# Patient Record
Sex: Female | Born: 1955 | Race: White | Hispanic: Yes | Marital: Married | Smoking: Never smoker
Health system: Southern US, Community
[De-identification: ages and names within clinical notes are randomized; demographics above are authoritative.]

## PROBLEM LIST (undated history)

## (undated) DIAGNOSIS — M069 Rheumatoid arthritis, unspecified: Secondary | ICD-10-CM

## (undated) DIAGNOSIS — I10 Essential (primary) hypertension: Secondary | ICD-10-CM

## (undated) DIAGNOSIS — I82409 Acute embolism and thrombosis of unspecified deep veins of unspecified lower extremity: Secondary | ICD-10-CM

## (undated) DIAGNOSIS — K219 Gastro-esophageal reflux disease without esophagitis: Secondary | ICD-10-CM

## (undated) DIAGNOSIS — F419 Anxiety disorder, unspecified: Secondary | ICD-10-CM

## (undated) DIAGNOSIS — E119 Type 2 diabetes mellitus without complications: Secondary | ICD-10-CM

## (undated) DIAGNOSIS — E7800 Pure hypercholesterolemia, unspecified: Secondary | ICD-10-CM

## (undated) HISTORY — PX: EYE SURGERY: SHX253

## (undated) HISTORY — PX: KNEE ARTHROPLASTY: SHX992

---

## 2004-05-17 ENCOUNTER — Ambulatory Visit (HOSPITAL_COMMUNITY): Admission: RE | Admit: 2004-05-17 | Discharge: 2004-05-17 | Payer: Self-pay | Admitting: Ophthalmology

## 2005-03-18 ENCOUNTER — Ambulatory Visit (HOSPITAL_COMMUNITY): Admission: RE | Admit: 2005-03-18 | Discharge: 2005-03-18 | Payer: Self-pay | Admitting: Family Medicine

## 2007-01-01 ENCOUNTER — Ambulatory Visit (HOSPITAL_COMMUNITY): Admission: RE | Admit: 2007-01-01 | Discharge: 2007-01-01 | Payer: Self-pay | Admitting: Family Medicine

## 2007-01-17 ENCOUNTER — Ambulatory Visit (HOSPITAL_COMMUNITY): Admission: RE | Admit: 2007-01-17 | Discharge: 2007-01-17 | Payer: Self-pay | Admitting: Family Medicine

## 2007-09-24 ENCOUNTER — Ambulatory Visit (HOSPITAL_COMMUNITY): Admission: RE | Admit: 2007-09-24 | Discharge: 2007-09-24 | Payer: Self-pay | Admitting: Family Medicine

## 2008-02-21 ENCOUNTER — Ambulatory Visit (HOSPITAL_COMMUNITY): Admission: RE | Admit: 2008-02-21 | Discharge: 2008-02-21 | Payer: Self-pay | Admitting: Family Medicine

## 2010-01-04 ENCOUNTER — Ambulatory Visit (HOSPITAL_COMMUNITY)
Admission: RE | Admit: 2010-01-04 | Discharge: 2010-01-04 | Payer: Self-pay | Source: Home / Self Care | Attending: Family Medicine | Admitting: Family Medicine

## 2010-01-25 ENCOUNTER — Encounter: Payer: Self-pay | Admitting: Family Medicine

## 2010-09-09 ENCOUNTER — Ambulatory Visit
Admission: RE | Admit: 2010-09-09 | Discharge: 2010-09-09 | Disposition: A | Discharge status: Home / Self Care | Payer: 59 | Source: Ambulatory Visit | Attending: Rheumatology | Admitting: Rheumatology

## 2010-09-09 ENCOUNTER — Other Ambulatory Visit: Payer: Self-pay | Admitting: Rheumatology

## 2010-09-09 DIAGNOSIS — M1990 Unspecified osteoarthritis, unspecified site: Secondary | ICD-10-CM

## 2013-02-15 ENCOUNTER — Other Ambulatory Visit (HOSPITAL_COMMUNITY): Payer: Self-pay | Admitting: Orthopedic Surgery

## 2013-02-22 ENCOUNTER — Encounter (HOSPITAL_COMMUNITY)
Admission: RE | Admit: 2013-02-22 | Discharge: 2013-02-22 | Disposition: A | Discharge status: Home / Self Care | Payer: 59 | Source: Ambulatory Visit | Attending: Orthopedic Surgery | Admitting: Orthopedic Surgery

## 2013-02-22 ENCOUNTER — Ambulatory Visit (HOSPITAL_COMMUNITY)
Admission: RE | Admit: 2013-02-22 | Discharge: 2013-02-22 | Disposition: A | Discharge status: Home / Self Care | Payer: 59 | Source: Ambulatory Visit | Attending: Orthopedic Surgery | Admitting: Orthopedic Surgery

## 2013-02-22 ENCOUNTER — Encounter (HOSPITAL_COMMUNITY): Payer: Self-pay

## 2013-02-22 DIAGNOSIS — Z01812 Encounter for preprocedural laboratory examination: Secondary | ICD-10-CM | POA: Insufficient documentation

## 2013-02-22 DIAGNOSIS — Z01818 Encounter for other preprocedural examination: Secondary | ICD-10-CM | POA: Insufficient documentation

## 2013-02-22 DIAGNOSIS — Z01810 Encounter for preprocedural cardiovascular examination: Secondary | ICD-10-CM | POA: Insufficient documentation

## 2013-02-22 HISTORY — DX: Essential (primary) hypertension: I10

## 2013-02-22 HISTORY — DX: Rheumatoid arthritis, unspecified: M06.9

## 2013-02-22 HISTORY — DX: Gastro-esophageal reflux disease without esophagitis: K21.9

## 2013-02-22 LAB — CBC
HCT: 35.1 % — ABNORMAL LOW (ref 36.0–46.0)
Hemoglobin: 11.5 g/dL — ABNORMAL LOW (ref 12.0–15.0)
MCH: 30.5 pg (ref 26.0–34.0)
MCHC: 32.8 g/dL (ref 30.0–36.0)
MCV: 93.1 fL (ref 78.0–100.0)
Platelets: 228 10*3/uL (ref 150–400)
RBC: 3.77 MIL/uL — ABNORMAL LOW (ref 3.87–5.11)
RDW: 14.1 % (ref 11.5–15.5)
WBC: 7.5 10*3/uL (ref 4.0–10.5)

## 2013-02-22 LAB — TYPE AND SCREEN
ABO/RH(D): O POS
Antibody Screen: NEGATIVE

## 2013-02-22 LAB — APTT: aPTT: 33 seconds (ref 24–37)

## 2013-02-22 LAB — COMPREHENSIVE METABOLIC PANEL
ALT: 16 U/L (ref 0–35)
AST: 17 U/L (ref 0–37)
Albumin: 3.6 g/dL (ref 3.5–5.2)
Alkaline Phosphatase: 107 U/L (ref 39–117)
BUN: 23 mg/dL (ref 6–23)
CO2: 26 mEq/L (ref 19–32)
Calcium: 9.4 mg/dL (ref 8.4–10.5)
Chloride: 103 mEq/L (ref 96–112)
Creatinine, Ser: 0.91 mg/dL (ref 0.50–1.10)
GFR calc Af Amer: 80 mL/min — ABNORMAL LOW (ref >90)
GFR calc non Af Amer: 69 mL/min — ABNORMAL LOW (ref >90)
Glucose, Bld: 110 mg/dL — ABNORMAL HIGH (ref 70–99)
Potassium: 3.9 mEq/L (ref 3.7–5.3)
Sodium: 143 mEq/L (ref 137–147)
Total Bilirubin: 0.2 mg/dL — ABNORMAL LOW (ref 0.3–1.2)
Total Protein: 7.3 g/dL (ref 6.0–8.3)

## 2013-02-22 LAB — PROTIME-INR
INR: 0.99 (ref 0.00–1.49)
Prothrombin Time: 12.9 seconds (ref 11.6–15.2)

## 2013-02-22 LAB — SURGICAL PCR SCREEN
MRSA, PCR: NEGATIVE
Staphylococcus aureus: NEGATIVE

## 2013-02-22 LAB — ABO/RH: ABO/RH(D): O POS

## 2013-02-22 NOTE — Progress Notes (Signed)
02/22/13 1423  OBSTRUCTIVE SLEEP APNEA  Have you ever been diagnosed with sleep apnea through a sleep study? No  Do you snore loudly (loud enough to be heard through closed doors)?  1  Do you often feel tired, fatigued, or sleepy during the daytime? 0  Has anyone observed you stop breathing during your sleep? 1  Do you have, or are you being treated for high blood pressure? 1  BMI more than 35 kg/m2? 1  Age over 58 years old? 1  Neck circumference greater than 40 cm/18 inches? 0  Gender: 0  Obstructive Sleep Apnea Score 5  Score 4 or greater  Results sent to PCP

## 2013-02-22 NOTE — Progress Notes (Signed)
Primary physician- dr. Earvin Hansen [facility] ([facility]) Ginette Otto Does not have a cardiologist No prior cardiac testing

## 2013-02-22 NOTE — Pre-Procedure Instructions (Signed)
Connie Ross  02/22/2013   Your procedure is scheduled on:  Wednesday, February 25th  Report to Admitting at 0630 AM.  Call this number if you have problems the morning of surgery: [phone]   Remember:   Do not eat food or drink liquids after midnight.   Take these medicines the morning of surgery with A SIP OF WATER:    Do not wear jewelry, make-up or nail polish.  Do not wear lotions, powders, or perfume,deodorant.  Do not shave 48 hours prior to surgery. Men may shave face and neck.  Do not bring valuables to the hospital.  [facility] is not responsible for any belongings or valuables.               Contacts, dentures or bridgework may not be worn into surgery.  Leave suitcase in the car. After surgery it may be brought to your room.  For patients admitted to the hospital, discharge time is determined by your  treatment team.    Please read over the following fact sheets that you were given: Pain Booklet, Coughing and Deep Breathing, Blood Transfusion Information, MRSA Information and Surgical Site Infection Prevention  [place] - Preparing for Surgery  Before surgery, you can play an important role.  Because skin is not sterile, your skin needs to be as free of germs as possible.  You can reduce the number of germs on you skin by washing with CHG (chlorahexidine gluconate) soap before surgery.  CHG is an antiseptic cleaner which kills germs and bonds with the skin to continue killing germs even after washing.  Please DO NOT use if you have an allergy to CHG or antibacterial soaps.  If your skin becomes reddened/irritated stop using the CHG and inform your nurse when you arrive at Short Stay.  Do not shave (including legs and underarms) for at least 48 hours prior to the first CHG shower.  You may shave your face.  Please follow these instructions carefully:   1.  Shower with CHG Soap the night before surgery and the morning of Surgery.  2.  If you choose to wash  your hair, wash your hair first as usual with your normal shampoo.  3.  After you shampoo, rinse your hair and body thoroughly to remove the shampoo.  4.  Use CHG as you would any other liquid soap.  You can apply CHG directly to the skin and wash gently with scrungie or a clean washcloth.  5.  Apply the CHG Soap to your body ONLY FROM THE NECK DOWN.  Do not use on open wounds or open sores.  Avoid contact with your eyes, ears, mouth and genitals (private parts).  Wash genitals (private parts) with your normal soap.  6.  Wash thoroughly, paying special attention to the area where your surgery will be performed.  7.  Thoroughly rinse your body with warm water from the neck down.  8.  DO NOT shower/wash with your normal soap after using and rinsing off the CHG Soap.  9.  Pat yourself dry with a clean towel.            10.  Wear clean pajamas.            11.  Place clean sheets on your bed the night of your first shower and do not sleep with pets.  Day of Surgery  Do not apply any lotions/deoderants the morning of surgery.  Please wear clean clothes to the hospital/surgery center.

## 2013-02-22 NOTE — Progress Notes (Signed)
To Whom It May Concern:  Connie Ross was here at [facility] Pre-Admission Testing from 2:00 PM to 3:00 PM today 02/22/13. She may return to work on 02/23/13 without any restrictions.  Ventura Sellers, RN [place] Pre-Admissions

## 2013-02-26 ENCOUNTER — Other Ambulatory Visit (HOSPITAL_COMMUNITY): Payer: 59

## 2013-02-26 MED ORDER — CEFAZOLIN SODIUM-DEXTROSE 2-3 GM-% IV SOLR
2.0000 g | INTRAVENOUS | Status: AC
  Administered 2013-02-27: 2 g via INTRAVENOUS
  Filled 2013-02-26: qty 50

## 2013-02-26 MED ORDER — CHLORHEXIDINE GLUCONATE 4 % EX LIQD
60.0000 mL | Freq: Once | CUTANEOUS | Status: DC
  Filled 2013-02-26: qty 60

## 2013-02-27 ENCOUNTER — Encounter (HOSPITAL_COMMUNITY): Payer: Self-pay | Admitting: *Deleted

## 2013-02-27 ENCOUNTER — Encounter (HOSPITAL_COMMUNITY)
Admission: RE | Disposition: A | Discharge status: Home Health | Payer: Self-pay | Source: Ambulatory Visit | Attending: Orthopedic Surgery

## 2013-02-27 ENCOUNTER — Encounter (HOSPITAL_COMMUNITY): Payer: 59 | Admitting: Anesthesiology

## 2013-02-27 ENCOUNTER — Inpatient Hospital Stay (HOSPITAL_COMMUNITY)
Admission: RE | Admit: 2013-02-27 | Discharge: 2013-03-02 | DRG: 470 | Disposition: A | Discharge status: Home Health | Payer: 59 | Source: Ambulatory Visit | Attending: Orthopedic Surgery | Admitting: Orthopedic Surgery

## 2013-02-27 ENCOUNTER — Inpatient Hospital Stay (HOSPITAL_COMMUNITY): Payer: 59 | Admitting: Anesthesiology

## 2013-02-27 DIAGNOSIS — Z7982 Long term (current) use of aspirin: Secondary | ICD-10-CM

## 2013-02-27 DIAGNOSIS — M069 Rheumatoid arthritis, unspecified: Secondary | ICD-10-CM | POA: Diagnosis present

## 2013-02-27 DIAGNOSIS — Z96653 Presence of artificial knee joint, bilateral: Secondary | ICD-10-CM

## 2013-02-27 DIAGNOSIS — I10 Essential (primary) hypertension: Secondary | ICD-10-CM | POA: Diagnosis present

## 2013-02-27 DIAGNOSIS — K219 Gastro-esophageal reflux disease without esophagitis: Secondary | ICD-10-CM | POA: Diagnosis present

## 2013-02-27 DIAGNOSIS — Z79899 Other long term (current) drug therapy: Secondary | ICD-10-CM

## 2013-02-27 DIAGNOSIS — E119 Type 2 diabetes mellitus without complications: Secondary | ICD-10-CM | POA: Diagnosis present

## 2013-02-27 DIAGNOSIS — Z96659 Presence of unspecified artificial knee joint: Secondary | ICD-10-CM

## 2013-02-27 DIAGNOSIS — M1710 Unilateral primary osteoarthritis, unspecified knee: Principal | ICD-10-CM | POA: Diagnosis present

## 2013-02-27 HISTORY — PX: TOTAL KNEE ARTHROPLASTY: SHX125

## 2013-02-27 HISTORY — DX: Type 2 diabetes mellitus without complications: E11.9

## 2013-02-27 HISTORY — DX: Pure hypercholesterolemia, unspecified: E78.00

## 2013-02-27 LAB — GLUCOSE, CAPILLARY
Glucose-Capillary: 101 mg/dL — ABNORMAL HIGH (ref 70–99)
Glucose-Capillary: 116 mg/dL — ABNORMAL HIGH (ref 70–99)
Glucose-Capillary: 130 mg/dL — ABNORMAL HIGH (ref 70–99)
Glucose-Capillary: 124 mg/dL — ABNORMAL HIGH (ref 70–99)

## 2013-02-27 SURGERY — ARTHROPLASTY, KNEE, TOTAL
Anesthesia: Regional | Site: Knee | Laterality: Right

## 2013-02-27 MED ORDER — SODIUM CHLORIDE 0.9 % IJ SOLN
INTRAMUSCULAR | Status: DC | PRN
  Administered 2013-02-27: 10:00:00

## 2013-02-27 MED ORDER — HYDROCHLOROTHIAZIDE 12.5 MG PO CAPS
12.5000 mg | ORAL_CAPSULE | Freq: Every day | ORAL | Status: DC
  Administered 2013-02-27 – 2013-03-02 (×4): 12.5 mg via ORAL
  Filled 2013-02-27 (×4): qty 1

## 2013-02-27 MED ORDER — OXYCODONE HCL 5 MG PO TABS
5.0000 mg | ORAL_TABLET | ORAL | Status: DC | PRN
  Administered 2013-02-27 – 2013-03-02 (×14): 10 mg via ORAL
  Filled 2013-02-27 (×14): qty 2

## 2013-02-27 MED ORDER — MENTHOL 3 MG MT LOZG: 1.0000 | LOZENGE | OROMUCOSAL | Status: DC | PRN

## 2013-02-27 MED ORDER — ONDANSETRON HCL 4 MG/2ML IJ SOLN: 4.0000 mg | Freq: Four times a day (QID) | INTRAMUSCULAR | Status: DC | PRN

## 2013-02-27 MED ORDER — BUPIVACAINE LIPOSOME 1.3 % IJ SUSP
20.0000 mL | INTRAMUSCULAR | Status: DC
Start: 2013-02-27 — End: 2013-02-27
  Filled 2013-02-27: qty 20

## 2013-02-27 MED ORDER — METFORMIN HCL 500 MG PO TABS
1000.0000 mg | ORAL_TABLET | Freq: Every day | ORAL | Status: DC
  Administered 2013-02-27 – 2013-03-02 (×4): 1000 mg via ORAL
  Filled 2013-02-27 (×5): qty 2

## 2013-02-27 MED ORDER — LACTATED RINGERS IV SOLN
INTRAVENOUS | Status: DC | PRN
  Administered 2013-02-27 (×2): via INTRAVENOUS

## 2013-02-27 MED ORDER — SUCCINYLCHOLINE CHLORIDE 20 MG/ML IJ SOLN
INTRAMUSCULAR | Status: AC
  Filled 2013-02-27: qty 1

## 2013-02-27 MED ORDER — MIDAZOLAM HCL 5 MG/5ML IJ SOLN
INTRAMUSCULAR | Status: DC | PRN
  Administered 2013-02-27 (×2): 1 mg via INTRAVENOUS

## 2013-02-27 MED ORDER — ONDANSETRON HCL 4 MG PO TABS: 4.0000 mg | ORAL_TABLET | Freq: Four times a day (QID) | ORAL | Status: DC | PRN

## 2013-02-27 MED ORDER — ARTIFICIAL TEARS OP OINT
TOPICAL_OINTMENT | OPHTHALMIC | Status: DC | PRN
  Administered 2013-02-27: 1 via OPHTHALMIC

## 2013-02-27 MED ORDER — MIDAZOLAM HCL 2 MG/2ML IJ SOLN
INTRAMUSCULAR | Status: AC
  Filled 2013-02-27: qty 2

## 2013-02-27 MED ORDER — PROPOFOL 10 MG/ML IV BOLUS
INTRAVENOUS | Status: DC | PRN
Start: 2013-02-27 — End: 2013-02-27
  Administered 2013-02-27: 180 mg via INTRAVENOUS

## 2013-02-27 MED ORDER — ROCURONIUM BROMIDE 100 MG/10ML IV SOLN
INTRAVENOUS | Status: DC | PRN
  Administered 2013-02-27: 50 mg via INTRAVENOUS

## 2013-02-27 MED ORDER — ACETAMINOPHEN 650 MG RE SUPP: 650.0000 mg | Freq: Four times a day (QID) | RECTAL | Status: DC | PRN

## 2013-02-27 MED ORDER — ONDANSETRON HCL 4 MG/2ML IJ SOLN
INTRAMUSCULAR | Status: AC
  Filled 2013-02-27: qty 2

## 2013-02-27 MED ORDER — ACETAMINOPHEN 325 MG PO TABS
650.0000 mg | ORAL_TABLET | Freq: Four times a day (QID) | ORAL | Status: DC | PRN
  Administered 2013-02-28 – 2013-03-01 (×2): 650 mg via ORAL
  Filled 2013-02-27 (×2): qty 2

## 2013-02-27 MED ORDER — SODIUM CHLORIDE 0.9 % IR SOLN
Status: DC | PRN
  Administered 2013-02-27 (×2): 1000 mL

## 2013-02-27 MED ORDER — BUPIVACAINE-EPINEPHRINE PF 0.5-1:200000 % IJ SOLN
INTRAMUSCULAR | Status: DC | PRN
  Administered 2013-02-27: 20 mL

## 2013-02-27 MED ORDER — OXYCODONE HCL 5 MG PO TABS
ORAL_TABLET | ORAL | Status: AC
  Filled 2013-02-27: qty 2

## 2013-02-27 MED ORDER — HYDROXYCHLOROQUINE SULFATE 200 MG PO TABS
200.0000 mg | ORAL_TABLET | Freq: Two times a day (BID) | ORAL | Status: DC
  Administered 2013-02-27 – 2013-03-02 (×7): 200 mg via ORAL
  Filled 2013-02-27 (×9): qty 1

## 2013-02-27 MED ORDER — HYDROMORPHONE HCL PF 1 MG/ML IJ SOLN
1.0000 mg | INTRAMUSCULAR | Status: DC | PRN
  Administered 2013-02-27 – 2013-03-01 (×4): 1 mg via INTRAVENOUS
  Filled 2013-02-27 (×4): qty 1

## 2013-02-27 MED ORDER — LOSARTAN POTASSIUM 50 MG PO TABS
50.0000 mg | ORAL_TABLET | Freq: Every day | ORAL | Status: DC
  Administered 2013-02-27 – 2013-03-02 (×4): 50 mg via ORAL
  Filled 2013-02-27 (×4): qty 1

## 2013-02-27 MED ORDER — SODIUM CHLORIDE 0.9 % IJ SOLN
INTRAMUSCULAR | Status: AC
  Filled 2013-02-27: qty 12

## 2013-02-27 MED ORDER — FENTANYL CITRATE 0.05 MG/ML IJ SOLN
INTRAMUSCULAR | Status: AC
  Filled 2013-02-27: qty 5

## 2013-02-27 MED ORDER — SODIUM CHLORIDE 0.9 % IV SOLN
INTRAVENOUS | Status: DC
  Administered 2013-02-27: 15:00:00 via INTRAVENOUS

## 2013-02-27 MED ORDER — NEOSTIGMINE METHYLSULFATE 1 MG/ML IJ SOLN
INTRAMUSCULAR | Status: AC
  Filled 2013-02-27: qty 10

## 2013-02-27 MED ORDER — HYDROMORPHONE HCL PF 1 MG/ML IJ SOLN
0.2500 mg | INTRAMUSCULAR | Status: DC | PRN
  Administered 2013-02-27 (×4): 0.5 mg via INTRAVENOUS

## 2013-02-27 MED ORDER — CEFAZOLIN SODIUM 1-5 GM-% IV SOLN
1.0000 g | Freq: Four times a day (QID) | INTRAVENOUS | Status: AC
  Administered 2013-02-27 (×2): 1 g via INTRAVENOUS
  Filled 2013-02-27 (×2): qty 50

## 2013-02-27 MED ORDER — PANTOPRAZOLE SODIUM 40 MG PO TBEC
40.0000 mg | DELAYED_RELEASE_TABLET | Freq: Every day | ORAL | Status: DC
  Administered 2013-02-27 – 2013-03-02 (×4): 40 mg via ORAL
  Filled 2013-02-27 (×4): qty 1

## 2013-02-27 MED ORDER — GLYCOPYRROLATE 0.2 MG/ML IJ SOLN
INTRAMUSCULAR | Status: DC | PRN
  Administered 2013-02-27: 0.4 mg via INTRAVENOUS

## 2013-02-27 MED ORDER — ROCURONIUM BROMIDE 50 MG/5ML IV SOLN
INTRAVENOUS | Status: AC
  Filled 2013-02-27: qty 1

## 2013-02-27 MED ORDER — LACTATED RINGERS IV SOLN: INTRAVENOUS | Status: DC

## 2013-02-27 MED ORDER — PROPOFOL 10 MG/ML IV BOLUS
INTRAVENOUS | Status: AC
  Filled 2013-02-27: qty 20

## 2013-02-27 MED ORDER — ONDANSETRON HCL 4 MG/2ML IJ SOLN
INTRAMUSCULAR | Status: DC | PRN
  Administered 2013-02-27: 4 mg via INTRAVENOUS

## 2013-02-27 MED ORDER — EPHEDRINE SULFATE 50 MG/ML IJ SOLN
INTRAMUSCULAR | Status: AC
  Filled 2013-02-27: qty 1

## 2013-02-27 MED ORDER — NEOSTIGMINE METHYLSULFATE 1 MG/ML IJ SOLN
INTRAMUSCULAR | Status: DC | PRN
  Administered 2013-02-27: 3 mg via INTRAVENOUS

## 2013-02-27 MED ORDER — PHENYLEPHRINE 40 MCG/ML (10ML) SYRINGE FOR IV PUSH (FOR BLOOD PRESSURE SUPPORT)
PREFILLED_SYRINGE | INTRAVENOUS | Status: AC
  Filled 2013-02-27: qty 10

## 2013-02-27 MED ORDER — ARTIFICIAL TEARS OP OINT
TOPICAL_OINTMENT | OPHTHALMIC | Status: AC
  Filled 2013-02-27: qty 3.5

## 2013-02-27 MED ORDER — GLYCOPYRROLATE 0.2 MG/ML IJ SOLN
INTRAMUSCULAR | Status: AC
  Filled 2013-02-27: qty 2

## 2013-02-27 MED ORDER — FENTANYL CITRATE 0.05 MG/ML IJ SOLN
INTRAMUSCULAR | Status: DC | PRN
  Administered 2013-02-27 (×3): 50 ug via INTRAVENOUS
  Administered 2013-02-27: 100 ug via INTRAVENOUS

## 2013-02-27 MED ORDER — HYDROMORPHONE HCL PF 1 MG/ML IJ SOLN
INTRAMUSCULAR | Status: AC
  Administered 2013-02-27: 0.5 mg via INTRAVENOUS
  Filled 2013-02-27: qty 2

## 2013-02-27 MED ORDER — LOSARTAN POTASSIUM-HCTZ 50-12.5 MG PO TABS: 1.0000 | ORAL_TABLET | Freq: Every day | ORAL | Status: DC

## 2013-02-27 MED ORDER — METOCLOPRAMIDE HCL 10 MG PO TABS: 5.0000 mg | ORAL_TABLET | Freq: Three times a day (TID) | ORAL | Status: DC | PRN

## 2013-02-27 MED ORDER — 0.9 % SODIUM CHLORIDE (POUR BTL) OPTIME
TOPICAL | Status: DC | PRN
  Administered 2013-02-27: 1000 mL

## 2013-02-27 MED ORDER — PHENOL 1.4 % MT LIQD: 1.0000 | OROMUCOSAL | Status: DC | PRN

## 2013-02-27 MED ORDER — LINAGLIPTIN 5 MG PO TABS
5.0000 mg | ORAL_TABLET | Freq: Every day | ORAL | Status: DC
  Administered 2013-02-27 – 2013-03-02 (×4): 5 mg via ORAL
  Filled 2013-02-27 (×5): qty 1

## 2013-02-27 MED ORDER — INSULIN ASPART 100 UNIT/ML ~~LOC~~ SOLN
0.0000 [IU] | Freq: Three times a day (TID) | SUBCUTANEOUS | Status: DC
  Administered 2013-02-27 – 2013-03-01 (×6): 2 [IU] via SUBCUTANEOUS

## 2013-02-27 MED ORDER — METOCLOPRAMIDE HCL 5 MG/ML IJ SOLN: 5.0000 mg | Freq: Three times a day (TID) | INTRAMUSCULAR | Status: DC | PRN

## 2013-02-27 MED ORDER — SAXAGLIPTIN-METFORMIN ER 2.5-1000 MG PO TB24: 1.0000 | ORAL_TABLET | Freq: Every day | ORAL | Status: DC

## 2013-02-27 MED ORDER — ASPIRIN EC 325 MG PO TBEC
325.0000 mg | DELAYED_RELEASE_TABLET | Freq: Every day | ORAL | Status: DC
  Administered 2013-02-28 – 2013-03-02 (×3): 325 mg via ORAL
  Filled 2013-02-27 (×4): qty 1

## 2013-02-27 MED ORDER — PROMETHAZINE HCL 25 MG/ML IJ SOLN: 6.2500 mg | INTRAMUSCULAR | Status: DC | PRN

## 2013-02-27 MED ORDER — MEPERIDINE HCL 25 MG/ML IJ SOLN: 6.2500 mg | INTRAMUSCULAR | Status: DC | PRN

## 2013-02-27 SURGICAL SUPPLY — 51 items
BANDAGE GAUZE ELAST BULKY 4 IN (GAUZE/BANDAGES/DRESSINGS) ×2 IMPLANT
BLADE SAGITTAL 25.0X1.27X90 (BLADE) ×2 IMPLANT
BLADE SAW SGTL 13.0X1.19X90.0M (BLADE) ×2 IMPLANT
BLADE SURG 21 STRL SS (BLADE) ×4 IMPLANT
BNDG COHESIVE 6X5 TAN STRL LF (GAUZE/BANDAGES/DRESSINGS) ×2 IMPLANT
BONE CEMENT PALACOSE (Orthopedic Implant) ×4 IMPLANT
BOWL SMART MIX CTS (DISPOSABLE) IMPLANT
CAP POR TM CP VIT E LN CER HD ×2 IMPLANT
CEMENT BONE PALACOSE (Orthopedic Implant) ×2 IMPLANT
CLOTH BEACON ORANGE TIMEOUT ST (SAFETY) ×2 IMPLANT
COVER SURGICAL LIGHT HANDLE (MISCELLANEOUS) ×2 IMPLANT
CUFF TOURNIQUET SINGLE 34IN LL (TOURNIQUET CUFF) IMPLANT
CUFF TOURNIQUET SINGLE 44IN (TOURNIQUET CUFF) IMPLANT
DRAPE EXTREMITY T 121X128X90 (DRAPE) ×2 IMPLANT
DRAPE PROXIMA HALF (DRAPES) ×2 IMPLANT
DRAPE U-SHAPE 47X51 STRL (DRAPES) ×2 IMPLANT
DRSG ADAPTIC 3X8 NADH LF (GAUZE/BANDAGES/DRESSINGS) ×2 IMPLANT
DRSG PAD ABDOMINAL 8X10 ST (GAUZE/BANDAGES/DRESSINGS) ×2 IMPLANT
DURAPREP 26ML APPLICATOR (WOUND CARE) ×2 IMPLANT
ELECT REM PT RETURN 9FT ADLT (ELECTROSURGICAL) ×2
ELECTRODE REM PT RTRN 9FT ADLT (ELECTROSURGICAL) ×1 IMPLANT
FACESHIELD LNG OPTICON STERILE (SAFETY) ×2 IMPLANT
GLOVE BIOGEL PI IND STRL 9 (GLOVE) ×1 IMPLANT
GLOVE BIOGEL PI INDICATOR 9 (GLOVE) ×1
GLOVE SURG ORTHO 9.0 STRL STRW (GLOVE) ×2 IMPLANT
GOWN PREVENTION PLUS XLARGE (GOWN DISPOSABLE) ×2 IMPLANT
GOWN SRG XL XLNG 56XLVL 4 (GOWN DISPOSABLE) ×2 IMPLANT
GOWN STRL NON-REIN XL XLG LVL4 (GOWN DISPOSABLE) ×2
HANDPIECE INTERPULSE COAX TIP (DISPOSABLE)
KIT BASIN OR (CUSTOM PROCEDURE TRAY) ×2 IMPLANT
KIT ROOM TURNOVER OR (KITS) ×2 IMPLANT
MANIFOLD NEPTUNE II (INSTRUMENTS) ×2 IMPLANT
NEEDLE SPNL 18GX3.5 QUINCKE PK (NEEDLE) ×2 IMPLANT
NS IRRIG 1000ML POUR BTL (IV SOLUTION) ×2 IMPLANT
PACK TOTAL JOINT (CUSTOM PROCEDURE TRAY) ×2 IMPLANT
PAD ABD 8X10 STRL (GAUZE/BANDAGES/DRESSINGS) ×2 IMPLANT
PAD ARMBOARD 7.5X6 YLW CONV (MISCELLANEOUS) ×4 IMPLANT
PADDING CAST COTTON 6X4 STRL (CAST SUPPLIES) ×2 IMPLANT
SET HNDPC FAN SPRY TIP SCT (DISPOSABLE) IMPLANT
SPONGE GAUZE 4X4 12PLY (GAUZE/BANDAGES/DRESSINGS) ×2 IMPLANT
STAPLER VISISTAT 35W (STAPLE) ×2 IMPLANT
SUCTION FRAZIER TIP 10 FR DISP (SUCTIONS) IMPLANT
SUT VIC AB 0 CTB1 27 (SUTURE) IMPLANT
SUT VIC AB 1 CTX 36 (SUTURE)
SUT VIC AB 1 CTX36XBRD ANBCTR (SUTURE) IMPLANT
SYR 50ML LL SCALE MARK (SYRINGE) ×2 IMPLANT
TOWEL OR 17X24 6PK STRL BLUE (TOWEL DISPOSABLE) ×2 IMPLANT
TOWEL OR 17X26 10 PK STRL BLUE (TOWEL DISPOSABLE) ×2 IMPLANT
TRAY FOLEY CATH 16FRSI W/METER (SET/KITS/TRAYS/PACK) IMPLANT
WATER STERILE IRR 1000ML POUR (IV SOLUTION) ×4 IMPLANT
WRAP KNEE MAXI GEL POST OP (GAUZE/BANDAGES/DRESSINGS) ×2 IMPLANT

## 2013-02-27 NOTE — Transfer of Care (Signed)
Immediate Anesthesia Transfer of Care Note  Patient: Connie Ross  Procedure(s) Performed: Procedure(s) with comments: TOTAL KNEE ARTHROPLASTY- knee (Right) - Right Total Knee Arthroplasty  Patient Location: PACU  Anesthesia Type:GA combined with regional for post-op pain  Level of Consciousness: awake, alert  and oriented  Airway & Oxygen Therapy: Patient Spontanous Breathing and Patient connected to face mask oxygen  Post-op Assessment: Report given to PACU RN  Post vital signs: Reviewed and stable  Complications: No apparent anesthesia complications

## 2013-02-27 NOTE — Progress Notes (Signed)
Orthopedic Tech Progress Note Patient Details:  Connie Ross 09/08/1955 [id number]  Patient ID: Connie Ross, female   DOB: 1955/03/23, 58 y.o.   MRN: [id number]   [name] 02/27/2013, 2:33 PMFoot roll

## 2013-02-27 NOTE — Preoperative (Signed)
Beta Blockers   Reason not to administer Beta Blockers:Not Applicable 

## 2013-02-27 NOTE — Anesthesia Preprocedure Evaluation (Addendum)
Anesthesia Evaluation  Patient identified by MRN, date of birth, ID band Patient awake    Reviewed: Allergy & Precautions, H&P , NPO status , Patient's Chart, lab work & pertinent test results  Airway Mallampati: II TM Distance: >3 FB Neck ROM: Full  Mouth opening: Limited Mouth Opening  Dental no notable dental hx. (+) Edentulous Upper, Poor Dentition   Pulmonary  breath sounds clear to auscultation  Pulmonary exam normal       Cardiovascular hypertension, Pt. on medications Rhythm:Regular Rate:Normal     Neuro/Psych    GI/Hepatic GERD-  Medicated,  Endo/Other  diabetes, Type 2, Oral Hypoglycemic Agents  Renal/GU      Musculoskeletal  (+) Arthritis -, Rheumatoid disorders,    Abdominal Normal abdominal exam  (+)   Peds  Hematology   Anesthesia Other Findings   Reproductive/Obstetrics                         Anesthesia Physical Anesthesia Plan  ASA: III  Anesthesia Plan: General and Regional   Post-op Pain Management:    Induction: Intravenous  Airway Management Planned: Oral ETT and LMA  Additional Equipment:   Intra-op Plan:   Post-operative Plan: Extubation in OR  Informed Consent: I have reviewed the patients History and Physical, chart, labs and discussed the procedure including the risks, benefits and alternatives for the proposed anesthesia with the patient or authorized representative who has indicated his/her understanding and acceptance.   Dental advisory given  Plan Discussed with: CRNA, Anesthesiologist and Surgeon  Anesthesia Plan Comments: (FNB)       Anesthesia Quick Evaluation

## 2013-02-27 NOTE — Progress Notes (Signed)
Pt. Request her daughter, Kathreen Devoid , to act as her interpreter today. The release of responsibility for interpretation form has been completed and signed.

## 2013-02-27 NOTE — Anesthesia Postprocedure Evaluation (Signed)
  Anesthesia Post-op Note  Patient: Connie Ross  Procedure(s) Performed: Procedure(s) (LRB): TOTAL KNEE ARTHROPLASTY- knee (Right)  Patient Location: PACU  Anesthesia Type: GA combined with regional for post-op pain  Level of Consciousness: awake and alert   Airway and Oxygen Therapy: Patient Spontanous Breathing  Post-op Pain: mild  Post-op Assessment: Post-op Vital signs reviewed, Patient's Cardiovascular Status Stable, Respiratory Function Stable, Patent Airway and No signs of Nausea or vomiting  Last Vitals:  Filed Vitals:   02/27/13 1200  BP: 119/64  Pulse: 62  Temp: 36.6 C  Resp: 15    Post-op Vital Signs: stable   Complications: No apparent anesthesia complications

## 2013-02-27 NOTE — Plan of Care (Signed)
Problem: Consults Goal: Diagnosis- Total Joint Replacement Primary Total Knee Right     

## 2013-02-27 NOTE — H&P (Signed)
TOTAL KNEE ADMISSION H&P  Patient is being admitted for right total knee arthroplasty.  Subjective:  Chief Complaint:right knee pain.  HPI: Connie Ross, 58 y.o. female, has a history of pain and functional disability in the right knee due to arthritis and has failed non-surgical conservative treatments for greater than 12 weeks to includeNSAID's and/or analgesics, corticosteriod injections, viscosupplementation injections, use of assistive devices, weight reduction as appropriate and activity modification.  Onset of symptoms was gradual, starting 8 years ago with gradually worsening course since that time. The patient noted no past surgery on the right knee(s).  Patient currently rates pain in the right knee(s) at 8 out of 10 with activity. Patient has night pain, worsening of pain with activity and weight bearing, pain that interferes with activities of daily living, pain with passive range of motion, crepitus and joint swelling.  Patient has evidence of subchondral cysts, subchondral sclerosis, periarticular osteophytes and joint space narrowing by imaging studies. This patient has had Failure of conservative care. There is no active infection.  There are no active problems to display for this patient.  Past Medical History  Diagnosis Date  . Hypertension   . Diabetes mellitus without complication   . GERD (gastroesophageal reflux disease)   . Rheumatoid arthritis     Past Surgical History  Procedure Laterality Date  . No past surgeries      Prescriptions prior to admission  Medication Sig Dispense Refill  . acetaminophen (TYLENOL) 500 MG tablet Take 500 mg by mouth every 6 (six) hours as needed for mild pain.      Marland Kitchen aspirin 81 MG tablet Take 81 mg by mouth daily.      . Cholecalciferol (VITAMIN D3) 50000 UNITS CAPS Take 1 tablet by mouth daily.      Marland Kitchen esomeprazole (NEXIUM) 20 MG capsule Take 20 mg by mouth daily at 12 noon.      Marland Kitchen HYDROcodone-ibuprofen (VICOPROFEN) 7.5-200  MG per tablet Take 1 tablet by mouth every 6 (six) hours as needed for moderate pain.      . hydroxychloroquine (PLAQUENIL) 200 MG tablet Take 200 mg by mouth 2 (two) times daily.      Marland Kitchen losartan-hydrochlorothiazide (HYZAAR) 50-12.5 MG per tablet Take 1 tablet by mouth daily.      . methotrexate (RHEUMATREX) 2.5 MG tablet Take 2.5 mg by mouth once a week. Caution:Chemotherapy. Protect from light.. Take 6 tabs weekly      . Saxagliptin-Metformin 2.05-998 MG TB24 Take 1 tablet by mouth daily.       No Known Allergies  History  Substance Use Topics  . Smoking status: Never Smoker   . Smokeless tobacco: Not on file  . Alcohol Use: No    History reviewed. No pertinent family history.   Review of Systems  All other systems reviewed and are negative.    Objective:  Physical Exam  Vital signs in last 24 hours: Temp:  [99.7 F (37.6 C)] 99.7 F (37.6 C) (02/25 0718) Pulse Rate:  [76] 76 (02/25 0718) Resp:  [20] 20 (02/25 0718) BP: (146)/(70) 146/70 mmHg (02/25 0718) SpO2:  [97 %] 97 % (02/25 0718)  Labs:   There is no weight on file to calculate BMI.   Imaging Review Plain radiographs demonstrate moderate degenerative joint disease of the right knee(s). The overall alignment ismild varus. The bone quality appears to be adequate for age and reported activity level.  Assessment/Plan:  End stage arthritis, right knee   The patient history, physical examination,  clinical judgment of the provider and imaging studies are consistent with end stage degenerative joint disease of the right knee(s) and total knee arthroplasty is deemed medically necessary. The treatment options including medical management, injection therapy arthroscopy and arthroplasty were discussed at length. The risks and benefits of total knee arthroplasty were presented and reviewed. The risks due to aseptic loosening, infection, stiffness, patella tracking problems, thromboembolic complications and other  imponderables were discussed. The patient acknowledged the explanation, agreed to proceed with the plan and consent was signed. Patient is being admitted for inpatient treatment for surgery, pain control, PT, OT, prophylactic antibiotics, VTE prophylaxis, progressive ambulation and ADL's and discharge planning. The patient is planning to be discharged home with home health services

## 2013-02-27 NOTE — Evaluation (Signed)
Physical Therapy Evaluation Patient Details Name: Connie Ross MRN: [id number] DOB: December 24, 1955 Today's Date: 02/27/2013 Time: 7782-4235 PT Time Calculation (min): 32 min  PT Assessment / Plan / Recommendation History of Present Illness  RTKA  Clinical Impression  Pt is s/p TKA resulting in the deficits listed below (see PT Problem List).  Pt will benefit from skilled PT to increase their independence and safety with mobility to allow discharge to the venue listed below.      PT Assessment  Patient needs continued PT services    Follow Up Recommendations  Home health PT;Supervision/Assistance - 24 hour    Does the patient have the potential to tolerate intense rehabilitation      Barriers to Discharge        Equipment Recommendations  Rolling walker with 5" wheels;3in1 (PT)    Recommendations for Other Services OT consult   Frequency 7X/week    Precautions / Restrictions Precautions Precautions: Knee Precaution Comments: Pt and daughter educated in importance of knee healing fully straight Restrictions Weight Bearing Restrictions: Yes RLE Weight Bearing: Weight bearing as tolerated   Pertinent Vitals/Pain 6/10 R knee post amb patient repositioned for comfort and optimal knee ext       Mobility  Bed Mobility Overal bed mobility: Needs Assistance Bed Mobility: Supine to Sit Supine to sit: Mod assist General bed mobility comments: Cues for technqiue, mod assist for scooting and to ease hips closer to eOB Transfers Overall transfer level: Needs assistance Equipment used: Rolling walker (2 wheeled) Transfers: Sit to/from Stand Sit to Stand: +2 physical assistance;Mod assist General transfer comment: Cues for safety and hand placement Ambulation/Gait Ambulation/Gait assistance: +2 physical assistance;Min assist Ambulation Distance (Feet): 4 Feet Assistive device: Rolling walker (2 wheeled) Gait Pattern/deviations: Step-to pattern General Gait Details:  Step-by-step cues for sequence and to activate R quad for stance stability    Exercises Total Joint Exercises Quad Sets: AROM;Right;5 reps Knee Flexion: AAROM;Right;Seated (3)   PT Diagnosis: Difficulty walking;Acute pain  PT Problem List: Decreased strength;Decreased range of motion;Decreased activity tolerance;Decreased balance;Decreased mobility;Decreased knowledge of use of DME;Pain;Decreased knowledge of precautions PT Treatment Interventions: DME instruction;Gait training;Stair training;Functional mobility training;Therapeutic activities;Therapeutic exercise;Patient/family education     PT Goals(Current goals can be found in the care plan section) Acute Rehab PT Goals Patient Stated Goal: back to work PT Goal Formulation: With patient Time For Goal Achievement: 03/06/13 Potential to Achieve Goals: Good  Visit Information  Last PT Received On: 02/27/13 Assistance Needed: +1 (+2 will be helpful for first progressive amb) History of Present Illness: RTKA       Prior Functioning  Home Living Family/patient expects to be discharged to:: Private residence Living Arrangements: Spouse/significant other Available Help at Discharge: Family;Available 24 hours/day Type of Home: House Home Access: Stairs to enter [employer] of Steps: 5 Entrance Stairs-Rails: Right;Left;Can reach both Home Layout: One level Home Equipment: Cane - single point Prior Function Level of Independence: Independent Communication Communication: Prefers language other than English    Cognition  Cognition Arousal/Alertness: Awake/alert Behavior During Therapy: WFL for tasks assessed/performed Overall Cognitive Status: Within Functional Limits for tasks assessed    Extremity/Trunk Assessment Upper Extremity Assessment Upper Extremity Assessment: Overall WFL for tasks assessed Lower Extremity Assessment Lower Extremity Assessment: RLE deficits/detail RLE Deficits / Details: Decr AROM and  strength postop   Balance    End of Session PT - End of Session Equipment Utilized During Treatment: Gait belt Activity Tolerance: Patient tolerated treatment well Patient left: in chair;with call bell/phone within  reach;with family/visitor present Nurse Communication: Mobility status  GP     Olen Pel [place], [place] [phone]  02/27/2013, 4:57 PM

## 2013-02-27 NOTE — Op Note (Signed)
OPERATIVE REPORT  DATE OF SURGERY: 02/27/2013  PATIENT:  Connie Ross,  58 y.o. female  PRE-OPERATIVE DIAGNOSIS:  Osteoarthritis Right Knee  POST-OPERATIVE DIAGNOSIS:  Osteoarthritis Right Knee  PROCEDURE:  Procedure(s): TOTAL KNEE ARTHROPLASTY- knee Zimmer components size E. tibia size 5 femur size 29 patella 16 mm tray  SURGEON:  Surgeon(s): Nadara Mustard, MD  ANESTHESIA:   regional and general  EBL:  Minimal ML  SPECIMEN:  No Specimen  TOURNIQUET:   Total Tourniquet Time Documented: Thigh (Right) - 41 minutes Total: Thigh (Right) - 41 minutes   PROCEDURE DETAILS: Patient is a 58 year old woman with tricompartmental osteoarthritis of her right knee she has pain with activities of daily living she has failed conservative care and presents at this time for total knee arthroplasty. Risks and benefits were discussed including infection neurovascular injury pain DVT pulmonary embolus need for additional surgery. Patient states she understands and wished to proceed at this time. Description of procedure patient was brought to the operating room after femoral block she then underwent a general anesthetic after adequate levels of anesthesia were obtained patient's right lower extremity was prepped using DuraPrep draped in the sterile field an Ioban was used to cover all exposed skin. A midline incision was made and carried down to a medial parapatellar retinacular incision. The patella was everted the canal was opened with a drill and the instrument was set on 5 of valgus with 10 mm of the distal femur resected. Attention was then focused on the tibia and tibia was set for a neutral varus valgus and 10 mm was taken off the tibia. The tibia was sized for a size E. and the keel punch was then made for the size E. Attention was then focused on the femur this sized for a size 5 box cut and chamfer cuts were made for the size 5 femur. Trial implants were tried and patient a good stable  knee with a 16 mm polyethylene tray. The patella was resurfaced 4 a size 29. The wound is irrigated with pulsatile lavage the popliteal fossa was injected with Exparel. The cement was mixed the tibial and femoral components were cemented in place with cement was removed patella was also cemented clamped. The 16 mm spacer was placed in the knee was left in extension until the cement hardened the knee was irrigated with pulsatile lavage. The knee was then placed the range of motion and the patella tracked midline. The retinaculum was closed using #1 Vicryl. Subcutaneous was closed using 0 Vicryl the skin was closed using staples. The wound is covered Adaptic orthopedic sponges AB dressing Kerlix and Coban. Patient was extubated taken to the PACU in stable condition.  PLAN OF CARE: Admit to inpatient   PATIENT DISPOSITION:  PACU - hemodynamically stable.   Nadara Mustard, MD 02/27/2013 12:29 PM

## 2013-02-27 NOTE — Anesthesia Procedure Notes (Signed)
Anesthesia Regional Block:  Femoral nerve block  Pre-Anesthetic Checklist: ,, timeout performed, Correct Patient, Correct Site, Correct Laterality, Correct Procedure, Correct Position, site marked, Risks and benefits discussed,  Surgical consent,  Pre-op evaluation,  At surgeon's request and post-op pain management  Laterality: Right and Lower  Prep: chloraprep       Needles:  Injection technique: Single-shot  Needle Type: Stimiplex     Needle Length: 10cm 10 cm Needle Gauge: 21 and 21 G    Additional Needles:  Procedures: ultrasound guided (picture in chart) Femoral nerve block Narrative:   Performed by: Personally  Anesthesiologist: Phillips Grout MD  Additional Notes: Patient tolerated the procedure well without complications

## 2013-02-28 ENCOUNTER — Encounter (HOSPITAL_COMMUNITY): Payer: Self-pay | Admitting: General Practice

## 2013-02-28 HISTORY — PX: TOTAL KNEE ARTHROPLASTY: SHX125

## 2013-02-28 LAB — GLUCOSE, CAPILLARY
Glucose-Capillary: 141 mg/dL — ABNORMAL HIGH (ref 70–99)
Glucose-Capillary: 130 mg/dL — ABNORMAL HIGH (ref 70–99)
Glucose-Capillary: 136 mg/dL — ABNORMAL HIGH (ref 70–99)
Glucose-Capillary: 157 mg/dL — ABNORMAL HIGH (ref 70–99)

## 2013-02-28 MED ORDER — PNEUMOCOCCAL VAC POLYVALENT 25 MCG/0.5ML IJ INJ
0.5000 mL | INJECTION | INTRAMUSCULAR | Status: AC
  Administered 2013-03-01: 0.5 mL via INTRAMUSCULAR
  Filled 2013-02-28: qty 0.5

## 2013-02-28 NOTE — Progress Notes (Signed)
Patient ID: Connie Ross, female   DOB: May 13, 1955, 58 y.o.   MRN: [id number] Postoperative day 1 right total knee arthroplasty. When for physical therapy progressive ambulation weightbearing as tolerated. Patient and family do not want her to go to skilled nursing. Discussed the importance of working aggressively with therapy anticipate discharge to home in one to 2 days.

## 2013-02-28 NOTE — Progress Notes (Signed)
UR completed 

## 2013-02-28 NOTE — Progress Notes (Signed)
Physical Therapy Treatment Patient Details Name: Connie Ross MRN: [id number] DOB: [birth year] Today's Date: 02/28/2013 Time: 4599-7741 PT Time Calculation (min): 27 min  PT Assessment / Plan / Recommendation  History of Present Illness RTKA   PT Comments   Pt with improved ambulation tolerance this date however required constant v/c's and significant assist to tolerate L LE advancement and to be able to clear L foot. Pt improved towards end of ambulation. Pt will need to further progress ambulation and transfer ability for safe d/c home with family.   Follow Up Recommendations  Home health PT;Supervision/Assistance - 24 hour     Does the patient have the potential to tolerate intense rehabilitation     Barriers to Discharge        Equipment Recommendations  Rolling walker with 5" wheels;3in1 (PT)    Recommendations for Other Services OT consult  Frequency 7X/week   Progress towards PT Goals Progress towards PT goals: Progressing toward goals  Plan Current plan remains appropriate    Precautions / Restrictions Precautions Precautions: Knee;Fall Precaution Comments: Pt and daughter educated in importance of knee healing fully straight Restrictions Weight Bearing Restrictions: Yes RLE Weight Bearing: Weight bearing as tolerated   Pertinent Vitals/Pain R knee pain, did not rate    Mobility  Bed Mobility General bed mobility comments: pt up in chair upon PT arrival Transfers Overall transfer level: Needs assistance Equipment used: Rolling walker (2 wheeled) Transfers: Sit to/from Stand Sit to Stand: Mod assist;+2 safety/equipment General transfer comment: Tactile cues for hand and foot placement Ambulation/Gait Ambulation/Gait assistance: +2 physical assistance;Mod assist Ambulation Distance (Feet): 30 Feet Assistive device: Rolling walker (2 wheeled) Gait Pattern/deviations: Step-to pattern;Decreased step length - left;Decreased stance time -  right;Antalgic Gait velocity: slowed General Gait Details: Frequent verbal cues to incrase step length on L, with tactile facilitation at R knee to activate quadriceps during stance phase. Demonstrated some carryover with increased WB and step symmetry towards end of distance ambulated.    Exercises Total Joint Exercises Quad Sets: AROM;Right;10 reps;Seated Knee Flexion: AAROM;Right;Seated (3 repetitions with hold)   PT Diagnosis:    PT Problem List:   PT Treatment Interventions:     PT Goals (current goals can now be found in the care plan section)    Visit Information  Last PT Received On: 02/28/13 Assistance Needed: +2 History of Present Illness: RTKA    Subjective Data  Subjective: Daughter reports that she is doing well today.   Cognition  Cognition Arousal/Alertness: Awake/alert Behavior During Therapy: WFL for tasks assessed/performed Overall Cognitive Status: Within Functional Limits for tasks assessed    Balance     End of Session PT - End of Session Equipment Utilized During Treatment: Gait belt Activity Tolerance: Patient tolerated treatment well Patient left: in chair;with call bell/phone within reach;with family/visitor present Nurse Communication: Mobility status   GP     Marcene Brawn 02/28/2013, 4:17 PM  Lewis Shock, PT, DPT Pager #: [phone] Office #: [phone]

## 2013-02-28 NOTE — Evaluation (Signed)
Occupational Therapy Evaluation Patient Details Name: Connie Ross MRN: [id number] DOB: 23-Sep-1955 Today's Date: 02/28/2013 Time: 8144-8185 OT Time Calculation (min): 27 min  OT Assessment / Plan / Recommendation History of present illness RTKA   Clinical Impression   This 58 yo female admitted and underwent above presents to acute OT with decreased AROM RLE, increased pain RLE, decreased mobility, obesity, decreased Bil UE strength all affecting pt's ability to help care for herself. She will benefit from acute OT without need for follow up.    OT Assessment  Patient needs continued OT Services    Follow Up Recommendations  No OT follow up       Equipment Recommendations  3 in 1 bedside comode       Frequency  Min 2X/week    Precautions / Restrictions Precautions Precautions: Knee;Fall Restrictions RLE Weight Bearing: Weight bearing as tolerated   Pertinent Vitals/Pain 6/10 RLE with activity; made RN aware and repositioned    ADL  Eating/Feeding: Independent Where Assessed - Eating/Feeding: Chair Grooming: Set up Where Assessed - Grooming: Supported sitting Upper Body Bathing: Set up Where Assessed - Upper Body Bathing: Supported sitting Lower Body Bathing: Maximal assistance Where Assessed - Lower Body Bathing: Supported sit to stand Upper Body Dressing: Minimal assistance Where Assessed - Upper Body Dressing: Supported sitting Lower Body Dressing: +1 Total assistance Where Assessed - Lower Body Dressing: Supported sit to Pharmacist, hospital: Moderate assistance Toilet Transfer Method: Sit to Barista:  (Bed>recliner (3 feet away)) Toileting - Clothing Manipulation and Hygiene: Maximal assistance Where Assessed - Engineer, mining and Hygiene: Sit to stand from 3-in-1 or toilet Equipment Used: Gait belt;Rolling walker Transfers/Ambulation Related to ADLs: Mod A for all with RW ADL Comments: Family will A with LB  ADLS until pt can do them for herself    OT Diagnosis: Generalized weakness;Acute pain  OT Problem List: Decreased strength;Decreased range of motion;Decreased activity tolerance;Impaired balance (sitting and/or standing);Pain;Obesity;Decreased knowledge of use of DME or AE OT Treatment Interventions: Self-care/ADL training;Patient/family education;Balance training;DME and/or AE instruction;Therapeutic activities;Therapeutic exercise   OT Goals(Current goals can be found in the care plan section) Acute Rehab OT Goals Patient Stated Goal: home not rehab OT Goal Formulation: With patient Time For Goal Achievement: 03/07/13 Potential to Achieve Goals: Good  Visit Information  Last OT Received On: 02/28/13 Assistance Needed: +1 (+2 helpful for any distance) History of Present Illness: RTKA       Prior Functioning     Home Living Family/patient expects to be discharged to:: Private residence Living Arrangements: Spouse/significant other Available Help at Discharge: Family;Available 24 hours/day Type of Home: House Home Access: Stairs to enter [employer] of Steps: 5 Entrance Stairs-Rails: Right;Left;Can reach both Home Layout: One level Home Equipment: Cane - single point Prior Function Level of Independence: Independent Communication Communication: Prefers language other than English (Spainish--grown children know English) Dominant Hand: Right         Vision/Perception Vision - History Patient Visual Report: No change from baseline   Cognition  Cognition Arousal/Alertness: Awake/alert Behavior During Therapy: WFL for tasks assessed/performed Overall Cognitive Status: Within Functional Limits for tasks assessed    Extremity/Trunk Assessment Upper Extremity Assessment Upper Extremity Assessment: Generalized weakness     Mobility Bed Mobility Overal bed mobility: Needs Assistance Bed Mobility: Supine to Sit Supine to sit: Mod assist General bed  mobility comments: Cues for technqiue, mod assist for scooting and to ease hips closer to eOB Transfers Overall transfer level: Needs assistance Equipment  used: Rolling walker (2 wheeled) Transfers: Sit to/from Stand Sit to Stand: Mod assist General transfer comment: Cues for safety and hand placement           End of Session OT - End of Session Equipment Utilized During Treatment: Gait belt;Rolling walker Activity Tolerance: Patient limited by fatigue Patient left: in chair;with call bell/phone within reach;with family/visitor present Nurse Communication: Patient requests pain meds       Evette Georges [phone] 02/28/2013, 10:46 AM

## 2013-03-01 ENCOUNTER — Encounter (HOSPITAL_COMMUNITY): Payer: Self-pay | Admitting: Orthopedic Surgery

## 2013-03-01 LAB — GLUCOSE, CAPILLARY
Glucose-Capillary: 120 mg/dL — ABNORMAL HIGH (ref 70–99)
Glucose-Capillary: 135 mg/dL — ABNORMAL HIGH (ref 70–99)
Glucose-Capillary: 142 mg/dL — ABNORMAL HIGH (ref 70–99)
Glucose-Capillary: 125 mg/dL — ABNORMAL HIGH (ref 70–99)

## 2013-03-01 MED ORDER — ASPIRIN EC 325 MG PO TBEC: 325.0000 mg | DELAYED_RELEASE_TABLET | Freq: Every day | ORAL | Status: DC

## 2013-03-01 MED ORDER — OXYCODONE-ACETAMINOPHEN 5-325 MG PO TABS: 1.0000 | ORAL_TABLET | ORAL | Status: DC | PRN

## 2013-03-01 NOTE — Progress Notes (Signed)
Patient ID: Connie Ross, female   DOB: 1955-10-12, 58 y.o.   MRN: [id number] Postoperative day 2 total knee arthroplasty. Unless patient make significant improvement with therapy she will most likely require discharge to skilled nursing. Prescription for Percocet on the chart for discharge.

## 2013-03-01 NOTE — Progress Notes (Signed)
Occupational Therapy Treatment Patient Details Name: Connie Ross MRN: [id number] DOB: 26-Jan-1955 Today's Date: 03/01/2013 Time: 0902-0920 OT Time Calculation (min): 18 min  OT Assessment / Plan / Recommendation  History of present illness RTKA   OT comments  Pt. Still requiring physical, verbal and tactile assistance for sequencing and safe completion of functional transfers.  Has family present that will also be home with her 24/7 and were active in the tx. Session and were able to verbalize and demonstrate safe tech. For assisting pt.  Report they will be assisting with all LB self care needs and decline shower stall stating sponge bathe upon d/c home.  Reviewed uses for 3-n-1 and how to adjust height.    Follow Up Recommendations  No OT follow up           Equipment Recommendations  3 in 1 bedside comode        Frequency Min 2X/week   Progress towards OT Goals Progress towards OT goals: Progressing toward goals  Plan Discharge plan remains appropriate    Precautions / Restrictions Precautions Precautions: Knee;Fall Restrictions RLE Weight Bearing: Weight bearing as tolerated   Pertinent Vitals/Pain 6/10, rn notified pt. Requesting pain meds    ADL  Toilet Transfer: Performed;Minimal assistance;Moderate assistance Toilet Transfer Method: Sit to stand;Stand pivot Toilet Transfer Equipment: Bedside commode Toileting - Clothing Manipulation and Hygiene: Performed;Minimal assistance Where Assessed - Toileting Clothing Manipulation and Hygiene: Standing Transfers/Ambulation Related to ADLs: min/mod a sit/stand with max cues for use b ues into the walker ADL Comments: family present and reports they will assist with LB ADLS until pt. can do for herself, pt. and family also declined shower stall transfer stating pt. will sponge bathe initially       OT Goals(current goals can now be found in the care plan section)    Visit Information  Last OT Received On:  03/01/13 History of Present Illness: RTKA                 Cognition  Cognition Arousal/Alertness: Awake/alert Overall Cognitive Status: Within Functional Limits for tasks assessed    Mobility  Transfers Overall transfer level: Needs assistance Equipment used: Rolling walker (2 wheeled) Transfers: Sit to/from [employer] Sit to Stand: Mod assist;Min assist Stand pivot transfers: Min assist;Mod assist General transfer comment: verbal and tactile cues for hand and foot placement during transfer and pivot              End of Session OT - End of Session Equipment Utilized During Treatment: Rolling walker Activity Tolerance: Patient tolerated treatment well Patient left: in chair;with call bell/phone within reach;with family/visitor present Nurse Communication: Other (comment) (pt. requested pain meds)       Robet Leu, COTA/L 03/01/2013, 9:30 AM

## 2013-03-01 NOTE — Progress Notes (Signed)
Physical Therapy Treatment Patient Details Name: Connie Ross MRN: [id number] DOB: 07-Feb-1955 Today's Date: 03/01/2013 Time: 0100-7121 PT Time Calculation (min): 24 min  PT Assessment / Plan / Recommendation  History of Present Illness RTKA   PT Comments   Pt able to perform gait with supervision, stairs with min A, daughter able to provide good cues for safety and technique with stair negotiation.  Follow Up Recommendations  Home health PT;Supervision/Assistance - 24 hour     Does the patient have the potential to tolerate intense rehabilitation     Barriers to Discharge        Equipment Recommendations  Rolling walker with 5" wheels;3in1 (PT)    Recommendations for Other Services    Frequency 7X/week   Progress towards PT Goals Progress towards PT goals: Progressing toward goals  Plan Current plan remains appropriate    Precautions / Restrictions Precautions Precautions: Knee;Fall Restrictions RLE Weight Bearing: Weight bearing as tolerated   Pertinent Vitals/Pain Pt c/o R knee pain with mobility, eases with rest    Mobility  Transfers Overall transfer level: Needs assistance Equipment used: Rolling walker (2 wheeled) Transfers: Sit to/from [employer] Sit to Stand: Min assist Stand pivot transfers: Min assist;Mod assist General transfer comment: cues for hand and foot placement for standing from recliner Ambulation/Gait Ambulation/Gait assistance: Supervision Ambulation Distance (Feet): 50 Feet (50', 30') Assistive device: Rolling walker (2 wheeled) General Gait Details: step to pattern, improves with continued gait Stairs: Yes Stairs assistance: Min assist Stair Management: Two rails Number of Stairs: 5 General stair comments: pt's daughter able to provide cues for correct stair negotiation, pt/daughter state they feel safe performing stairs at home    Exercises     PT Diagnosis:    PT Problem List:   PT Treatment Interventions:      PT Goals (current goals can now be found in the care plan section)    Visit Information  Last PT Received On: 03/01/13 Assistance Needed: +1 History of Present Illness: RTKA    Subjective Data      Cognition  Cognition Arousal/Alertness: Awake/alert Behavior During Therapy: WFL for tasks assessed/performed Overall Cognitive Status: Within Functional Limits for tasks assessed    Balance     End of Session PT - End of Session Equipment Utilized During Treatment: Gait belt Activity Tolerance: Patient tolerated treatment well Patient left: in chair;with call bell/phone within reach;with family/visitor present   GP     DONAWERTH,KAREN 03/01/2013, 1:04 PM

## 2013-03-01 NOTE — Care Management Note (Signed)
CARE MANAGEMENT NOTE 03/01/2013  Patient:  Connie Ross,Connie Ross   Account Number:  [id number]  Date Initiated:  03/01/2013  Documentation initiated by:  Vance Peper  Subjective/Objective Assessment:   58 yr old female s/p right total knee arthroplasty.     Action/Plan:   CM spoke with patient's daughter(she is non-english speaking). Patient has family support at discharge. CM called [facility] to begin process for home [phone], need patient's ID #. waiting for Dtg to call.   Anticipated DC Date:  03/02/2013   Anticipated DC Plan:  HOME W HOME HEALTH SERVICES         Choice offered to / List presented to:          [facility] arranged  HH-2 PT      Status of service:  In process, will continue to follow Medicare Important Message given?   (If response is "NO", the following Medicare IM given date fields will be blank) Date Medicare IM given:   Date Additional Medicare IM given:    Discharge Disposition:    Per UR Regulation:    If discussed at Long Length of Stay Meetings, dates discussed:    Comments:

## 2013-03-01 NOTE — Discharge Summary (Signed)
Physician Discharge Summary  Patient ID: Connie Ross MRN: [id number] DOB/AGE: April 08, 1955 58 y.o.  Admit date: 02/27/2013 Discharge date: 03/01/2013  Admission Diagnoses: Right knee osteoarthritis  Discharge Diagnoses: Right knee osteoarthritis Active Problems:   Total knee replacement status   Discharged Condition: stable  Hospital Course: Patient's hospital course was essentially unremarkable she progressed slowly with therapy and was discharged.  Consults: None  Significant Diagnostic Studies: labs: Routine labs  Treatments: surgery: See operative note  Discharge Exam: Blood pressure 128/70, pulse 102, temperature 98.6 F (37 C), temperature source Oral, resp. rate 16, SpO2 99.00%. Incision/Wound: dressing clean dry and intact  Disposition: Final discharge disposition not confirmed     Medication List    ASK your doctor about these medications       acetaminophen 500 MG tablet  Commonly known as:  TYLENOL  Take 500 mg by mouth every 6 (six) hours as needed for mild pain.     aspirin 81 MG tablet  Take 81 mg by mouth daily.     esomeprazole 20 MG capsule  Commonly known as:  NEXIUM  Take 20 mg by mouth daily at 12 noon.     HYDROcodone-ibuprofen 7.5-200 MG per tablet  Commonly known as:  VICOPROFEN  Take 1 tablet by mouth every 6 (six) hours as needed for moderate pain.     hydroxychloroquine 200 MG tablet  Commonly known as:  PLAQUENIL  Take 200 mg by mouth 2 (two) times daily.     losartan-hydrochlorothiazide 50-12.5 MG per tablet  Commonly known as:  HYZAAR  Take 1 tablet by mouth daily.     methotrexate 2.5 MG tablet  Commonly known as:  RHEUMATREX  Take 2.5 mg by mouth once a week. Caution:Chemotherapy. Protect from light.. Take 6 tabs weekly     Saxagliptin-Metformin 2.05-998 MG Tb24  Take 1 tablet by mouth daily.     Vitamin D3 50000 UNITS Caps  Take 1 tablet by mouth daily.           Follow-up Information   Follow up with  Jordanna Hendrie V, MD In 1 week.   Specialty:  Orthopedic Surgery   Contact information:   [address] [place] [address] [phone]       Signed: Nadara Mustard 03/01/2013, 6:39 AM

## 2013-03-01 NOTE — Care Management Note (Signed)
CARE MANAGEMENT NOTE 03/01/2013  Patient:  REYES-CASTRO,Lakin   Account Number:  [id number]  Date Initiated:  03/01/2013  Documentation initiated by:  Vance Peper  Subjective/Objective Assessment:   58 yr old female s/p right total knee arthroplasty.     Action/Plan:   CM spoke with patient's daughter(she is non-english speaking). Patient has family support at discharge. CM called [facility] to begin process for home health.[phone], Preoperatively setup with [facility], no changes.   Anticipated DC Date:  03/02/2013   Anticipated DC Plan:  HOME W HOME HEALTH SERVICES      DC Planning Services  CM consult      [facility] Choice  HOME HEALTH  DURABLE MEDICAL EQUIPMENT   Choice offered to / List presented to:  C-4 Adult Children   DME arranged  3-N-1  WALKER - ROLLING      DME agency  [employer]     HH arranged  HH-2 PT  HH-1 RN      [facility] agency  [facility]   Status of service:  Completed, signed off Medicare Important Message given?   (If response is "NO", the following Medicare IM given date fields will be blank) Date Medicare IM given:   Date Additional Medicare IM given:    Discharge Disposition:  HOME W HOME HEALTH SERVICES

## 2013-03-01 NOTE — Progress Notes (Signed)
Physical Therapy Treatment Patient Details Name: Connie Ross MRN: [id number] DOB: 1955-12-23 Today's Date: 03/01/2013 Time: 1884-1660 PT Time Calculation (min): 19 min  PT Assessment / Plan / Recommendation  History of Present Illness RTKA   PT Comments   Pt tolerated therex well, states she is ready to go home!  Follow Up Recommendations  Home health PT;Supervision/Assistance - 24 hour     Does the patient have the potential to tolerate intense rehabilitation     Barriers to Discharge        Equipment Recommendations  Rolling walker with 5" wheels;3in1 (PT)    Recommendations for Other Services    Frequency 7X/week   Progress towards PT Goals Progress towards PT goals: Progressing toward goals  Plan Current plan remains appropriate    Precautions / Restrictions Precautions Precautions: Knee;Fall Restrictions RLE Weight Bearing: Weight bearing as tolerated   Pertinent Vitals/Pain No c/o pain    Mobility  Bed Mobility Bed Mobility: Supine to Sit;Sit to Supine Supine to sit: Supervision Sit to supine: Min assist General bed mobility comments: min A for R LE Transfers Equipment used: Rolling walker (2 wheeled) Sit to Stand: Min assist General transfer comment: cues for hand and foot placement for standing from recliner Ambulation/Gait Ambulation/Gait assistance: Supervision Ambulation Distance (Feet): 50 Feet (50', 30') Assistive device: Rolling walker (2 wheeled) General Gait Details: step to pattern, improves with continued gait Stairs: Yes Stairs assistance: Min assist Stair Management: Two rails Number of Stairs: 5 General stair comments: pt's daughter able to provide cues for correct stair negotiation, pt/daughter state they feel safe performing stairs at home    Exercises Total Joint Exercises Ankle Circles/Pumps: AROM;Both;20 reps Short Arc Quad: Right;15 reps;AAROM Heel Slides: AAROM;Right;15 reps Hip ABduction/ADduction: AROM;Right;15  reps Straight Leg Raises: AAROM;Right;10 reps Long Arc Quad: AROM;Right;15 reps   PT Diagnosis:    PT Problem List:   PT Treatment Interventions:     PT Goals (current goals can now be found in the care plan section)    Visit Information  Last PT Received On: 03/01/13 Assistance Needed: +1 History of Present Illness: RTKA    Subjective Data      Cognition  Cognition Arousal/Alertness: Awake/alert Behavior During Therapy: WFL for tasks assessed/performed Overall Cognitive Status: Within Functional Limits for tasks assessed    Balance     End of Session PT - End of Session Equipment Utilized During Treatment: Gait belt Activity Tolerance: Patient tolerated treatment well Patient left: in bed;with call bell/phone within reach;with family/visitor present   GP     Connie Ross 03/01/2013, 1:34 PM

## 2013-03-02 LAB — GLUCOSE, CAPILLARY
Glucose-Capillary: 112 mg/dL — ABNORMAL HIGH (ref 70–99)
Glucose-Capillary: 114 mg/dL — ABNORMAL HIGH (ref 70–99)

## 2013-03-02 NOTE — Progress Notes (Signed)
Subjective: 3 Days Post-Op Procedure(s) (LRB): TOTAL KNEE ARTHROPLASTY- knee (Right) Patient reports pain as mild and moderate.    Objective: Vital signs in last 24 hours: Temp:  [99 F (37.2 C)-101.5 F (38.6 C)] 99 F (37.2 C) (02/28 0609) Pulse Rate:  [90-101] 99 (02/28 1035) Resp:  [16-18] 16 (02/28 0833) BP: (104-115)/(44-56) 109/44 mmHg (02/28 1035) SpO2:  [94 %-98 %] 96 % (02/28 0833)  Intake/Output from previous day: 02/27 0701 - 02/28 0700 In: 790 [P.O.:790] Out: -  Intake/Output this shift: Total I/O In: 120 [P.O.:120] Out: -   No results found for this basename: HGB,  in the last 72 hours No results found for this basename: WBC, RBC, HCT, PLT,  in the last 72 hours No results found for this basename: NA, K, CL, CO2, BUN, CREATININE, GLUCOSE, CALCIUM,  in the last 72 hours No results found for this basename: LABPT, INR,  in the last 72 hours  Sensation intact distally Dorsiflexion/Plantar flexion intact No SOB, Chest pain Independent with therapy  Assessment/Plan: 3 Days Post-Op Procedure(s) (LRB): TOTAL KNEE ARTHROPLASTY- knee (Right) Much improved Discharge home with home health  Connie Ross W 03/02/2013, 10:59 AM

## 2013-03-02 NOTE — Progress Notes (Signed)
Occupational Therapy Treatment Patient Details Name: Connie Ross MRN: [id number] DOB: 18-Aug-1955 Today's Date: 03/02/2013 Time: 1037-1100 OT Time Calculation (min): 23 min  OT Assessment / Plan / Recommendation  History of present illness 58 yo female s/p RT TKA    OT comments  All education is complete and patient indicates understanding. Pt is adequate level for d/c home at this time with all DME delivered to room. RN notified and family without further questions.  Follow Up Recommendations  No OT follow up    Barriers to Discharge       Equipment Recommendations  3 in 1 bedside comode (present in room)    Recommendations for Other Services    Frequency Min 2X/week   Progress towards OT Goals Progress towards OT goals: Goals met/education completed, patient discharged from [facility] Discharge plan remains appropriate    Precautions / Restrictions Precautions Precautions: Knee;Fall Restrictions Weight Bearing Restrictions: Yes RLE Weight Bearing: Weight bearing as tolerated   Pertinent Vitals/Pain Minimal pain Educated on elevation and ice for d/c home pain management    ADL  Upper Body Dressing: Supervision/safety Where Assessed - Upper Body Dressing: Unsupported sitting Lower Body Dressing: Minimal assistance Where Assessed - Lower Body Dressing: Unsupported sit to stand Toilet Transfer: Supervision/safety Toilet Transfer Method: Sit to Loss adjuster, chartered: Bedside commode Equipment Used: Rolling walker ADL Comments: Family present to help with translating education. 3n1 and RW delivered to room so therapist adjusting DME to patient height. Family nor patient educated on how to use DME. Therapist educating on how to connect 3n1, how to adjust height, and how to disconnect for transport in car. pt completed dressing for home and toilet transfer. Pt educated on RT LE extended for sit<>stand transfers and hand placement. Pt pulling up on RW on  arrival.    OT Diagnosis:    OT Problem List:   OT Treatment Interventions:     OT Goals(current goals can now be found in the care plan section) Acute Rehab OT Goals OT Goal Formulation: With patient Time For Goal Achievement: 03/07/13 Potential to Achieve Goals: Good ADL Goals Pt Will Transfer to Toilet: with min guard assist;ambulating;bedside commode Pt Will Perform Toileting - Clothing Manipulation and hygiene: with min guard assist;sit to/from stand Pt Will Perform Tub/Shower Transfer: with min assist;ambulating;3 in 1;Shower transfer Pt/caregiver will Perform Home Exercise Program: Increased strength;Both right and left upper extremity;With theraband;With Supervision;With written HEP provided  Visit Information  Last OT Received On: 03/02/13 History of Present Illness: 58 yo female s/p RT TKA     Subjective Data      Prior Functioning       Cognition  Cognition Arousal/Alertness: Awake/alert Behavior During Therapy: WFL for tasks assessed/performed Overall Cognitive Status: Within Functional Limits for tasks assessed    Mobility  Bed Mobility Overal bed mobility: Needs Assistance Bed Mobility: Supine to Sit;Sit to Supine Supine to sit: Min assist (hob flat) Sit to supine: Mod assist (HOB flat) General bed mobility comments: pt needed cues for sequencing correctly and pt posteriorly descending to bed instead of lateral lean. Pt needed (A) with BIL LE to return to supine. Family present and assisting patient with task for home education. Pt reports "that is tricky" in spanish Transfers Overall transfer level: Needs assistance Equipment used: Rolling walker (2 wheeled) Transfers: Sit to/from Stand Sit to Stand: Supervision Stand pivot transfers: Supervision    Exercises      Balance    End of Session OT - End  of Session Equipment Utilized During Treatment: Rolling walker Activity Tolerance: Patient tolerated treatment well Patient left: in bed;with call  bell/phone within reach;with family/visitor present Nurse Communication: Mobility status;Precautions  GO     Connie Ross 03/02/2013, 11:18 AM Pager: [phone]

## 2013-03-02 NOTE — Progress Notes (Signed)
Physical Therapy Treatment Patient Details Name: Connie Ross MRN: [id number] DOB: [birth year] Today's Date: 03/02/2013 Time: 1000-1023 PT Time Calculation (min): 23 min  PT Assessment / Plan / Recommendation  History of Present Illness 58 yo female s/p RT TKA    PT Comments   Pt making steady progress with mobility. Daughter educated on how to assist pt with exercises for ROM and strengthening at home, handout issued.   Follow Up Recommendations  Home health PT;Supervision/Assistance - 24 hour     Equipment Recommendations  Rolling walker with 5" wheels;3in1 (PT)    Recommendations for Other Services OT consult  Frequency 7X/week   Progress towards PT Goals Progress towards PT goals: Progressing toward goals  Plan Current plan remains appropriate    Precautions / Restrictions Precautions Precautions: Knee;Fall Precaution Comments: Pt and daughter educated on no pillows under knee  Restrictions RLE Weight Bearing: Weight bearing as tolerated    Mobility  Bed Mobility Overal bed mobility: Needs Assistance Bed Mobility: Supine to Sit;Sit to Supine Supine to sit: Min assist;HOB elevated Sit to supine: Min assist General bed mobility comments: pt sitting up to edge of bed before head of bed could be lowered, assist only to advance right leg to/off edge of bed. supervision with cues to scoot to edge of bed. min assist with bed flat and no rail to lie down to supine: pt educated to use left leg to lift right leg onto bed surface.                        Transfers Overall transfer level: Needs assistance Equipment used: Rolling walker (2 wheeled) Transfers: Sit to/from Stand Sit to Stand: Supervision Stand pivot transfers: Supervision General transfer comment: cues for hand and right leg placement with transfers Ambulation/Gait Ambulation/Gait assistance: Supervision Ambulation Distance (Feet): 70 Feet Assistive device: Rolling walker (2 wheeled) Gait  Pattern/deviations: Step-to pattern;Antalgic;Decreased step length - left;Decreased stance time - right Gait velocity: decreased Gait velocity interpretation: Below normal speed for age/gender General stair comments: pt and daughter report feeling comfortable with stairs after yesterday's sessions.    Exercises Total Joint Exercises Ankle Circles/Pumps: AROM;Strengthening;Both;10 reps;Supine (handout given and daughter educated, along with practice on all exercises listed) Quad Sets: AROM;Strengthening;Both;10 reps;Supine Short Arc Quad: AAROM;Strengthening;Right;10 reps;Supine Heel Slides: AAROM;Strengthening;Right;10 reps;Supine Hip ABduction/ADduction: AAROM;Strengthening;Right;10 reps;Supine Straight Leg Raises: AAROM;Strengthening;Right;10 reps;Supine     PT Goals (current goals can now be found in the care plan section) Acute Rehab PT Goals Patient Stated Goal: home not rehab PT Goal Formulation: With patient Time For Goal Achievement: 03/06/13 Potential to Achieve Goals: Good  Visit Information  Last PT Received On: 03/02/13 Assistance Needed: +1 History of Present Illness: 58 yo female s/p RT TKA     Subjective Data  Patient Stated Goal: home not rehab   Cognition  Cognition Arousal/Alertness: Awake/alert Behavior During Therapy: WFL for tasks assessed/performed Overall Cognitive Status: Within Functional Limits for tasks assessed    End of Session PT - End of Session Equipment Utilized During Treatment: Gait belt Activity Tolerance: Patient tolerated treatment well Patient left: in bed;with call bell/phone within reach;with family/visitor present Nurse Communication: Mobility status;Patient requests pain meds   GP     Sallyanne Kuster 03/02/2013, 1:43 PM  Sallyanne Kuster, PTA Office- [phone]

## 2013-03-02 NOTE — Progress Notes (Signed)
   CARE MANAGEMENT NOTE 03/02/2013  Patient:  Connie Ross,Connie Ross   Account Number:  [id number]  Date Initiated:  03/01/2013  Documentation initiated by:  Vance Peper  Subjective/Objective Assessment:   58 yr old female s/p right total knee arthroplasty.     Action/Plan:   CM spoke with patient's daughter(she is non-english speaking). Patient has family support at discharge. CM called [facility] to begin process for home [phone], Preoperatively setup with [facility], no changes.   Anticipated DC Date:  03/02/2013   Anticipated DC Plan:  HOME W HOME HEALTH SERVICES      DC Planning Services  CM consult      [facility] Choice  HOME HEALTH  DURABLE MEDICAL EQUIPMENT   Choice offered to / List presented to:  C-4 Adult Children   DME arranged  3-N-1  WALKER [facility] ROLLING      DME agency  [employer]     HH arranged  HH-2 PT  HH-1 RN      [facility] agency  [facility]   Status of service:  Completed, signed off Medicare Important Message given?   (If response is "NO", the following Medicare IM given date fields will be blank) Date Medicare IM given:   Date Additional Medicare IM given:    Discharge Disposition:  HOME W HOME HEALTH SERVICES  Per UR Regulation:    If discussed at Long Length of Stay Meetings, dates discussed:    Comments:  03/02/13 10:23 CM spoke with [facility] for HHPT/RN. Genevieve Norlander unable to render [facility] services.  [facility] will be the contracted [facility] agency [facility] will arrange HHRN/PT.  CM faxed facesheet with contats of daughters, [name] or Maryclare Bean; orders with specified ID number: ID# [id number] (from [facility]); PT recc.; OP note, H&P; and DC summary to [phone].  DME in room prior to delivery.  CM spoke with [place] to verify address and contact number.  No other CM needs were communicated.  Freddy Jaksch, BSN, CM [phone].

## 2013-03-02 NOTE — Progress Notes (Deleted)
   CARE MANAGEMENT NOTE 03/02/2013  Patient:  Connie Ross,Connie Ross   Account Number:  [id number]  Date Initiated:  03/01/2013  Documentation initiated by:  Vance Peper  Subjective/Objective Assessment:   59 yr old female s/p right total knee arthroplasty.     Action/Plan:   CM spoke with patient's daughter(she is non-english speaking). Patient has family support at discharge. CM called [facility] to begin process for home health.[phone], Preoperatively setup with [facility], no changes.   Anticipated DC Date:  03/02/2013   Anticipated DC Plan:  HOME W HOME HEALTH SERVICES      DC Planning Services  CM consult      [facility] Choice  HOME HEALTH  DURABLE MEDICAL EQUIPMENT   Choice offered to / List presented to:  C-4 Adult Children   DME arranged  3-N-1  [facility]      DME agency  [employer]     HH arranged  HH-2 PT  HH-1 RN      [facility] agency  [facility]   Status of service:  Completed, signed off Medicare Important Message given?   (If response is "NO", the following Medicare IM given date fields will be blank) Date Medicare IM given:   Date Additional Medicare IM given:    Discharge Disposition:  HOME W HOME HEALTH SERVICES  Per UR Regulation:    If discussed at Long Length of Stay Meetings, dates discussed:    Comments:  03/02/13 08:43 CM notified [facility] of pt discharge.  No other CM needs were communicated.  Freddy Jaksch, BSN, CM [phone].

## 2013-03-15 ENCOUNTER — Ambulatory Visit (HOSPITAL_COMMUNITY)
Admission: RE | Admit: 2013-03-15 | Discharge: 2013-03-15 | Disposition: A | Discharge status: Home / Self Care | Payer: 59 | Source: Ambulatory Visit | Attending: Orthopedic Surgery | Admitting: Orthopedic Surgery

## 2013-03-15 DIAGNOSIS — I10 Essential (primary) hypertension: Secondary | ICD-10-CM | POA: Insufficient documentation

## 2013-03-15 DIAGNOSIS — IMO0001 Reserved for inherently not codable concepts without codable children: Secondary | ICD-10-CM | POA: Insufficient documentation

## 2013-03-15 DIAGNOSIS — M25561 Pain in right knee: Secondary | ICD-10-CM

## 2013-03-15 DIAGNOSIS — M25569 Pain in unspecified knee: Secondary | ICD-10-CM | POA: Insufficient documentation

## 2013-03-15 DIAGNOSIS — R269 Unspecified abnormalities of gait and mobility: Secondary | ICD-10-CM

## 2013-03-15 DIAGNOSIS — Z79899 Other long term (current) drug therapy: Secondary | ICD-10-CM | POA: Insufficient documentation

## 2013-03-15 DIAGNOSIS — M25669 Stiffness of unspecified knee, not elsewhere classified: Secondary | ICD-10-CM | POA: Insufficient documentation

## 2013-03-15 DIAGNOSIS — E119 Type 2 diabetes mellitus without complications: Secondary | ICD-10-CM | POA: Insufficient documentation

## 2013-03-15 DIAGNOSIS — K219 Gastro-esophageal reflux disease without esophagitis: Secondary | ICD-10-CM | POA: Insufficient documentation

## 2013-03-15 NOTE — Evaluation (Signed)
Physical Therapy Evaluation  Patient Details  Name: Connie Ross MRN: [id number] Date of Birth: 1955/12/22  Today's Date: 03/15/2013 Time: 1300-1345 PT Time Calculation (min): 45 min   Charges : 1 evaluation            Visit#: 1 of 12  Re-eval: 04/14/13 Assessment Diagnosis: R TKR  Surgical Date: 02/27/13 Next MD Visit: Dr. Lajoyce Corners MD  04/08/13 Prior Therapy: no   Authorization: [employer] - needs med necessity letter     Authorization Time Period:    Authorization Visit#: 1 of 12   Past Medical History:  Past Medical History  Diagnosis Date  . Hypertension   . GERD (gastroesophageal reflux disease)   . High cholesterol   . Type II diabetes mellitus   . Rheumatoid arthritis    Past Surgical History:  Past Surgical History  Procedure Laterality Date  . Total knee arthroplasty Right 02/28/2013  . Total knee arthroplasty Right 02/27/2013    Procedure: TOTAL KNEE ARTHROPLASTY- knee;  Surgeon: Nadara Mustard, MD;  Location: [facility] OR;  Service: Orthopedics;  Laterality: Right;  Right Total Knee Arthroplasty    Subjective Symptoms/Limitations Symptoms: s/p R TKE 02/27/2013, c/o post op rihgt knee pain, swelling, limited household activity, knee stiffness , inability to work Pertinent History: cane use prior to surgery, failed knee conservative therapy Limitations: Standing;Walking;House hold activities How long can you walk comfortably?: 3-5 min with rolling walker  Patient Stated Goals: decrease pain, improve strength , return to work, return to household activities  Pain Assessment Pain Score: 5  Pain Location: Knee Pain Orientation: Right Pain Type: Surgical pain;Acute pain    Balance Screening Balance Screen Has the patient fallen in the past 6 months: No Has the patient had a decrease in activity level because of a fear of falling? : Yes Is the patient reluctant to leave their home because of a fear of falling? : No  Prior Functioning  Home Living Additional  Comments: 5 stairs to enter home  Prior Function Level of Independence: Independent with basic ADLs;Independent with gait  Able to Take Stairs?: Yes Vocation: Full time employment Vocation Requirements: standing/ wallking   Cognition/Observation Cognition Overall Cognitive Status: Within Functional Limits for tasks assessed Observation/Other Assessments Observations: presens with rolling walker, daughter present and interprets  Other Assessments: steri strips intact   Sensation/Coordination/Flexibility/Functional Tests Functional Tests Functional Tests: foto 55/45  Assessment RLE AROM (degrees) Right Knee Extension: 8 Right Knee Flexion: 105 RLE PROM (degrees) Right Knee Extension: 5 Right Knee Flexion: 110 RLE Strength Right Hip ABduction: 3/5 Right Knee Flexion: 3+/5 Right Knee Extension: 3+/5  Exercise/Treatments Mobility/Balance  Bed Mobility Bed Mobility: Sit to Supine Sit to Supine: 4: Min assist Sit to Supine - Details (indicate cue type and reason): assist with R LE  Transfers Transfers: Sit to Stand Sit to Stand: 7: Independent;With armrests Ambulation/Gait Ambulation/Gait: Yes Gait Pattern: Step-through pattern;Decreased stance time - right;Decreased step length - left   Seated Long Arc Quad: Right;10 reps Heel Slides: Right;10 reps Educated on proper placement of pillow along entire leg and not under knee  Educated on supine knee extension stretch with heel propped 2 min 2 x daily  Supine Quad Sets: Right;5 reps Short Arc Quad Sets: Right;10 reps Heel Slides: 10 reps Straight Leg Raises: Right;5 sets      Physical Therapy Assessment and Plan PT Assessment and Plan Clinical Impression Statement: 58 year old female s/p right TKA 02/27/2013. She did not receive home care but daughter has been helping  with exercises. She requires skilled therapy at this time to reach goals and return to gait no device and independence with basic housoehold activities.  She is currently using walker and getting assist with cooking,cleaning and her prior level of fuction which included full time work, independent  ADLS.  Pt will benefit from skilled therapeutic intervention in order to improve on the following deficits: Abnormal gait;Decreased activity tolerance;Difficulty walking;Decreased strength;Decreased range of motion;Impaired UE functional use;Pain Rehab Potential: Good PT Frequency: Min 3X/week PT Duration: 4 weeks PT Treatment/Interventions: Gait training;Stair training;Functional mobility training;Therapeutic activities;Therapeutic exercise;Manual techniques;Patient/family education;Neuromuscular re-education;Balance training;Modalities PT Plan: knee program, hep progression, post op knee     Goals PT Short Term Goals Time to Complete Short Term Goals: 2 weeks PT Short Term Goal 1: imrpove right knee AROM 0- 115 for sitting comfort, gait, stair use  PT Short Term Goal 2: patient improve right quad strength to 3+/5 for stability  PT Short Term Goal 3: indepenent bed mobility sit to supine for independence at home  PT Long Term Goals Time to Complete Long Term Goals: 4 weeks PT Long Term Goal 1: patient able to walk with straight cane 10 - 15 min for household and start of community ambulation  PT Long Term Goal 2: Patient able to stand in kitchen 10 + minutes for meal prep  Long Term Goal 3: patient able to perform light household acitivities of cooking and cleaning  Long Term Goal 4: patient able to ascend 5 stairs comfortably with min use of handrial   Problem List Patient Active Problem List   Diagnosis Date Noted  . Abnormality of gait 03/15/2013  . Right knee pain 03/15/2013  . Total knee replacement status 02/27/2013    PT Plan of Care PT Patient Instructions: written HEP provide, daugher interprets  Consulted and Agree with Plan of Care: Family member/caregiver;Patient  GP Functional Assessment Tool Used: FOTO mobility 55, 45%    Rusell Meneely 03/15/2013, 2:59 PM  Physician Documentation Your signature is required to indicate approval of the treatment plan as stated above.  Please sign and either send electronically or make a copy of this report for your files and return this physician signed original.   Please mark one 1.__approve of plan  2. ___approve of plan with the following conditions.   ______________________________                                                          _____________________ Physician Signature                                                                                                             Date

## 2013-03-18 ENCOUNTER — Ambulatory Visit (HOSPITAL_COMMUNITY)
Admission: RE | Admit: 2013-03-18 | Discharge: 2013-03-18 | Disposition: A | Discharge status: Home / Self Care | Payer: 59 | Source: Ambulatory Visit | Attending: Family Medicine | Admitting: Family Medicine

## 2013-03-18 NOTE — Progress Notes (Signed)
Physical Therapy Treatment Patient Details  Name: Connie Ross MRN: [id number] Date of Birth: [birth year]  Today's Date: 03/18/2013 Time: 9983-3825 PT Time Calculation (min): 44 min Manual  TE  29 min Visit#: 2 of 12  Re-eval: 04/14/13 Assessment Diagnosis: R TKR  Surgical Date: 02/27/13 Next MD Visit: Dr. Lajoyce Corners MD  04/08/13 Prior Therapy: no   Authorization: [employer] -   Authorization Visit#: 2 of 12   Subjective: Symptoms/Limitations Symptoms: Continued R knee pain following TKA  Exercise/Treatments Mobility/Balance  Bed Mobility Sit to Supine: 6: Modified independent (Device/Increase time)   Standing Heel Raises: 10 reps Forward Step Up: Step Height: 4";10 reps Other Standing Knee Exercises: Marching 10x each at parallel bars for balance Seated Long Arc Quad: Right;10 reps Heel Slides: Right;10 reps Supine Quad Sets: Right;5 reps Short Arc Quad Sets: Right;10 reps; required cuing for muscle activiation Heel Slides: 10 reps Straight Leg Raises: Right;5 sets  Manual Therapy Manual Therapy: Myofascial release Joint Mobilization: Manual stretching of hamstring and quad, Mobilization of knee with anterior and posterior mobilizations to increase knee flexion/extension ROM.   Physical Therapy Assessment and Plan PT Assessment and Plan Clinical Impression Statement: Patient responded well to exercises today noting no increase in pain, while displaying increased AROM. Following anterior and postior gibia grade 3 mobilzations patient displayed increase extesnion ROM and ith decreased pain noted at end range.  Patient's daughter present for therapy and educated on performance of knee joint mobilzation techniques to increase assist in increasing patient's knee extension ROM.  Pt will benefit from skilled therapeutic intervention in order to improve on the following deficits: Abnormal gait;Decreased activity tolerance;Difficulty walking;Decreased strength;Decreased range  of motion;Impaired UE functional use;Pain Rehab Potential: Good PT Frequency: Min 3X/week PT Duration: 4 weeks PT Plan: knee program, hep progression, post op knee     Goals Home Exercise Program Pt/caregiver will Perform Home Exercise Program: For increased ROM;For increased strengthening;With Supervision, verbal cues required/provided PT Short Term Goals PT Short Term Goal 1: imrpove right knee AROM 0- 115 for sitting comfort, gait, stair use  PT Short Term Goal 1 - Progress: Progressing toward goal PT Short Term Goal 2: patient improve right quad strength to 3+/5 for stability  PT Short Term Goal 2 - Progress: Progressing toward goal PT Short Term Goal 3: indepenent bed mobility sit to supine for independence at home  PT Short Term Goal 3 - Progress: Progressing toward goal PT Long Term Goals PT Long Term Goal 1: patient able to walk with straight cane 10 - 15 min for household and start of community ambulation  PT Long Term Goal 1 - Progress: Progressing toward goal PT Long Term Goal 2: Patient able to stand in kitchen 10 + minutes for meal prep  PT Long Term Goal 2 - Progress: Progressing toward goal Long Term Goal 3: patient able to perform light household acitivities of cooking and cleaning  Long Term Goal 3 Progress: Progressing toward goal Long Term Goal 4: patient able to ascend 5 stairs comfortably with min use of handrial  Long Term Goal 4 Progress: Progressing toward goal  Problem List Patient Active Problem List   Diagnosis Date Noted  . Abnormality of gait 03/15/2013  . Right knee pain 03/15/2013  . Total knee replacement status 02/27/2013    PT - End of Session Activity Tolerance: Patient tolerated treatment well General Behavior During Therapy: WFL for tasks assessed/performed PT Plan of Care PT Home Exercise Plan: added knee extension mobilzation to HEP with education of  daughter for correct performance.  PT Patient Instructions: written HEP provide, daugher  interprets  Consulted and Agree with Plan of Care: Family member/caregiver;Patient  GP   Judy Pimple PT  Juhi Lagrange R 03/18/2013, 1:55 PM

## 2013-03-20 ENCOUNTER — Ambulatory Visit (HOSPITAL_COMMUNITY)
Admission: RE | Admit: 2013-03-20 | Discharge: 2013-03-20 | Disposition: A | Discharge status: Home / Self Care | Payer: 59 | Source: Ambulatory Visit | Attending: Family Medicine | Admitting: Family Medicine

## 2013-03-20 DIAGNOSIS — R269 Unspecified abnormalities of gait and mobility: Secondary | ICD-10-CM

## 2013-03-20 NOTE — Progress Notes (Signed)
Physical Therapy Treatment Patient Details  Name: Connie Ross MRN: [id number] Date of Birth: [birth year]  Today's Date: 03/20/2013 Time: 1306-1350 PT Time Calculation (min): 44 min Manual therapy: 1320-1330, 8270-7867 (67mn), Therapeutic Exercise 1306-1320, 1330-1340, 1345-1350 (242m)  Visit#: 3 of 12  Re-eval: 04/14/13 Assessment Diagnosis: R TKR  Surgical Date: 02/27/13 Next MD Visit: Dr. DuSharol GivenD  04/08/13 Prior Therapy: no   Authorization: [employer] -   Authorization Time Period:    Authorization Visit#: 3 of 12   Subjective: Symptoms/Limitations Symptoms: Continued R knee pain following TKA though decreased since last session Patient states having difficulty sleeping due to calf cramping. Patient Stated Goals: decrease pain, improve strength , return to work, return to household activities  Pain Assessment Currently in Pain?: Yes Pain Score: 4  Pain Location: Knee Pain Orientation: Right Pain Type: Acute pain;Surgical pain Pain Frequency: Constant  Precautions/Restrictions     Exercise/Treatments Mobility/Balance  Bed Mobility Sit to Supine: 6: Modified independent (Device/Increase time)    Stretches Hip Flexor Stretch: 4 reps;30 seconds Gastroc Stretch: 4 reps;30 seconds Aerobic Stationary Bike: 5 minutes   Standing Forward Lunges: Both Other Standing Knee Exercises: lunge to 8" with 3 way reach, Functional Manual Reaction technique used to facilitate knee flexion during R LE lunge, Functional manual reaction used to facilitate hip/knee extension prior to heel off Supine Quad Sets: 10 reps;AROM (with grade 3 PA to facilitae extension) Heel Slides: 10 reps;AROM  Manual Therapy Manual Therapy: Joint mobilization Joint Mobilization: Assisted PROM stretching of hamstring and grastroc with 78m27mof Tibia PA glides at knee. Knee flexion AP mobs at tibia 42m93myofascial Release: Calf, ITB, Hamstring, Quad 78min34mhysical Therapy Assessment and Plan PT  Assessment and Plan Clinical Impression Statement: Patient continues to ambulates with decreasesed stride length, limited knee flexion and extension ROMupon arriving to therapy. Following manual theral and stretching exercise patient displays knee flexion ROM of 111 and Extension of 5 degrees from 0. This however did not resut in improved gait. Patient then performed lunging with 3 way UE reaching with lfunctional manual reaction tehniques used to facilitate increased knee flexion at foot flat and increase hip/knee extension just prior to heel off; this resulted in patient ambuloating with increased stride length, increased hip extension and a more equal stride bilaterally.  Contiue with current POC specifically with lunging activities. Initiate squatting next session to increase glut strength.  Pt will benefit from skilled therapeutic intervention in order to improve on the following deficits: Abnormal gait;Decreased activity tolerance;Difficulty walking;Decreased strength;Decreased range of motion;Impaired UE functional use;Pain Rehab Potential: Good PT Frequency: Min 3X/week PT Duration: 4 weeks PT Treatment/Interventions: Gait training;Stair training;Functional mobility training;Therapeutic activities;Therapeutic exercise;Manual techniques;Patient/family education;Neuromuscular re-education;Balance training;Modalities PT Plan:  Contiue with current POC specifically with lunging activities and functional manual reactions to improve gait. Initiate squatting next session to improve glut strength.     Goals Home Exercise Program Pt/caregiver will Perform Home Exercise Program: For increased ROM;For increased strengthening;With Supervision, verbal cues required/provided PT Goal: Perform Home Exercise Program - Progress: Progressing toward goal PT Short Term Goals Time to Complete Short Term Goals: 2 weeks PT Short Term Goal 1: imrpove right knee AROM 0- 115 for sitting comfort, gait, stair use  PT  Short Term Goal 1 - Progress: Progressing toward goal PT Short Term Goal 2: patient improve right quad strength to 3+/5 for stability  PT Short Term Goal 2 - Progress: Progressing toward goal PT Short Term Goal 3: indepenent bed mobility sit to supine for independence  at home  PT Short Term Goal 3 - Progress: Met PT Long Term Goals Time to Complete Long Term Goals: 4 weeks PT Long Term Goal 1: patient able to walk with straight cane 10 - 15 min for household and start of community ambulation  PT Long Term Goal 1 - Progress: Progressing toward goal PT Long Term Goal 2: Patient able to stand in kitchen 10 + minutes for meal prep  PT Long Term Goal 2 - Progress: Progressing toward goal Long Term Goal 3: patient able to perform light household acitivities of cooking and cleaning  Long Term Goal 3 Progress: Progressing toward goal Long Term Goal 4: patient able to ascend 5 stairs comfortably with min use of handrial  Long Term Goal 4 Progress: Progressing toward goal  Problem List Patient Active Problem List   Diagnosis Date Noted  . Abnormality of gait 03/15/2013  . Right knee pain 03/15/2013  . Total knee replacement status 02/27/2013    PT - End of Session Activity Tolerance: Patient tolerated treatment well General Behavior During Therapy: WFL for tasks assessed/performed PT Plan of Care PT Home Exercise Plan: added knee extension mobilzation to HEP with education of daughter for correct performance.  PT Patient Instructions: written HEP provide, daugher interprets  Consulted and Agree with Plan of Care: Family member/caregiver;Patient  GP Functional Assessment Tool Used: FOTO mobility 55, 45%   Kiwana Deblasi R PT, DPT 03/20/2013, 2:09 PM

## 2013-03-22 ENCOUNTER — Ambulatory Visit (HOSPITAL_COMMUNITY)
Admission: RE | Admit: 2013-03-22 | Discharge: 2013-03-22 | Disposition: A | Discharge status: Home / Self Care | Payer: 59 | Source: Ambulatory Visit | Attending: Family Medicine | Admitting: Family Medicine

## 2013-03-22 DIAGNOSIS — R269 Unspecified abnormalities of gait and mobility: Secondary | ICD-10-CM

## 2013-03-22 NOTE — Progress Notes (Signed)
Physical Therapy Treatment Patient Details  Name: Connie Ross MRN: [id number] Date of Birth: 06-01-1955  Today's Date: 03/22/2013 Time: 1300-1358 PT Time Calculation (min): 58 min Charge: TE 1300-1345, Ice 4098-1191  Visit#: 4 of 12  Re-eval: 04/14/13 Assessment Diagnosis: R TKR  Surgical Date: 02/27/13 Next MD Visit: Dr. Lajoyce Corners MD  04/08/13 Prior Therapy: no   Authorization: [employer] -   Authorization Time Period:    Authorization Visit#: 4 of 12   Subjective: Symptoms/Limitations Symptoms: Pt reports knee is feeling better today.  Reports sleeping is getting better.  Difficulty with sit to stands. Pain Assessment Currently in Pain?: Yes Pain Score: 6  Pain Location: Knee Pain Orientation: Right  Objective   Exercise/Treatments Stretches Passive Hamstring Stretch: 3 reps;30 seconds;Limitations Passive Hamstring Stretch Limitations: manual Hip Flexor Stretch: 4 reps;30 seconds;Limitations Hip Flexor Stretch Limitations: manual Gastroc Stretch: 4 reps;30 seconds;Limitations Gastroc Stretch Limitations: manual Aerobic Stationary Bike: Nustep hill level 3 resistance level 3 x 8 min SPM average 73 Standing Forward Lunges: Both;10 reps Side Lunges: Both;10 reps Other Standing Knee Exercises: lunge to 8" with 3 way reach,  Supine Quad Sets: 10 reps;AROM Heel Slides: 10 reps;AROM Straight Leg Raises: Right;5 sets Knee Extension: 3 sets (Gentle PROM) Knee Flexion: 3 sets (Gentle PROM)  Modalities Modalities: Cryotherapy Manual Therapy Manual Therapy: Other (comment) Joint Mobilization: Manual joint mobs to Rt hip to increase IR/ER rotation, passive hamstring, gastroc and piriformis stretches.  PROM knee flex and extend.  Patella mobs AP and RL Cryotherapy Number Minutes Cryotherapy: 10 Minutes Cryotherapy Location: Knee Type of Cryotherapy: Ice pack  Physical Therapy Assessment and Plan PT Assessment and Plan Clinical Impression Statement: Therapist  facilitation required for proper technique with lunges and squats for maximum gluteal and LE strengthening.  Manual facilitation complete to improve hip mobility and improve knee AROM/PROM.  Ice with elevation applied end of session for pain and edema control.   PT Plan:  Contiue with current POC specifically with lunging activities and functional manual reactions to improve gait.     Goals Home Exercise Program Pt/caregiver will Perform Home Exercise Program: For increased ROM;For increased strengthening;With Supervision, verbal cues required/provided PT Short Term Goals Time to Complete Short Term Goals: 2 weeks PT Short Term Goal 1: imrpove right knee AROM 0- 115 for sitting comfort, gait, stair use  PT Short Term Goal 1 - Progress: Progressing toward goal PT Short Term Goal 2: patient improve right quad strength to 3+/5 for stability  PT Short Term Goal 2 - Progress: Progressing toward goal PT Short Term Goal 3: indepenent bed mobility sit to supine for independence at home  PT Long Term Goals Time to Complete Long Term Goals: 4 weeks PT Long Term Goal 1: patient able to walk with straight cane 10 - 15 min for household and start of community ambulation  PT Long Term Goal 2: Patient able to stand in kitchen 10 + minutes for meal prep  Long Term Goal 3: patient able to perform light household acitivities of cooking and cleaning  Long Term Goal 4: patient able to ascend 5 stairs comfortably with min use of handrial   Problem List Patient Active Problem List   Diagnosis Date Noted  . Abnormality of gait 03/15/2013  . Right knee pain 03/15/2013  . Total knee replacement status 02/27/2013    PT - End of Session Equipment Utilized During Treatment: Gait belt Activity Tolerance: Patient tolerated treatment well General Behavior During Therapy: [facility] for tasks assessed/performed  GP  Juel Burrow 03/22/2013, 3:38 PM

## 2013-03-25 ENCOUNTER — Ambulatory Visit (HOSPITAL_COMMUNITY)
Admission: RE | Admit: 2013-03-25 | Discharge: 2013-03-25 | Disposition: A | Discharge status: Home / Self Care | Payer: 59 | Source: Ambulatory Visit | Attending: Orthopedic Surgery | Admitting: Orthopedic Surgery

## 2013-03-25 DIAGNOSIS — R269 Unspecified abnormalities of gait and mobility: Secondary | ICD-10-CM

## 2013-03-25 NOTE — Progress Notes (Signed)
Physical Therapy Treatment Patient Details  Name: Connie Ross MRN: [id number] Date of Birth: October 01, 1955  Today's Date: 03/25/2013 Time: 0712-1975 PT Time Calculation (min): 45 min Manual x 20 min   TE x 25 min  Visit#: 5 of 12  Re-eval: 04/14/13 Assessment Diagnosis: R TKR  Surgical Date: 02/27/13 Next MD Visit: Dr. Sharol Given MD  04/08/13 Prior Therapy: no   Authorization: [employer] -   Authorization Time Period:    Authorization Visit#: 5 of 12   Subjective: Symptoms/Limitations Symptoms: doing better, 1/10 pain with meds right knee  Pertinent History: cane use prior to surgery, failed knee conservative therapy    Exercise/Treatments         Aerobic Stationary Bike: bike 8 min  reverse and half revolutions     Seated Long Arc Quad: Right;2 sets;10 reps Other Seated Knee Exercises: seated t band right hs curls red 2 x 10  Supine Quad Sets: 10 reps;AROM;2 sets Short Arc Quad Sets: Right;2 sets;10 reps Heel Slides: Right;2 sets;10 reps Straight Leg Raises: Right;10 reps Knee Extension: 3 sets Knee Flexion: 3 sets   Manual Therapy Joint Mobilization: manual knee extesnion stretch, manusl calf stretch, PA tibia mobs for knee extension, PROM flexion x 20 min   Physical Therapy Assessment and Plan PT Assessment and Plan Clinical Impression Statement: still lacks strong quadriceps contractions, AROM improved 4- 110 post visit  , strong cues for TE and quadriceps activcation, 3 degrees laq SLR  PT Plan:  Contiue with current POC specifically with lunging activities and functional manual reactions to improve gait.     Goals Home Exercise Program Pt/caregiver will Perform Home Exercise Program: For increased ROM;For increased strengthening;With Supervision, verbal cues required/provided PT Short Term Goals Time to Complete Short Term Goals: 2 weeks PT Short Term Goal 1: imrpove right knee AROM 0- 115 for sitting comfort, gait, stair use  PT Short Term Goal 1 -  Progress: Progressing toward goal PT Short Term Goal 2: patient improve right quad strength to 3+/5 for stability  PT Short Term Goal 2 - Progress: Progressing toward goal PT Short Term Goal 3: indepenent bed mobility sit to supine for independence at home  PT Short Term Goal 3 - Progress: Met PT Long Term Goals Time to Complete Long Term Goals: 4 weeks PT Long Term Goal 1: patient able to walk with straight cane 10 - 15 min for household and start of community ambulation  PT Long Term Goal 1 - Progress: Progressing toward goal PT Long Term Goal 2: Patient able to stand in kitchen 10 + minutes for meal prep  PT Long Term Goal 2 - Progress: Progressing toward goal Long Term Goal 3: patient able to perform light household acitivities of cooking and cleaning  Long Term Goal 3 Progress: Progressing toward goal Long Term Goal 4: patient able to ascend 5 stairs comfortably with min use of handrial  Long Term Goal 4 Progress: Progressing toward goal  Problem List Patient Active Problem List   Diagnosis Date Noted  . Abnormality of gait 03/15/2013  . Right knee pain 03/15/2013  . Total knee replacement status 02/27/2013    PT - End of Session Activity Tolerance: Patient tolerated treatment well PT Plan of Care Consulted and Agree with Plan of Care: Patient  GP    Issacc Merlo 03/25/2013, 3:06 PM

## 2013-03-27 ENCOUNTER — Ambulatory Visit (HOSPITAL_COMMUNITY)
Admission: RE | Admit: 2013-03-27 | Discharge: 2013-03-27 | Disposition: A | Discharge status: Home / Self Care | Payer: 59 | Source: Ambulatory Visit | Attending: Family Medicine | Admitting: Family Medicine

## 2013-03-27 DIAGNOSIS — R269 Unspecified abnormalities of gait and mobility: Secondary | ICD-10-CM

## 2013-03-27 NOTE — Progress Notes (Signed)
Physical Therapy Treatment Patient Details  Name: Connie Ross MRN: [id number] Date of Birth: 05-30-1955  Today's Date: 03/27/2013 Time: 2536-6440 PT Time Calculation (min): 48 min Charge: TE 1300-1308, [id number]; Gait [phone],  Manual [phone]  Visit#: 6 of 12  Re-eval:   Assessment Diagnosis: R TKR  Surgical Date: 02/27/13 Next MD Visit: Dr. Lajoyce Corners MD  04/08/13 Prior Therapy: no   Authorization: [employer] -   Authorization Time Period:    Authorization Visit#: 6 of 12   Subjective: Symptoms/Limitations Symptoms: Pt reported knee is feeling better today, stated walking is getting easier with walker, continues to have difficulty with a cane. Pain Assessment Currently in Pain?: Yes Pain Score: 6  Pain Location: Knee Pain Orientation: Right  Objective:  Exercise/Treatments Stretches Passive Hamstring Stretch: 3 reps;30 seconds;Limitations Passive Hamstring Stretch Limitations: manual Hip Flexor Stretch: 4 reps;30 seconds;Limitations Hip Flexor Stretch Limitations: manual Gastroc Stretch: 4 reps;30 seconds;Limitations Gastroc Stretch Limitations: manual Aerobic Stationary Bike: bike 8 min  reverse and half revolutions  Standing Forward Lunges: Both;10 reps;Limitations Forward Lunges Limitations: therapist facilitation Side Lunges: Both;10 reps;Limitations Side Lunges Limitations: therapist facilitation Gait Training: Gait training x 158 feet cueing for proper sequencing with cane, equal stride length, heel to toe pattern, knee flexion  Seated Long Arc Quad: Right;2 sets;15 reps Other Seated Knee Exercises: seated green t band right hs curls 15x Supine Quad Sets: 2 sets;10 reps Short Arc Quad Sets: 15 reps;Limitations Short Arc Quad Sets Limitations: 5" holds 3# Straight Leg Raises: Right;15 reps Knee Extension: 3 sets Knee Flexion: 3 sets   Manual Therapy Manual Therapy: Other (comment) Joint Mobilization: Patella tracking all directions, tib/fib joint  mobs, PROM flexion and extension Other Manual Therapy: Instructed scar tissue massage to daughter to reduced incisions and assist with ROM; educated on avoiding incision to reduce risk of infection  Physical Therapy Assessment and Plan PT Assessment and Plan Clinical Impression Statement: Gait training with SPC, min assistance with constant cueing for sequencing and heel to toe gait pattern.  Resumed standing lunges for stabilty, strengthening and to increase functional AROM, therapist facilitation required to improve form and technique.  Pt continues to have weak quad musculature, tactile and verbal cueing complete to improve activation and increase contraction.  AROM 3-110 following manual techniques including PROM 3 reps both directions, instructed scar tissue massage to daughter with education to reduce risk of infection to incision and joint mobility to improve patella tracking with gait.   PT Plan:  Contiue with current POC specifically with lunging activities and functional manual reactions to improve gait.     Goals Home Exercise Program Pt/caregiver will Perform Home Exercise Program: For increased ROM;For increased strengthening;With Supervision, verbal cues required/provided PT Short Term Goals Time to Complete Short Term Goals: 2 weeks PT Short Term Goal 1: imrpove right knee AROM 0- 115 for sitting comfort, gait, stair use  PT Short Term Goal 1 - Progress: Progressing toward goal PT Short Term Goal 2: patient improve right quad strength to 3+/5 for stability  PT Short Term Goal 2 - Progress: Progressing toward goal PT Short Term Goal 3: indepenent bed mobility sit to supine for independence at home  PT Long Term Goals Time to Complete Long Term Goals: 4 weeks PT Long Term Goal 1: patient able to walk with straight cane 10 - 15 min for household and start of community ambulation  PT Long Term Goal 1 - Progress: Progressing toward goal PT Long Term Goal 2: Patient able to stand in  kitchen  10 + minutes for meal prep  Long Term Goal 3: patient able to perform light household acitivities of cooking and cleaning  Long Term Goal 4: patient able to ascend 5 stairs comfortably with min use of handrial   Problem List Patient Active Problem List   Diagnosis Date Noted  . Abnormality of gait 03/15/2013  . Right knee pain 03/15/2013  . Total knee replacement status 02/27/2013    PT - End of Session Equipment Utilized During Treatment: Gait belt Activity Tolerance: Patient tolerated treatment well;Patient limited by fatigue General Behavior During Therapy: [facility] for tasks assessed/performed  GP    Juel Burrow 03/27/2013, 2:24 PM

## 2013-03-29 ENCOUNTER — Ambulatory Visit (HOSPITAL_COMMUNITY)
Admission: RE | Admit: 2013-03-29 | Discharge: 2013-03-29 | Disposition: A | Discharge status: Home / Self Care | Payer: 59 | Source: Ambulatory Visit | Attending: Orthopedic Surgery | Admitting: Orthopedic Surgery

## 2013-03-29 DIAGNOSIS — R269 Unspecified abnormalities of gait and mobility: Secondary | ICD-10-CM

## 2013-03-29 NOTE — Progress Notes (Signed)
Physical Therapy Treatment Patient Details  Name: Connie Ross MRN: [id number] Date of Birth: 12-Oct-1955  Today's Date: 03/29/2013 Time: 2725-3664 PT Time Calculation (min): 45 min TE 1300- 1345  Visit#: 7 of 12  Re-eval: 04/14/13 Assessment Diagnosis: R TKR  Surgical Date: 02/27/13 Next MD Visit: Dr. Lajoyce Corners MD  04/08/13 Prior Therapy: no   Authorization: [employer] -   Authorization Time Period:    Authorization Visit#: 7 of 12   Subjective:     Exercise/Treatments       Stretches   Aerobic Stationary Bike: Nu step level 3 8 min Machines for Strengthening   Plyometrics   Standing Heel Raises: 10 reps Knee Flexion: 2 sets;10 reps Forward Lunges: R 10 reps;1 HHA  Other Standing Knee Exercises: standing no UE support 30 sec x 2 , modified tandem standing 20 sec x 2 1 HHA  Seated Long Arc Quad: Right;2 sets;15 reps Other Seated Knee Exercises: seated green t band right hs curls 15x Supine Quad Sets: 2 sets;10 reps Short Arc Quad Sets: 15 reps;Limitations Heel Slides: Right;2 sets;10 reps Straight Leg Raises: Right;15 reps Sidelying Hip ABduction: 10 reps Prone         Physical Therapy Assessment and Plan PT Assessment and Plan Clinical Impression Statement: patient deomonstrated significant left LE antalgic gait with notable trunk lean to left with trial of cane use in clinic this visit and reported 7+/10 left knee pain. therefrore educated to use walker only due to antalgia.  PT Plan:  Contiue with current POC specifically with lunging activities and functional manual reactions to improve gait. balane exercises, quadriceps strengthening     Goals    Problem List Patient Active Problem List   Diagnosis Date Noted  . Abnormality of gait 03/15/2013  . Right knee pain 03/15/2013  . Total knee replacement status 02/27/2013    PT - End of Session Equipment Utilized During Treatment: Gait belt  GP    Connie Ross 03/29/2013, 3:14 PM

## 2013-04-01 ENCOUNTER — Ambulatory Visit (HOSPITAL_COMMUNITY)
Admission: RE | Admit: 2013-04-01 | Discharge: 2013-04-01 | Disposition: A | Discharge status: Home / Self Care | Payer: 59 | Source: Ambulatory Visit | Attending: Family Medicine | Admitting: Family Medicine

## 2013-04-01 DIAGNOSIS — R269 Unspecified abnormalities of gait and mobility: Secondary | ICD-10-CM

## 2013-04-01 NOTE — Progress Notes (Signed)
Physical Therapy Treatment Patient Details  Name: Connie Ross MRN: [id number] Date of Birth: Feb 20, 1955  Today's Date: 04/01/2013 Time: 1300-1345 PT Time Calculation (min): 45 min Charges: Therapeutic exercise 1310-1345, Manual 1300-1310  Visit#: 8 of 12  Re-eval: 04/14/13 Assessment Diagnosis: R TKR  Surgical Date: 02/27/13 Next MD Visit: Dr. Sharol Given MD  04/08/13 Prior Therapy: no   Authorization: [employer] -   Authorization Time Period:    Authorization Visit#: 8 of 12   Subjective: Symptoms/Limitations Symptoms: L knee pain > R knee pain, patient states she can really feel the difference in how bad her non-post-surgical leg is now.  Pertinent History: cane use prior to surgery, failed knee conservative therapy Limitations: Standing;Walking;House hold activities How long can you walk comfortably?: 3-5 min with rolling walker  Patient Stated Goals: decrease pain, improve strength , return to work, return to household activities  Pain Assessment Currently in Pain?: Yes Pain Score: 2  Pain Location: Knee (L knee pain is 5/10) Pain Orientation: Right Pain Type: Acute pain;Surgical pain Pain Frequency: Constant  Precautions/Restrictions     Exercise/Treatments Stretches Active Hamstring Stretch:  (10x 3seconds) Hip Flexor Stretch:  (10x3sec) Gastroc Stretch: 60 seconds   Standing Forward Lunges: 20 reps (with 3 way reach to 14inch box) Gait Training: Gait training x 158 feet cueing for proper sequencing with cane, equal stride length, heel to toe pattern, knee flexion  Other Standing Knee Exercises: 3D hip excursions 10x Other Standing Knee Exercises: Forward lunge to 8" box 2x 20 with FMR tio improve TZ2 hip Ext/ER, and TZ1 tibial/knee/hip IR  Manual Therapy Manual Therapy: Joint mobilization Joint Mobilization: Talus AP grade 2 Mobilization with movement to increase dorsiflexion. Functional manual reaction to improve hip extension and external rotation in TZ2 of  gait.   Physical Therapy Assessment and Plan PT Assessment and Plan Clinical Impression Statement: Patient displays decreased antalgic gait with more equal stride length and increased stride length resultign in mproved gait, patient notes that her pain with walking was significantly improved following lunging activities with functional manual reaction techniques to improve tibial, femoral, and hip IR at foot strike and hip/knee Extension/ER at toe off.  Patient requires continued cuing for correct cane use for cane placement.  Pt will benefit from skilled therapeutic intervention in order to improve on the following deficits: Abnormal gait;Decreased activity tolerance;Difficulty walking;Decreased strength;Decreased range of motion;Impaired UE functional use;Pain Rehab Potential: Good PT Frequency: Min 3X/week PT Duration: 4 weeks PT Treatment/Interventions: Gait training;Stair training;Functional mobility training;Therapeutic activities;Therapeutic exercise;Manual techniques;Patient/family education;Neuromuscular re-education;Balance training;Modalities PT Plan:  Contiue with current POC specifically with lunging activities and functional manual reactions to improve gait. balane exercises, quadriceps strengthening. increase depth of loading exercises (specifically lunges and squats).  Add piriformis Strech    Goals Home Exercise Program Pt/caregiver will Perform Home Exercise Program: For increased ROM;For increased strengthening;With Supervision, verbal cues required/provided PT Short Term Goals Time to Complete Short Term Goals: 2 weeks PT Short Term Goal 1: imrpove right knee AROM 0- 115 for sitting comfort, gait, stair use  PT Short Term Goal 1 - Progress: Progressing toward goal PT Short Term Goal 2: patient improve right quad strength to 3+/5 for stability  PT Short Term Goal 2 - Progress: Progressing toward goal PT Short Term Goal 3: indepenent bed mobility sit to supine for independence  at home  PT Short Term Goal 3 - Progress: Met PT Long Term Goals Time to Complete Long Term Goals: 4 weeks PT Long Term Goal 1: patient able to  walk with straight cane 10 - 15 min for household and start of community ambulation  PT Long Term Goal 1 - Progress: Progressing toward goal PT Long Term Goal 2: Patient able to stand in kitchen 10 + minutes for meal prep  PT Long Term Goal 2 - Progress: Progressing toward goal Long Term Goal 3: patient able to perform light household acitivities of cooking and cleaning  Long Term Goal 3 Progress: Progressing toward goal Long Term Goal 4: patient able to ascend 5 stairs comfortably with min use of handrial  Long Term Goal 4 Progress: Progressing toward goal  Problem List Patient Active Problem List   Diagnosis Date Noted  . Abnormality of gait 03/15/2013  . Right knee pain 03/15/2013  . Total knee replacement status 02/27/2013    PT - End of Session Equipment Utilized During Treatment: Gait belt Activity Tolerance: Patient tolerated treatment well;Patient limited by fatigue General Behavior During Therapy: [facility] for tasks assessed/performed PT Plan of Care PT Home Exercise Plan: added knee extension mobilzation to HEP with education of daughter for correct performance.  PT Patient Instructions: written HEP provide, daugher interprets  Consulted and Agree with Plan of Care: Patient  GP    Leia Alf PT DPT 04/01/2013, 2:27 PM

## 2013-04-03 ENCOUNTER — Ambulatory Visit (HOSPITAL_COMMUNITY)
Admission: RE | Admit: 2013-04-03 | Discharge: 2013-04-03 | Disposition: A | Discharge status: Home / Self Care | Payer: 59 | Source: Ambulatory Visit | Attending: Family Medicine | Admitting: Family Medicine

## 2013-04-03 DIAGNOSIS — Z79899 Other long term (current) drug therapy: Secondary | ICD-10-CM | POA: Insufficient documentation

## 2013-04-03 DIAGNOSIS — K219 Gastro-esophageal reflux disease without esophagitis: Secondary | ICD-10-CM | POA: Insufficient documentation

## 2013-04-03 DIAGNOSIS — IMO0001 Reserved for inherently not codable concepts without codable children: Secondary | ICD-10-CM | POA: Insufficient documentation

## 2013-04-03 DIAGNOSIS — E119 Type 2 diabetes mellitus without complications: Secondary | ICD-10-CM | POA: Insufficient documentation

## 2013-04-03 DIAGNOSIS — M25669 Stiffness of unspecified knee, not elsewhere classified: Secondary | ICD-10-CM | POA: Insufficient documentation

## 2013-04-03 DIAGNOSIS — M25569 Pain in unspecified knee: Secondary | ICD-10-CM | POA: Insufficient documentation

## 2013-04-03 DIAGNOSIS — R269 Unspecified abnormalities of gait and mobility: Secondary | ICD-10-CM

## 2013-04-03 DIAGNOSIS — I10 Essential (primary) hypertension: Secondary | ICD-10-CM | POA: Insufficient documentation

## 2013-04-03 NOTE — Progress Notes (Signed)
Physical Therapy Treatment Patient Details  Name: Connie Ross MRN: [id number] Date of Birth: 1955/07/07  Today's Date: 04/03/2013 Time: 1300-1350 PT Time Calculation (min): 50 min Manual 1300 - 7829  5621 - 47 TE  Visit#: 9 of 12  Re-eval: 04/14/13 Assessment Diagnosis: R TKR  Surgical Date: 02/27/13 Next MD Visit: Dr. Sharol Given MD  04/08/13 Prior Therapy: no   Authorization: [employer] -  initally required letter of med necessity   Authorization Time Period:    Authorization Visit#: 9 of 12   Subjective: Symptoms/Limitations Symptoms: L knee pain > R knee pain, patient states she can really feel the difference in how bad her non-post-surgical leg is now., pleased wtih progress of her right knee, right kne pain qabout 5-6/10, walked outsdie yesterday, was walking about 40 min  Pertinent History: cane use prior to surgery, failed knee conservative therapy, R TKR 02/28/12, no prior home PT  Limitations: Standing;Walking;House hold activities  Precautions/Restrictions     Exercise/Treatments Mobility/Balance        Stretches Passive Hamstring Stretch: 3 reps;30 seconds;Limitations Passive Hamstring Stretch Limitations: manual Gastroc Stretch: 5 reps;20 seconds Gastroc Stretch Limitations: manual Therapist faciliated knee flexion stretch using 8 in box , B LE    Aerobic Stationary Bike: Nu step level 3 8 min Machines for Strengthening   Plyometrics   Standing Heel Raises: 10 reps Knee Flexion: 2 sets;10 reps Forward Lunges: 20 reps Forward Lunges Limitations: therapist facilitation Lateral Step Up: Right;15 reps;Hand Hold: 1;Step Height: 4" Forward Step Up: Right;Step Height: 4";15 reps;Hand Hold: 1 Seated   Supine Short Arc Quad Sets: 15 reps;Limitations Heel Slides: Right;2 sets;10 reps Sidelying   Prone      Manual Therapy x 25 min  Joint Mobilization: talus AP grade 2 mobs right to increase DF, tibia AP glides for knee extension, PROM flexion, scar  massage, patellar mobs, manual calf and hamstring stretches : manula x 25 min   Physical Therapy Assessment and Plan PT Assessment and Plan Clinical Impression Statement: left knee limited pateint more due to pain then right knee, right knee AROM has improved to 3-118. further rehab to improve strenght and functinla abilities related to ADLS, balance, and walking  Pt will benefit from skilled therapeutic intervention in order to improve on the following deficits: Abnormal gait;Decreased range of motion;Decreased strength PT Treatment/Interventions: Gait training;Stair training;Functional mobility training;Therapeutic activities;Therapeutic exercise;Manual techniques;Patient/family education;Neuromuscular re-education;Balance training;Modalities PT Plan:  Contiue with current POC specifically with lunging activities and functional manual reactions to improve gait. balane exercises, quadriceps strengthening. increase depth of loading exercises (specifically lunges and squats).  Add piriformis Strech, return to dr note next visit, plan to continue 2-3 week for 4 weeks     Goals PT Short Term Goals Time to Complete Short Term Goals: 2 weeks PT Short Term Goal 1: imrpove right knee AROM 0- 115 for sitting comfort, gait, stair use  PT Short Term Goal 1 - Progress: Progressing toward goal PT Short Term Goal 2: patient improve right quad strength to 3+/5 for stability  PT Short Term Goal 2 - Progress: Progressing toward goal PT Short Term Goal 3: indepenent bed mobility sit to supine for independence at home  PT Short Term Goal 3 - Progress: Met PT Long Term Goals Time to Complete Long Term Goals: 4 weeks PT Long Term Goal 1: patient able to walk with straight cane 10 - 15 min for household and start of community ambulation  PT Long Term Goal 1 - Progress: Not met PT Long  Term Goal 2: Patient able to stand in kitchen 10 + minutes for meal prep  PT Long Term Goal 2 - Progress: Progressing toward  goal Long Term Goal 3: patient able to perform light household acitivities of cooking and cleaning  Long Term Goal 3 Progress: Progressing toward goal Long Term Goal 4: patient able to ascend 5 stairs comfortably with min use of handrial  Long Term Goal 4 Progress: Progressing toward goal  Problem List Patient Active Problem List   Diagnosis Date Noted  . Abnormality of gait 03/15/2013  . Right knee pain 03/15/2013  . Total knee replacement status 02/27/2013    PT - End of Session Activity Tolerance: Patient tolerated treatment well;Patient limited by pain General Behavior During Therapy: [facility] for tasks assessed/performed PT Plan of Care Consulted and Agree with Plan of Care: Patient  GP    Edye Hainline 04/03/2013, 2:11 PM

## 2013-04-05 ENCOUNTER — Ambulatory Visit (HOSPITAL_COMMUNITY)
Admission: RE | Admit: 2013-04-05 | Discharge: 2013-04-05 | Disposition: A | Discharge status: Home / Self Care | Payer: 59 | Source: Ambulatory Visit | Attending: Orthopedic Surgery | Admitting: Orthopedic Surgery

## 2013-04-05 DIAGNOSIS — R269 Unspecified abnormalities of gait and mobility: Secondary | ICD-10-CM

## 2013-04-05 NOTE — Evaluation (Signed)
Physical Therapy Evaluation  Patient Details  Name: Connie Ross MRN: [id number] Date of Birth: Aug 27, 1955  Today's Date: 04/05/2013 Time: 1300-1350 PT Time Calculation (min): 50 min    1300 - 1315 manual  3 - 1350 TE           Visit#: 10 of 24  Re-eval: 05/05/13 Assessment Diagnosis: R TKR  Surgical Date: 02/27/13 Next MD Visit: Dr. Sharol Given MD  04/08/13 Prior Therapy: no   Authorization: [employer] -  initally required letter of med necessity     Authorization Time Period:    Authorization Visit#: 10 of 24   Past Medical History:  Past Medical History  Diagnosis Date  . Hypertension   . GERD (gastroesophageal reflux disease)   . High cholesterol   . Type II diabetes mellitus   . Rheumatoid arthritis    Past Surgical History:  Past Surgical History  Procedure Laterality Date  . Total knee arthroplasty Right 02/28/2013  . Total knee arthroplasty Right 02/27/2013    Procedure: TOTAL KNEE ARTHROPLASTY- knee;  Surgeon: Newt Minion, MD;  Location: [facility];  Service: Orthopedics;  Laterality: Right;  Right Total Knee Arthroplasty    Subjective Symptoms/Limitations Symptoms: L knee pain > R knee pain ( R knee is surgical knee), patient states she can really feel the difference in how bad her non-post-surgical leg is now ( however better this week than last)., pleased wtih progress of her right knee, right kne pain qabout 4 /10, walked outsdie yesterday, dr appt monday  Pertinent History: cane use prior to surgery, failed knee conservative therapy, R TKR 02/28/12, no prior home PT , left knee DJD  How long can you walk comfortably?: 30 with rolling walker  Patient Stated Goals: decrease pain, improve strength , return to work, return to household activities  Pain Assessment Pain Score: 4  Pain Location: Knee Pain Orientation: Right Pain Type: Surgical pain Pain Frequency: Constant    Assessment RLE AROM (degrees) Right Knee Extension: 2 (was 8)  Right Knee Flexion: 118   (was 105)  RLE PROM (degrees) Right Knee Extension: 0 Right Knee Flexion: 120 RLE Strength Right Knee Flexion: 4/5 Right Knee Extension: 3+/5  Exercise/Treatments Mobility/Balance  Ambulation/Gait Ambulation/Gait: Yes Gait Pattern: Step-through pattern;Decreased hip/knee flexion - right;Decreased stance time - left;Antalgic   Stretches Active Hamstring Stretch: 5 reps;10 seconds;Limitations Active Hamstring Stretch Limitations: B standing stool stretch  Passive Hamstring Stretch: 2 reps;30 seconds Passive Hamstring Stretch Limitations: manual Knee: Self-Stretch to increase Flexion: 5 reps;Limitations Knee: Self-Stretch Limitations: forward stretch lunge onto 8 inch step  therapy faciliatated  Gastroc Stretch: 5 reps;10 seconds;Limitations Gastroc Stretch Limitations: incline stretch  Aerobic Stationary Bike: Nu step level 3 8 min    Standing Heel Raises: 10 reps Knee Flexion: 2 sets;10 reps Forward Lunges: 20 reps Forward Lunges Limitations: therapist facilitation Forward Step Up: Right;Step Height: 4";15 reps;Hand Hold: 1 Other Standing Knee Exercises: 3D hip excursions 10x, standing weight shifting on balance pad non HHA x 1 min  Seated Long Arc Quad: Right;2 sets;15 reps Supine Quad Sets: 2 sets;10 reps Short Arc Quad Sets: 15 reps;Limitations Heel Slides: Right;2 sets;10 reps Sidelying Hip ABduction: 10 reps    Manual Therapy Manual Therapy: Myofascial release Joint Mobilization: talus AP grade 2 mobs R to ncrease DF , manulal knee extension stretch, tibia AP glidesfor knee extension, manual calf stretch   Other Manual Therapy: retro massage for swelling , myofasial techniques distal quad   Physical Therapy Assessment and Plan PT Assessment and  Plan Clinical Impression Statement: patient steadily improving post right TKR 02/27/13 with current left knee DJD . She is improving her ROM and strengt however progiression to cane curently limited per left knee pain. She  requires further  skilled outpatient therapy to reach goals listed and return to work. she had long standing B knee DJD prior to surgery with cane use. She is motivated to further improve. Improved quadriceps strength is required at this time of optimal outcome  PT Treatment/Interventions: Gait training;Stair training;Therapeutic activities;Therapeutic exercise;Manual techniques;Neuromuscular re-education;Balance training;Modalities PT Plan: continue 2-3 week for 4 weeks, continue strengthening and continue lunging activities, progres into balance and gait with cane as tolerate, add piriformis stretch , increase depth of loading exercises     Goals PT Short Term Goals Time to Complete Short Term Goals: 2 weeks PT Short Term Goal 1: imrpove right knee AROM 0- 115 for sitting comfort, gait, stair use  PT Short Term Goal 1 - Progress: Progressing toward goal PT Short Term Goal 2: patient improve right quad strength to 3+/5 for stability  PT Short Term Goal 2 - Progress: Progressing toward goal PT Short Term Goal 3: indepenent bed mobility sit to supine for independence at home  PT Short Term Goal 3 - Progress: Met PT Long Term Goals Time to Complete Long Term Goals: 4 weeks PT Long Term Goal 1: patient able to walk with straight cane 10 - 15 min for household and start of community ambulation  PT Long Term Goal 1 - Progress: Not met PT Long Term Goal 2: Patient able to stand in kitchen 10 + minutes for meal prep  PT Long Term Goal 2 - Progress: Met Long Term Goal 3: patient able to perform light household acitivities of cooking and cleaning  Long Term Goal 3 Progress: Progressing toward goal Long Term Goal 4: patient able to ascend 5 stairs comfortably with min use of handrial  Long Term Goal 4 Progress: Not met PT Long Term Goal 5: Improve R quadriceps  MMt to 4/5 for improved stability with possible upcoming left TKR - new goals set today  Long Term Goal 5 Progress: Progressing toward  goal  Problem List Patient Active Problem List   Diagnosis Date Noted  . Abnormality of gait 03/15/2013  . Right knee pain 03/15/2013  . Total knee replacement status 02/27/2013    General Behavior During Therapy: [facility] for tasks assessed/performed PT Plan of Care PT Patient Instructions: updated HEP written provided  Consulted and Agree with Plan of Care: Patient;Family member/caregiver  GP    Tysen Roesler 04/05/2013, 4:22 PM  Physician Documentation Your signature is required to indicate approval of the treatment plan as stated above.  Please sign and either send electronically or make a copy of this report for your files and return this physician signed original.   Please mark one 1.__approve of plan  2. ___approve of plan with the following conditions.   ______________________________                                                          _____________________ Physician Signature  Date  

## 2013-04-08 ENCOUNTER — Ambulatory Visit (HOSPITAL_COMMUNITY)
Admission: RE | Admit: 2013-04-08 | Discharge: 2013-04-08 | Disposition: A | Discharge status: Home / Self Care | Payer: 59 | Source: Ambulatory Visit | Attending: Family Medicine | Admitting: Family Medicine

## 2013-04-08 ENCOUNTER — Ambulatory Visit (HOSPITAL_COMMUNITY)
Admission: RE | Admit: 2013-04-08 | Discharge: 2013-04-08 | Disposition: A | Discharge status: Home / Self Care | Payer: 59 | Source: Ambulatory Visit | Attending: Orthopedic Surgery | Admitting: Orthopedic Surgery

## 2013-04-08 ENCOUNTER — Other Ambulatory Visit (HOSPITAL_COMMUNITY): Payer: Self-pay | Admitting: Orthopedic Surgery

## 2013-04-08 DIAGNOSIS — M79609 Pain in unspecified limb: Secondary | ICD-10-CM | POA: Insufficient documentation

## 2013-04-08 DIAGNOSIS — M25569 Pain in unspecified knee: Secondary | ICD-10-CM

## 2013-04-08 DIAGNOSIS — M7989 Other specified soft tissue disorders: Secondary | ICD-10-CM

## 2013-04-08 NOTE — Progress Notes (Addendum)
*  PRELIMINARY RESULTS* Vascular Ultrasound Right lower extremity venous duplex has been completed.  Preliminary findings: Evidence of DVT in a single, isolated right gastroc vein. Baker's cyst also noted on the right.  12:10pm Called results to Lauren. Patient will wait here while Lauren will talk to Dr. Lajoyce Corners and call us back with instructions for patient. 12:20pm Lauren called - she will send in Rx for xarelto today for patient to pick up. And will call patient to set up appt for next week. Relayed instructions to patient.   Farrel Demark, RDMS, RVT  04/08/2013, 12:13 PM

## 2013-04-08 NOTE — Progress Notes (Signed)
  Patient Details  Name: Teana Lindahl MRN: [id number] Date of Birth: Feb 21, 1955  Today's Date: 04/08/2013 Time:1625  Pt came to therapy appointment.  Discussed recent findings from today's [place] study (positive Rt DVT) with daughter and patient and decided to cancel for today.  Daughter reports they were not given specific instructions regarding therapy.  Called Dr. Audrie Lia office and left message with receptionist regarding therapy for this week.  Pt returns on Friday.  Await clearance from MD.  Lurena Nida, PTA/CLT 04/08/2013, 4:35 PM

## 2013-04-10 ENCOUNTER — Ambulatory Visit (HOSPITAL_COMMUNITY): Payer: 59 | Admitting: Physical Therapy

## 2013-04-10 ENCOUNTER — Telehealth (HOSPITAL_COMMUNITY): Payer: Self-pay | Admitting: Physical Therapy

## 2013-04-12 ENCOUNTER — Inpatient Hospital Stay (HOSPITAL_COMMUNITY)
Admission: RE | Admit: 2013-04-12 | Discharge: 2013-04-12 | Disposition: A | Discharge status: Home / Self Care | Payer: 59 | Source: Ambulatory Visit | Attending: Physical Therapy | Admitting: Physical Therapy

## 2013-04-12 NOTE — Progress Notes (Signed)
  Patient Details  Name: Connie Ross MRN: [id number] Date of Birth: [birth year]  Today's Date: 04/12/2013 Time: 5:05Pm   Patient arrived late for therapy with daughter with herself and her daughter very concerned about safety of therapy following recent finding of blood clot. Patient's Rt leg displays increased swelling and continues to be painful. During walking and weight bearing unrelated to knee TKA. Patient's MD Aldean Baker) was previously called about continuing therapy or placing patient on hold but patient's MD did not respond to call. Patient was instructed to return for next therapy visit after therapist contacts physician to clarify current Therapy orders.    [name] [name] 04/12/2013, 5:13 PM

## 2013-04-15 ENCOUNTER — Inpatient Hospital Stay (HOSPITAL_COMMUNITY): Admission: RE | Admit: 2013-04-15 | Payer: 59 | Source: Ambulatory Visit

## 2013-04-17 ENCOUNTER — Ambulatory Visit (HOSPITAL_COMMUNITY): Payer: 59 | Admitting: Physical Therapy

## 2013-04-19 ENCOUNTER — Ambulatory Visit (HOSPITAL_COMMUNITY)
Admission: RE | Admit: 2013-04-19 | Discharge: 2013-04-19 | Disposition: A | Discharge status: Home / Self Care | Payer: 59 | Source: Ambulatory Visit | Attending: Physical Therapy | Admitting: Physical Therapy

## 2013-04-19 NOTE — Progress Notes (Signed)
Physical Therapy Treatment Patient Details  Name: Connie Ross MRN: [id number] Date of Birth: 04-17-1955  Today's Date: 04/19/2013 Time: 2841-3244 PT Time Calculation (min): 45 min Calculationa: 1645-1700 Manual therapy, 1700-1730 TherEx Visit#: 11 of 24  Re-eval: 05/05/13 Assessment Diagnosis: R TKR  Surgical Date: 02/27/13 Next MD Visit: Dr. Sharol Given MD  04/08/13 Prior Therapy: no   Authorization: [employer] -  initally required letter of med necessity   Authorization Time Period:    Authorization Visit#: 11 of 24   Subjective: Symptoms/Limitations Symptoms: Patient states that her right knee has contineud to feel better than her Lt in the time that she has been away froom therapy due to her having a blood clot in her Rt knee for which she contineus to have residual pain from in in her Rt calf muscles. Patient feels she is becoming stronger and that her strength is returning    Pertinent History: cane use prior to surgery, failed knee conservative therapy, R TKR 02/28/12, no prior home PT , left knee DJD  Limitations: Standing;Walking;House hold activities How long can you walk comfortably?: 30 with quad cane Pain Assessment Pain Score: 3  Pain Location: Knee Pain Orientation: Right  Exercise/Treatments Stretches Active Hamstring Stretch: 3 reps;30 seconds Hip Flexor Stretch: 3 reps;30 seconds Hip Flexor Stretch Limitations: Also groin stretch to 14" Gastroc Stretch: 3 reps;30 seconds Gastroc Stretch Limitations: incline board Aerobic Nustep lvl 2, 7 minutes Standing Forward Lunges: 20 reps Forward Lunges Limitations: 6" box Side Lunges: 10 reps Side Lunges Limitations: 6" box Forward Step Up: 10 reps (6") Other Standing Knee Exercises: Rockerboard left to right 20x Supine Quad Sets: 5 reps Heel Slides: 10 reps   Physical Therapy Assessment and Plan PT Assessment and Plan Clinical Impression Statement: Patient displays improving strength, endurance mobility and  balance as patient is able to now comfortably ambulate with quad caneand beginning to be able to perform more advanced exercises, that he is primarily restricted by ininvolved knee and still limited balance secondary to glut weakness. Current session focused on continueding to improve knee flexibility and progressing strength and balance activities.  Pt will benefit from skilled therapeutic intervention in order to improve on the following deficits: Abnormal gait;Decreased range of motion;Decreased strength Rehab Potential: Good PT Frequency: Min 3X/week PT Duration: 4 weeks PT Treatment/Interventions: Gait training;Stair training;Therapeutic activities;Therapeutic exercise;Manual techniques;Neuromuscular re-education;Balance training;Modalities PT Plan: continue 2-3 visits a week for 4 weeks, continue strengthening and continue lunging activities, progres into balance and gait with cane as tolerate, add piriformis stretch , increase depth of loading exercises     Goals PT Short Term Goals PT Short Term Goal 1: imrpove right knee AROM 0- 115 for sitting comfort, gait, stair use  PT Short Term Goal 1 - Progress: Progressing toward goal PT Short Term Goal 2: patient improve right quad strength to 3+/5 for stability  PT Short Term Goal 2 - Progress: Progressing toward goal PT Short Term Goal 3: indepenent bed mobility sit to supine for independence at home  PT Short Term Goal 3 - Progress: Progressing toward goal PT Long Term Goals PT Long Term Goal 1: patient able to walk with straight cane 10 - 15 min for household and start of community ambulation  PT Long Term Goal 1 - Progress: Progressing toward goal PT Long Term Goal 2: Patient able to stand in kitchen 10 + minutes for meal prep  PT Long Term Goal 2 - Progress: Met Long Term Goal 3: patient able to perform light household acitivities  of cooking and cleaning  Long Term Goal 3 Progress: Progressing toward goal Long Term Goal 4: patient able  to ascend 5 stairs comfortably with min use of handrial  Long Term Goal 4 Progress: Progressing toward goal PT Long Term Goal 5: Improve R quadriceps  MMt to 4/5 for improved stability with possible upcoming left TKR - new goals set today  Long Term Goal 5 Progress: Progressing toward goal  Problem List Patient Active Problem List   Diagnosis Date Noted  . Abnormality of gait 03/15/2013  . Right knee pain 03/15/2013  . Total knee replacement status 02/27/2013    PT - End of Session Equipment Utilized During Treatment: Gait belt Activity Tolerance: Patient tolerated treatment well;Patient limited by pain General Behavior During Therapy: [facility] for tasks assessed/performed  GP    Saree Krogh R Kess Mcilwain PT DPT 04/19/2013, 10:18 PM

## 2013-04-22 ENCOUNTER — Ambulatory Visit (HOSPITAL_COMMUNITY)
Admission: RE | Admit: 2013-04-22 | Discharge: 2013-04-22 | Disposition: A | Discharge status: Home / Self Care | Payer: 59 | Source: Ambulatory Visit | Attending: Family Medicine | Admitting: Family Medicine

## 2013-04-22 NOTE — Progress Notes (Signed)
Physical Therapy Treatment Patient Details  Name: Connie Ross MRN: [id number] Date of Birth: [birth year]  Today's Date: 04/22/2013 Time: 0805-0858 PT Time Calculation (min): 53 min Charges: Therex x 41' [phone]) Ice x 10' [phone])  Visit#: 12 of 24  Re-eval: 05/05/13  Authorization: [employer] -  initally required letter of med necessity   Authorization Visit#: 12 of 24   Subjective: Symptoms/Limitations Symptoms: Pt reports HEP compliance at home. Pt states that she feels "okay" today. Pain Assessment Currently in Pain?: Yes Pain Score: 5  Pain Location: Knee Pain Orientation: Left;Right  Exercise/Treatments  Stretches Active Hamstring Stretch: 3 reps;30 seconds Hip Flexor Stretch: 2 reps;30 seconds Gastroc Stretch: 1 rep;30 seconds;Limitations Gastroc Stretch Limitations: incline board Aerobic Stationary Bike: Nu step level 3 8 min Standing Forward Lunges: 20 reps Side Lunges: 10 reps Supine Knee Extension: AROM;Right;5 reps Knee Flexion: AROM;Right;5 reps  Modalities Modalities: Cryotherapy Cryotherapy Number Minutes Cryotherapy: 10 Minutes Cryotherapy Location: Knee (Right) Type of Cryotherapy: Ice pack Physical Therapy Assessment and Plan PT Assessment and Plan Clinical Impression Statement: SPTA facilitated strengthening and sidestepping gait exercises to increase strength in the pt's LE. Continued NuStep use before exercises to increase mobility of LE. Continued pt's strengthening program per PT POC to improve functional strength. Pt required multimodal cueing and demo to complete therex. Began sidestepping gait exercise to strengthen B hip abductors after a noted hip abductor weakness during gait and exercises. Continued lunge activities and pt would benefit from further lunge activities to increase LE strength and balance. Pt displays lack of confidence with balance during lunge activities on 6" box so lunge activities were facilitated on the floor.  Session focused on LE strengthening and stretching and piriiformis stretch was not completed due to lack of time.  Pt's right knee AROM was measured at 1-120 degrees. Pt rated pain in R knee as 5/10 after exercises. Ice applied to right knee at end of session to limit pain and inflammation. Pt and caregiver were instructed on completing the sidelying hip abduction exercises in HEP at home to increase hip abductor strength for improved gait and LE balance. This entire session was guided, instructed, and directly supervised by Seth Bake, PTA. Pt will benefit from skilled therapeutic intervention in order to improve on the following deficits: Abnormal gait;Decreased range of motion;Decreased strength Rehab Potential: Good PT Frequency: Min 3X/week PT Duration: 4 weeks PT Treatment/Interventions: Gait training;Stair training;Therapeutic activities;Therapeutic exercise;Manual techniques;Neuromuscular re-education;Balance training;Modalities PT Plan: Continue LE strengthening and continue lunging activities, progress into balance and gait with cane as tolerate, add piriformis stretch , increase depth of loading exercises. Focus on strengthening of LE hip abductors next session.     Problem List Patient Active Problem List   Diagnosis Date Noted  . Abnormality of gait 03/15/2013  . Right knee pain 03/15/2013  . Total knee replacement status 02/27/2013    PT - End of Session Equipment Utilized During Treatment: Gait belt Activity Tolerance: Patient tolerated treatment well;Patient limited by pain General Behavior During Therapy: [facility] for tasks assessed/performed   Florence Canner, SPTA 04/22/2013, 11:29 AM

## 2013-04-24 ENCOUNTER — Ambulatory Visit (HOSPITAL_COMMUNITY): Payer: 59 | Admitting: Physical Therapy

## 2013-04-26 ENCOUNTER — Ambulatory Visit (HOSPITAL_COMMUNITY)
Admission: RE | Admit: 2013-04-26 | Discharge: 2013-04-26 | Disposition: A | Discharge status: Home / Self Care | Payer: 59 | Source: Ambulatory Visit | Attending: Physical Therapy | Admitting: Physical Therapy

## 2013-04-26 DIAGNOSIS — R269 Unspecified abnormalities of gait and mobility: Secondary | ICD-10-CM

## 2013-04-26 NOTE — Progress Notes (Signed)
Physical Therapy Treatment Patient Details  Name: Connie Ross MRN: [id number] Date of Birth: [birth year]  Today's Date: 04/26/2013 Time: 1600-1650 PT Time Calculation (min): 50 min  Charges: Manual therapy 1600-1615, TherEx [id number] Visit#: 13 of 24  Re-eval: 05/05/13 Assessment Diagnosis: R TKR  Surgical Date: 02/27/13 Next MD Visit: Dr. Sharol Given MD  04/08/13 Prior Therapy: no   Authorization: [employer] -  initally required letter of med necessity   Authorization Time Period:    Authorization Visit#: 13 of 24   Subjective: Symptoms/Limitations Symptoms: Patient reports she does everything the therapist tells her. notes contineud pain in Lt LE > Rt LE Pertinent History: cane use prior to surgery, failed knee conservative therapy, R TKR 02/28/12, no prior home PT , left knee DJD  Limitations: Standing;Walking;House hold activities How long can you walk comfortably?: 30 with quad cane Patient Stated Goals: decrease pain, improve strength , return to work, return to household activities  Pain Assessment Currently in Pain?: Yes Pain Score: 5  Pain Location: Knee Pain Orientation: Left;Right Pain Type: Chronic pain;Surgical pain Pain Frequency: Constant  Exercise/Treatments Stretches Active Hamstring Stretch: 3 reps;30 seconds Hip Flexor Stretch: 2 reps;30 seconds Piriformis Stretch: 60 seconds;2 reps Gastroc Stretch: 1 rep;30 seconds;Limitations Gastroc Stretch Limitations: incline board Aerobic Stationary Bike: Nu step level 4 5 min Standing Forward Lunges: 20 reps Side Lunges: 10 reps Other Standing Knee Exercises: Rocker board: anterior posterior and lateral  20x each with mod assitance for balance  Physical Therapy Assessment and Plan PT Assessment and Plan Clinical Impression Statement: patient displays improvign gait and strength as she is able to demonstrate improved balance during ambulation with cane. patient demonstrated a positive response to todays exercises  and manual therapy notign decreased pain and symptoms anddisplayed improved gait at end of session. Patient continues to display limitations including difficulty performing sit to stand and limited balance as she has difficult performing weight shfting Lt to and from Rt LE.  Pt will benefit from skilled therapeutic intervention in order to improve on the following deficits: Abnormal gait;Decreased range of motion;Decreased strength Rehab Potential: Good PT Frequency: Min 3X/week PT Duration: 4 weeks PT Treatment/Interventions: Gait training;Stair training;Therapeutic activities;Therapeutic exercise;Manual techniques;Neuromuscular re-education;Balance training;Modalities PT Plan: Continue LE strengthening and continue lunging activities, progress into balance and gait with cane as tolerate, , increase depth of loading exercises. Focus on strengthening of LE hip abductors  and  depth of sit to stand withtou UE support next session.    Goals PT Short Term Goals PT Short Term Goal 1 - Progress: Progressing toward goal PT Short Term Goal 2 - Progress: Met PT Short Term Goal 3 - Progress: Progressing toward goal PT Long Term Goals PT Long Term Goal 1 - Progress: Progressing toward goal PT Long Term Goal 2 - Progress: Met Long Term Goal 3 Progress: Progressing toward goal Long Term Goal 4 Progress: Progressing toward goal Long Term Goal 5 Progress: Progressing toward goal Additional PT Long Term Goals?: Yes PT Long Term Goal 6: Patient will be able to perform 5x sit to stand in 15second with no hands  Problem List Patient Active Problem List   Diagnosis Date Noted  . Abnormality of gait 03/15/2013  . Right knee pain 03/15/2013  . Total knee replacement status 02/27/2013    PT - End of Session Equipment Utilized During Treatment: Gait belt Activity Tolerance: Patient tolerated treatment well;Patient limited by pain PT Plan of Care PT Home Exercise Plan: added knee extension mobilzation to  HEP with education of  daughter for correct performance.  PT Patient Instructions: updated HEP written provided   GP    [name] Nazar Kuan PT DPT 04/26/2013, 4:58 PM

## 2013-05-03 ENCOUNTER — Ambulatory Visit (HOSPITAL_COMMUNITY)
Admission: RE | Admit: 2013-05-03 | Discharge: 2013-05-03 | Disposition: A | Discharge status: Home / Self Care | Payer: 59 | Source: Ambulatory Visit | Attending: Family Medicine | Admitting: Family Medicine

## 2013-05-03 DIAGNOSIS — E119 Type 2 diabetes mellitus without complications: Secondary | ICD-10-CM | POA: Insufficient documentation

## 2013-05-03 DIAGNOSIS — I10 Essential (primary) hypertension: Secondary | ICD-10-CM | POA: Insufficient documentation

## 2013-05-03 DIAGNOSIS — K219 Gastro-esophageal reflux disease without esophagitis: Secondary | ICD-10-CM | POA: Insufficient documentation

## 2013-05-03 DIAGNOSIS — M25669 Stiffness of unspecified knee, not elsewhere classified: Secondary | ICD-10-CM | POA: Insufficient documentation

## 2013-05-03 DIAGNOSIS — M25569 Pain in unspecified knee: Secondary | ICD-10-CM | POA: Insufficient documentation

## 2013-05-03 DIAGNOSIS — IMO0001 Reserved for inherently not codable concepts without codable children: Secondary | ICD-10-CM | POA: Insufficient documentation

## 2013-05-03 DIAGNOSIS — R269 Unspecified abnormalities of gait and mobility: Secondary | ICD-10-CM

## 2013-05-03 DIAGNOSIS — Z79899 Other long term (current) drug therapy: Secondary | ICD-10-CM | POA: Insufficient documentation

## 2013-05-03 NOTE — Progress Notes (Signed)
Physical Therapy Treatment Patient Details  Name: Connie Ross MRN: [id number] Date of Birth: [birth year]  Today's Date: 05/03/2013 Time: 1696-7893 PT Time Calculation (min): 79 min Charge:TE [id number]  Visit#: 14 of 24  Re-eval: 05/05/13 Assessment Diagnosis: R TKR  Surgical Date: 02/27/13 Next MD Visit: Dr. Sharol Given MD  05/13/2013 Prior Therapy: no   Authorization: [employer] -  initally required letter of med necessity   Authorization Time Period:    Authorization Visit#: 14 of 24   Subjective: Symptoms/Limitations Symptoms: Pt c/p increased pain now with Lt LE pain scale 4/10 Bil knees Pain Assessment Currently in Pain?: Yes Pain Score: 4  Pain Location: Knee Pain Orientation: Right;Left  Objective:   Exercise/Treatments Stretches Active Hamstring Stretch: 3 reps;30 seconds;Limitations Active Hamstring Stretch Limitations: standing on 8in step Hip Flexor Stretch: Limitations Hip Flexor Stretch Limitations: 10x 3" for flexion Gastroc Stretch: 3 reps;30 seconds Gastroc Stretch Limitations: slant board Aerobic Stationary Bike: Nu step hill level 3, resistance 4 x 35mn Standing Forward Lunges: 20 reps Side Lunges: 15 reps Lateral Step Up: 15 reps;Hand Hold: 1;Step Height: 4";Both Stairs: 1 RT 2 handrails with max cueing for reciprocal pattern ascending and descending- min assistance Rocker Board: 2 minutes;Limitations Rocker Board Limitations: A/P and R/L 2 min each with mod assistance Other Standing Knee Exercises: Rocker board: anterior posterior and lateral  2 mi each with mod assitance for balance Seated Other Seated Knee Exercises: 3 sets:5 STS no HHA best time 12"     Physical Therapy Assessment and Plan PT Assessment and Plan Clinical Impression Statement: Pt displays improved functional strengthening and is improving gait with LRAD.  Gait training complete this session initially with QC and [facility].   Pt continues to display difficulty performing weight shift Lt <> Rt LE, believed to be related partially to pain Bil knees.  Improved functional strength with reports of no difficulty with bed mobiilty and pt able to complete 5 STS in 12 seconds today with no HHA.  Pt continues to distplay difficulty with stair training requiring mod-max cueing for reciprocal pattern especialy descending.  Pt encouraged to apply ice with elevation for pain and edema control. PT Plan: Re-eval next session.  Continue progressing towards PT POC to improve gait mechanics with LRAD, balance and functional strengthening.      Goals PT Short Term Goals PT Short Term Goal 1: imrpove right knee AROM 0- 115 for sitting comfort, gait, stair use  PT Short Term Goal 1 - Progress: Progressing toward goal PT Short Term Goal 2: patient improve right quad strength to 3+/5 for stability  PT Short Term Goal 2 - Progress: Met PT Short Term Goal 3: indepenent bed mobility sit to supine for independence at home  PT Short Term Goal 3 - Progress: Met PT Long Term Goals PT Long Term Goal 1: patient able to walk with straight cane 10 - 15 min for household and start of community ambulation  PT Long Term Goal 1 - Progress: Progressing toward goal PT Long Term Goal 2: Patient able to stand in kitchen 10 + minutes for meal prep  PT Long Term Goal 2 - Progress: Met Long Term Goal 3: patient able to perform light household acitivities of cooking and cleaning  Long Term Goal 4: patient able to ascend 5 stairs comfortably with min use of handrial  Long Term Goal 4 Progress: Progressing toward goal PT Long Term Goal 5: Improve R quadriceps  MMt to 4/5 for  improved stability with possible upcoming left TKR - new goals set today  Long Term Goal 5 Progress: Progressing toward goal PT Long Term Goal 6: Patient will be able to perform 5x sit to stand in 15second with no hands- MET 12"  Problem List Patient Active Problem List   Diagnosis Date  Noted  . Abnormality of gait 03/15/2013  . Right knee pain 03/15/2013  . Total knee replacement status 02/27/2013    PT - End of Session Equipment Utilized During Treatment: Gait belt Activity Tolerance: Patient limited by pain;Patient tolerated treatment well General Behavior During Therapy: [facility] for tasks assessed/performed  GP    Aldona Lento 05/03/2013, 5:47 PM

## 2013-05-07 ENCOUNTER — Ambulatory Visit (HOSPITAL_COMMUNITY)
Admission: RE | Admit: 2013-05-07 | Discharge: 2013-05-07 | Disposition: A | Discharge status: Home / Self Care | Payer: 59 | Source: Ambulatory Visit | Attending: Physical Therapy | Admitting: Physical Therapy

## 2013-05-07 DIAGNOSIS — R269 Unspecified abnormalities of gait and mobility: Secondary | ICD-10-CM

## 2013-05-07 NOTE — Evaluation (Signed)
Physical Therapy Re-Assessment  Patient Details  Name: Connie Ross MRN: [id number] Date of Birth: 1955/04/03  Today's Date: 05/07/2013 Time: 1735-1820 PT Time Calculation (min): 45 min    Charges: [place] Exercise 1735-1820          Visit#: 15 of 24  Re-eval: 06/06/13 Assessment Diagnosis: R TKR  Surgical Date: 02/27/13 Next MD Visit: Dr. Sharol Given MD  05/13/2013 Prior Therapy: no   Authorization: [employer] -  initally required letter of med necessity     Authorization Time Period:    Authorization Visit#: 15 of 24   Past Medical History:  Past Medical History  Diagnosis Date  . Hypertension   . GERD (gastroesophageal reflux disease)   . High cholesterol   . Type II diabetes mellitus   . Rheumatoid arthritis    Past Surgical History:  Past Surgical History  Procedure Laterality Date  . Total knee arthroplasty Right 02/28/2013  . Total knee arthroplasty Right 02/27/2013    Procedure: TOTAL KNEE ARTHROPLASTY- knee;  Surgeon: Newt Minion, MD;  Location: [place];  Service: Orthopedics;  Laterality: Right;  Right Total Knee Arthroplasty    Subjective Symptoms/Limitations Symptoms: Patiebnt states that she has good and bad days and today is one of the bad days. due to her Lt knee pain. Rt knee only hurts at night and when waking in th morning.  Pertinent History: cane use prior to surgery, failed knee conservative therapy, R TKR 02/28/12, no prior home PT , left knee DJD  Limitations: Standing;Walking;House hold activities How long can you walk comfortably?: 30 with quad cane Patient Stated Goals: decrease pain, improve strength , return to work, return to household activities  Pain Assessment Currently in Pain?: Yes Pain Score: 5  Pain Location: Knee Pain Orientation: Left;Right Pain Type: Chronic pain;Surgical pain Pain Frequency: Intermittent  Assessment RLE AROM (degrees) Right Knee Extension: 0 Right Knee Flexion: 110 RLE Strength Right Knee Flexion:   (4+/5) Right Knee Extension: 5/5  Exercise/Treatments Mobility/Balance  Ambulation/Gait Ambulation/Gait: Yes Ambulation/Gait Assistance: 6: Modified independent (Device/Increase time) Assistive device: Small based quad cane Gait Pattern: Within Functional Limits   Stretches Active Hamstring Stretch: 3 reps;30 seconds;Limitations Active Hamstring Stretch Limitations: standing on 8in step Quad Stretch: 5 reps;10 seconds Hip Flexor Stretch: Limitations;10 seconds;5 reps Hip Flexor Stretch Limitations: 10x 3" for flexion Gastroc Stretch: 3 reps;30 seconds Gastroc Stretch Limitations: slant board Aerobic Stationary Bike: Nu step hill level 3, resistance 4 x 71mn Standing Functional Squat: 10 reps (focus on glut loading) Seated Other Seated Knee Exercises: 3 sets:5 STS no HHA best time 12"     Physical Therapy Assessment and Plan PT Assessment and Plan Clinical Impression Statement: Pt displays improved functional strength and is improving gait with quad cain. Gait training complete this session initially with QC and [facility]. Pt continues to display difficulty performing weight shift to Lt LE, believed to be related partially to pain Bil knees. Improved functional strength with reports of no difficulty with bed mobilty and pt able to complete 5 STS in 12 seconds today with no HHA. Pt continues to display difficulty with stair training requiring mod-max cueing for reciprocal pattern especially descending ; limited by pain in Lt knee. Pt encouraged to apply ice with elevation for pain and edema control. Patient was educated in importance of HEP and stretches to perform to improve Lt knee ROM and pain. Patient will continue to benefit from skilled physical therapy for 2x a week for  3 more weeks to assist patient in returning to work and improving patient ability to perform prolonged standing.  Pt will benefit from skilled therapeutic  intervention in order to improve on the following deficits: Abnormal gait;Decreased range of motion;Decreased strength Rehab Potential: Good PT Frequency: Min 2X/week PT Duration: 4 weeks PT Treatment/Interventions: Gait training;Stair training;Therapeutic activities;Therapeutic exercise;Manual techniques;Neuromuscular re-education;Balance training;Modalities PT Plan: Continue physical therapy for 2x a week for 3 more weeks to assist patient in returning to work. Focus on: improving knee flexion, quadraseps flexibility and glut strength to decrease knee strain during standing activities. Sessions to be performed with predominiantly standing based exercises to increase standing tolerance.     Goals Home Exercise Program Pt/caregiver will Perform Home Exercise Program: For increased ROM;For increased strengthening;With Supervision, verbal cues required/provided PT Goal: Perform Home Exercise Program - Progress: Met PT Short Term Goals PT Short Term Goal 1: imrpove right knee AROM 0- 115 for sitting comfort, gait, stair use  PT Short Term Goal 1 - Progress: Met PT Short Term Goal 2: patient improve right quad strength to 3+/5 for stability  PT Short Term Goal 2 - Progress: Met PT Short Term Goal 3: indepenent bed mobility sit to supine for independence at home  PT Short Term Goal 3 - Progress: Met PT Long Term Goals PT Long Term Goal 1: patient able to walk with straight cane 10 - 15 min for household and start of community ambulation  PT Long Term Goal 1 - Progress: Met PT Long Term Goal 2: Patient able to stand in kitchen 10 + minutes for meal prep  PT Long Term Goal 2 - Progress: Met Long Term Goal 3: patient able to perform light household acitivities of cooking and cleaning  Long Term Goal 3 Progress: Met Long Term Goal 4: patient able to ascend 5 stairs comfortably with min use of handrial  Long Term Goal 4 Progress: Progressing toward goal PT Long Term Goal 5: Improve R quadriceps  MMt  to 4/5 for improved stability with possible upcoming left TKR - new goals set today  Long Term Goal 5 Progress: Progressing toward goal Additional PT Long Term Goals?: Yes PT Long Term Goal 6: Patient will be able to perform 5x sit to stand in 15second with no hands- MET 12" Long Term Goal 6 Progress: Progressing toward goal PT Long Term Goal 7: Patient will be able to stand for 60 minutes with pain <2/10 to progress to returning to work which requires 6 hours of continuous standing.  Long Term Goal 7 Progress: Progressing toward goal  Problem List Patient Active Problem List   Diagnosis Date Noted  . Abnormality of gait 03/15/2013  . Right knee pain 03/15/2013  . Total knee replacement status 02/27/2013    PT - End of Session Equipment Utilized During Treatment: Gait belt Activity Tolerance: Patient limited by pain;Patient tolerated treatment well General Behavior During Therapy: WFL for tasks assessed/performed PT Plan of Care PT Home Exercise Plan: added knee extension mobilzation to HEP with education of daughter for correct performance.  PT Patient Instructions: updated HEP written provided   GP    Leia Alf 05/07/2013, 6:42 PM  Physician Documentation Your signature is required to indicate approval of the treatment plan as stated above.  Please sign and either send electronically or make a copy of this report for your files and return this physician signed original.   Please mark one 1.__approve of plan  2. ___approve of plan with the following conditions.  ______________________________                                                          _____________________ Physician Signature                                                                                                             Date

## 2013-05-13 ENCOUNTER — Ambulatory Visit (HOSPITAL_COMMUNITY): Payer: 59 | Admitting: *Deleted

## 2013-05-17 ENCOUNTER — Ambulatory Visit (HOSPITAL_COMMUNITY)
Admission: RE | Admit: 2013-05-17 | Discharge: 2013-05-17 | Disposition: A | Discharge status: Home / Self Care | Payer: 59 | Source: Ambulatory Visit | Attending: Physical Therapy | Admitting: Physical Therapy

## 2013-05-17 DIAGNOSIS — R269 Unspecified abnormalities of gait and mobility: Secondary | ICD-10-CM

## 2013-05-17 NOTE — Progress Notes (Signed)
Physical Therapy Treatment Patient Details  Name: Connie Ross MRN: [id number] Date of Birth: [birth year]  Today's Date: 05/17/2013 Time: 1030-1314 PT Time Calculation (min): 50 min  Charges: Manual therapy 1735-1745, TherEx 3888-7579 Visit#: 16 of 24  Re-eval: 06/06/13   Authorization: [employer] -  initally required letter of med necessity  Authorization Visit#:  16 of  24   Subjective: Symptoms/Limitations Symptoms: Patient states her right knee has been feeling great, though she notices it still feels weak occasionally. Patinet's primary complaint is now her Lt knee due to pain with weight bearing.  Pain Assessment Currently in Pain?: Yes Pain Score: 4   Exercise/Treatments Stretches Active Hamstring Stretch: 3 reps;30 seconds;Limitations Active Hamstring Stretch Limitations: standing on 8in step Quad Stretch: 5 reps;10 seconds Gastroc Stretch: 3 reps;30 seconds Gastroc Stretch Limitations: slant board Standing Side Lunges: 2 sets;10 reps Stairs: 1 RT 2 handrails with minimal cueing for reciprocal pattern ascending and descending- min assistance Rocker Board: 2 minutes;Limitations Rocker Board Limitations: A/P and R/L 2 min each with mod assistance  Manual Therapy Joint Mobilization: quads, hamstring, ITB, scar mobilizaion, Anterior to posterior and posterior to anterior grade 3 knee mobilizations  Physical Therapy Assessment and Plan PT Assessment and Plan Clinical Impression Statement: Pt displays improved functional strength and is improving gait with quad cain.  Pt continues to display difficulty performing weight shift to Lt LE, believed to be related to pain in Lt knee > right knee. Pt continues to display difficulty with stair training requiring bilateral UE support for reciprocal pattern especially descending ; limited by pain in Lt knee. Pt encouraged to apply ice with elevation for pain and edema control. Patient was educated in importance of HEP and stretches  to perform to improve Lt knee ROM and pain specifically quad and hamstring stretches and lateral lunges. Patient displayed an overall positive response to therapy with decreased pain and improved gait. PT Plan:  Focus on: improving knee flexion, quadraseps flexibility and glut strength to decrease knee strain during standing activities. Sessions to be performed with predominiantly standing based exercises to increase standing tolerance.     Goals PT Long Term Goals Long Term Goal 4: patient able to ascend 5 stairs comfortably with min use of handrial  Long Term Goal 4 Progress: Progressing toward goal PT Long Term Goal 5: Improve R quadriceps  MMt to 4/5 for improved stability with possible upcoming left TKR - new goals set today  Long Term Goal 5 Progress: Progressing toward goal PT Long Term Goal 6: Patient will be able to perform 5x sit to stand in 15second with no hands- MET 12" Long Term Goal 6 Progress: Progressing toward goal PT Long Term Goal 7: Patient will be able to stand for 60 minutes with pain <2/10 to progress to returning to work which requires 6 hours of continuous standing.  Long Term Goal 7 Progress: Progressing toward goal  Problem List Patient Active Problem List   Diagnosis Date Noted  . Abnormality of gait 03/15/2013  . Right knee pain 03/15/2013  . Total knee replacement status 02/27/2013    PT - End of Session Equipment Utilized During Treatment: Gait belt Activity Tolerance: Patient limited by pain;Patient tolerated treatment well General Behavior During Therapy: [facility] for tasks assessed/performed  Rasul Decola R Lorece Keach 05/17/2013, 6:58 PM

## 2013-05-20 ENCOUNTER — Ambulatory Visit (HOSPITAL_COMMUNITY)
Admission: RE | Admit: 2013-05-20 | Discharge: 2013-05-20 | Disposition: A | Discharge status: Home / Self Care | Payer: 59 | Source: Ambulatory Visit

## 2013-05-20 NOTE — Progress Notes (Signed)
Physical Therapy Treatment Patient Details  Name: Connie Ross MRN: [id number] Date of Birth: 1955/06/10  Today's Date: 05/20/2013 Time: 5170-0174 PT Time Calculation (min): 42 min Visit#: 17 of 24  Re-eval: 06/06/13 Charges:  therex 40   Subjective: Daughter is present for treatment today and speaks fluent [place].  Pt does not speak english.  Daughter reports patient does not have any pain in her Rt knee, only her Lt knee.  Daughter states MD is working with insurance now to get approved for Lt TKA.  Pt is going back to full time work at [employer] next Tuesday.   Exercise/Treatments Stretches Active Hamstring Stretch: 3 reps;30 seconds;Limitations Active Hamstring Stretch Limitations: standing on 8in step Gastroc Stretch: 3 reps;30 seconds Gastroc Stretch Limitations: slant board Aerobic Stationary Bike: Nu step hill level 3, resistance 4 x Standing Forward Lunges: 20 reps Side Lunges: 2 sets;10 reps Lateral Step Up: 15 reps;Hand Hold: 1;Step Height: 4";Both Functional Squat: 10 reps Other Standing Knee Exercises: Rocker board: anterior posterior and lateral  2 mi each with mod assitance for balance Seated Other Seated Knee Exercises: 3 sets:5 STS no HHA best time 11.65"    Physical Therapy Assessment and Plan PT Assessment and Plan Clinical Impression Statement: Pt able to complete all exercises without difficulty.  Pt with significant antalgia ambulating without QC, however daughter reports she has limped for years and it is now much better.  Daughter reports patient is able to do all ADL's in home, cook and stand for extended periods of time and is adamant about returning to work next week where she stands for 8 hour shifts. PT Plan: Pt returns to work next week; Assess need for continuance next visit as patient will be getting Lt knee replaced soon.     Problem List Patient Active Problem List   Diagnosis Date Noted  . Abnormality of gait 03/15/2013   . Right knee pain 03/15/2013  . Total knee replacement status 02/27/2013      Lurena Nida, PTA/CLT 05/20/2013, 5:07 PM

## 2013-05-24 ENCOUNTER — Ambulatory Visit (HOSPITAL_COMMUNITY)
Admission: RE | Admit: 2013-05-24 | Discharge: 2013-05-24 | Disposition: A | Discharge status: Home / Self Care | Payer: 59 | Source: Ambulatory Visit | Attending: Physical Therapy | Admitting: Physical Therapy

## 2013-05-24 NOTE — Progress Notes (Signed)
Physical Therapy Treatment Patient Details  Name: Connie Ross MRN: [id number] Date of Birth: 02-13-1955  Today's Date: 05/24/2013 Time: 1740-1820 PT Time Calculation (min): 40 min  Charges: TherEx 1740-1820  Visit#: 18 of 24  Re-eval: 06/06/13   Authorization: [employer] -  initally required letter of med necessity   Authorization Time Period:    Authorization Visit#: 18 of 24   Subjective: Symptoms/Limitations Symptoms: Patient states she is feeling better today.   Exercise/Treatments Stretches Active Hamstring Stretch: 3 reps;30 seconds;Limitations Active Hamstring Stretch Limitations: standing on 8in step Quad Stretch: 5 reps;10 seconds Gastroc Stretch: 3 reps;30 seconds Gastroc Stretch Limitations: slant board Aerobic Stationary Bike: Nu step hill level 3, resistance 4 x 22mn Standing Forward Step Up: 10 reps;Step Height: 8";Step Height: 6";2 sets;Both (6") Other Standing Knee Exercises: Frontal plane squat walk with Red tband [id number]   Physical Therapy Assessment and Plan PT Assessment and Plan Clinical Impression Statement: Patient performed all therapy with litle to minor pain this session. Patient's Lt knee (non surgical side) continues to be her limiting factor due to pain secondary to knee arthitis. Patient continued to require 1 HHA for stair ambulation. Patient ambulated with increased stride length and decreased pain at end of session.     Goals PT Long Term Goals PT Long Term Goal 5: Improve R quadriceps  MMt to 4/5 for improved stability with possible upcoming left TKR - new goals set today  Long Term Goal 5 Progress: Progressing toward goal PT Long Term Goal 6: Patient will be able to perform 5x sit to stand in 15second with no hands- MET 12" Long Term Goal 6 Progress: Met PT Long Term Goal 7: Patient will be able to stand for 60 minutes with pain <2/10 to progress to returning to work which requires 6 hours of continuous standing.  Long Term Goal 7  Progress: Progressing toward goal  Problem List Patient Active Problem List   Diagnosis Date Noted  . Abnormality of gait 03/15/2013  . Right knee pain 03/15/2013  . Total knee replacement status 02/27/2013    PT - End of Session Equipment Utilized During Treatment: Gait belt Activity Tolerance: Patient limited by pain;Patient tolerated treatment well General Behavior During Therapy: [facility] assessed/performed  GP    Thu Baggett R Euleta Belson 05/24/2013, 6:40 PM

## 2013-05-31 ENCOUNTER — Ambulatory Visit (HOSPITAL_COMMUNITY)
Admission: RE | Admit: 2013-05-31 | Discharge: 2013-05-31 | Disposition: A | Discharge status: Home / Self Care | Payer: 59 | Source: Ambulatory Visit | Attending: Family Medicine | Admitting: Family Medicine

## 2013-05-31 DIAGNOSIS — M25569 Pain in unspecified knee: Secondary | ICD-10-CM | POA: Insufficient documentation

## 2013-05-31 DIAGNOSIS — I10 Essential (primary) hypertension: Secondary | ICD-10-CM | POA: Insufficient documentation

## 2013-05-31 DIAGNOSIS — M25669 Stiffness of unspecified knee, not elsewhere classified: Secondary | ICD-10-CM | POA: Insufficient documentation

## 2013-05-31 DIAGNOSIS — Z79899 Other long term (current) drug therapy: Secondary | ICD-10-CM | POA: Insufficient documentation

## 2013-05-31 DIAGNOSIS — K219 Gastro-esophageal reflux disease without esophagitis: Secondary | ICD-10-CM | POA: Insufficient documentation

## 2013-05-31 DIAGNOSIS — IMO0001 Reserved for inherently not codable concepts without codable children: Secondary | ICD-10-CM | POA: Insufficient documentation

## 2013-05-31 DIAGNOSIS — E119 Type 2 diabetes mellitus without complications: Secondary | ICD-10-CM | POA: Insufficient documentation

## 2013-05-31 DIAGNOSIS — R269 Unspecified abnormalities of gait and mobility: Secondary | ICD-10-CM

## 2013-05-31 NOTE — Progress Notes (Signed)
Physical Therapy Treatment Patient Details  Name: Connie Ross MRN: [id number] Date of Birth: September 01, 1955  Today's Date: 05/31/2013 Time: 1884-1660 PT Time Calculation (min): 45 min    Charges: TE 1730-1800, Manual 1800-1830 Visit#: 19 of 24  Re-eval: 06/06/13 Assessment Diagnosis: R TKR  Surgical Date: 02/27/13 Next MD Visit: Dr. Lajoyce Corners MD  05/13/2013 Prior Therapy: no   Authorization: [employer] -  initally required letter of med necessity   Authorization Time Period:    Authorization Visit#: 19 of     Subjective: Symptoms/Limitations Symptoms: Patient states she is feeling better today, only Lt knee pain (nonsurgical side)  Pain Assessment Currently in Pain?: Yes Pain Score: 4  Pain Location: Knee Pain Orientation: Left  Precautions/Restrictions     Exercise/Treatments Mobility/Balance  Ambulation/Gait Ambulation/Gait: Yes Ambulation/Gait Assistance: 6: Modified independent (Device/Increase time) Assistive device: Small based quad cane Gait Pattern: Within Functional Limits    Stretches Active Hamstring Stretch: 3 reps;30 seconds;Limitations Active Hamstring Stretch Limitations: standing on 8in step Quad Stretch: 5 reps;10 seconds Gastroc Stretch: 3 reps;30 seconds Gastroc Stretch Limitations: slant board Standing Forward Lunges: 10 reps (to BOSU) Forward Step Up: 10 reps;Step Height: 8";2 sets;Both (6") Step Down: 2 sets;Both;Step Height: 2";Step Height: 4";5 reps Functional Squat: 10 reps;1 set (3 way squat 10x each)  Manual Therapy Joint Mobilization: quads, hamstring, ITB, scar mobilizaion, Anterior to posterior and posterior to anterior grade 3 knee mobilizations  Physical Therapy Assessment and Plan PT Assessment and Plan Clinical Impression Statement: Patient performed all therapy with no Rt knee pain this session with only complaint of Rt knee pain. Patient's Lt knee (non-surgical side) continues to be her limiting factor due to pain secondary to  knee arthritis. Patient continued to require 1 HHA for stair ambulation. Session focused on stair descent for which patient required only one HHA when descending with Lt LE leading vs 2 HHA when leading with Rt LE. Continued focus needed on single LE strengthening.  PT Plan: Continue to progress knee strengtha nd mobillity so patient can return to work.     Goals PT Long Term Goals Long Term Goal 4 Progress: Progressing toward goal PT Long Term Goal 5: Improve R quadriceps  MMt to 4/5 for improved stability with possible upcoming left TKR - new goals set today  Long Term Goal 5 Progress: Progressing toward goal PT Long Term Goal 7: Patient will be able to stand for 60 minutes with pain <2/10 to progress to returning to work which requires 6 hours of continuous standing.  Long Term Goal 7 Progress: Progressing toward goal  Problem List Patient Active Problem List   Diagnosis Date Noted  . Abnormality of gait 03/15/2013  . Right knee pain 03/15/2013  . Total knee replacement status 02/27/2013       GP    Etheline Geppert R Georgenia Salim PT DPT 05/31/2013, 6:22 PM

## 2013-06-04 ENCOUNTER — Telehealth (HOSPITAL_COMMUNITY): Payer: Self-pay

## 2013-06-04 ENCOUNTER — Ambulatory Visit (HOSPITAL_COMMUNITY): Payer: 59 | Admitting: Physical Therapy

## 2013-06-06 ENCOUNTER — Ambulatory Visit (HOSPITAL_COMMUNITY)
Admission: RE | Admit: 2013-06-06 | Discharge: 2013-06-06 | Disposition: A | Discharge status: Home / Self Care | Payer: 59 | Source: Ambulatory Visit | Attending: Family Medicine | Admitting: Family Medicine

## 2013-06-06 DIAGNOSIS — Z79899 Other long term (current) drug therapy: Secondary | ICD-10-CM | POA: Insufficient documentation

## 2013-06-06 DIAGNOSIS — M25669 Stiffness of unspecified knee, not elsewhere classified: Secondary | ICD-10-CM | POA: Insufficient documentation

## 2013-06-06 DIAGNOSIS — K219 Gastro-esophageal reflux disease without esophagitis: Secondary | ICD-10-CM | POA: Insufficient documentation

## 2013-06-06 DIAGNOSIS — R269 Unspecified abnormalities of gait and mobility: Secondary | ICD-10-CM

## 2013-06-06 DIAGNOSIS — I10 Essential (primary) hypertension: Secondary | ICD-10-CM | POA: Insufficient documentation

## 2013-06-06 DIAGNOSIS — M25569 Pain in unspecified knee: Secondary | ICD-10-CM | POA: Insufficient documentation

## 2013-06-06 DIAGNOSIS — IMO0001 Reserved for inherently not codable concepts without codable children: Secondary | ICD-10-CM | POA: Insufficient documentation

## 2013-06-06 DIAGNOSIS — E119 Type 2 diabetes mellitus without complications: Secondary | ICD-10-CM | POA: Insufficient documentation

## 2013-06-06 NOTE — Evaluation (Addendum)
Physical Therapy Discharge  Patient Details  Name: Connie Ross MRN: [id number] Date of Birth: [birth year]  Today's Date: 06/06/2013 Time: 2841-3244 PT Time Calculation (min): 45 min     Manual therapy 1730-1800, TherEx 1800-1815         Visit#: 20 of 24  Re-eval: 06/06/13 Assessment Diagnosis: R TKR  Surgical Date: 02/27/13 Next MD Visit: Dr. Berdine Addison MD  06/11/13 Prior Therapy: no   Authorization: [employer] -  initally required letter of med necessity     Authorization Time Period:    Authorization Visit#: 20 of 24   Past Medical History:  Past Medical History  Diagnosis Date  . Hypertension   . GERD (gastroesophageal reflux disease)   . High cholesterol   . Type II diabetes mellitus   . Rheumatoid arthritis    Past Surgical History:  Past Surgical History  Procedure Laterality Date  . Total knee arthroplasty Right 02/28/2013  . Total knee arthroplasty Right 02/27/2013    Procedure: TOTAL KNEE ARTHROPLASTY- knee;  Surgeon: Newt Minion, MD;  Location: [facility];  Service: Orthopedics;  Laterality: Right;  Right Total Knee Arthroplasty    Subjective Symptoms/Limitations Symptoms: patient states increased pain this week in Lt LE. "thi has been Ross bad week." Pain Assessment Currently in Pain?: Yes Pain Score: 6  Pain Orientation: Left Pain Type: Chronic pain Pain Frequency: Intermittent  Prior Functioning  Prior Function Vocation: Full time employment Vocation Requirements: standing/ wallking   Assessment RLE AROM (degrees) Right Knee Extension: 0 Right Knee Flexion: 120 RLE Strength Right Hip Flexion: 5/5 Right Hip ABduction: 3+/5 Right Knee Flexion:  (4+/5) Right Knee Extension: 5/5 Right Ankle Dorsiflexion:  (4+/5) LLE AROM (degrees) Left Knee Extension: -10 (pain at end range) Left Knee Flexion: 100 (pain at end range) LLE Strength Left Hip Flexion: 5/5 Left Hip ABduction: 3+/5 Left Knee Flexion: 3+/5 Left Knee Extension: 4/5 Left Ankle Dorsiflexion:  4/5  Exercise/Treatments Mobility/Balance  Ambulation/Gait Ambulation/Gait: Yes Ambulation/Gait Assistance: 6: Modified independent (Device/Increase time) Assistive device: Small based quad cane Gait Pattern: Within Functional Limits  Aerobic Stationary Bike: Nu step hill level 5 x 70min Standing Forward Lunges: 4 sets;10 reps Forward Lunges Limitations: with functional manaul reaction techniques to improve sequencing.   Manual Therapy Myofascial Release: Calf, ITB, Hamstring, Quad  Physical Therapy Assessment and Plan PT Assessment and Plan Clinical Impression Statement: Patient has met all but two long term goals as patient continues to be limited in her ability to perform prolonged standing and stair ambulation without handrail assist due to Lt knee pain. Patient's Rt knee has felt much better and is not limiting her from performing any of her normal activities of daily living or activities required for work. What is limiting her is her Lt knee pain despite best attempts to permanently decrease pain via skilled physical therapy. Patient's progress of Rt knee functional mobility and strength has plateaued secondary to Lt knee pain that limits the patient from performing more advanced weight bearing exercises. This physical therapist believes the patient would benefit from Ross Lt TKA though I am not sure if she is medically ready to have such Ross procedure due to her recent history of blood clots.  PT Treatment/Interventions: Financial trader;Therapeutic activities;Therapeutic exercise;Manual techniques;Neuromuscular re-education;Balance training;Modalities PT Plan: Patient discharged with home exercise plan for contineud strengtheing, progression of mobility and pain relief.     Goals PT Short Term Goals PT Short Term Goal 1: imrpove right knee AROM 0- 115 for sitting comfort, gait, stair  use  PT Short Term Goal 1 - Progress: Met PT Short Term Goal 2: patient improve right quad  strength to 3+/5 for stability  PT Short Term Goal 2 - Progress: Met PT Short Term Goal 3: indepenent bed mobility sit to supine for independence at home  PT Short Term Goal 3 - Progress: Met PT Long Term Goals PT Long Term Goal 1: patient able to walk with straight cane 10 - 15 min for household and start of community ambulation  PT Long Term Goal 1 - Progress: Met PT Long Term Goal 2: Patient able to stand in kitchen 10 + minutes for meal prep  PT Long Term Goal 2 - Progress: Met Long Term Goal 3: patient able to perform light household acitivities of cooking and cleaning  Long Term Goal 3 Progress: Met Long Term Goal 4: patient able to ascend 5 stairs comfortably with min use of handrial  Long Term Goal 4 Progress: Not met PT Long Term Goal 5: Improve R quadriceps  MMt to 4/5 for improved stability with possible upcoming left TKR - new goals set today  Long Term Goal 5 Progress: Met PT Long Term Goal 6: Patient will be able to perform 5x sit to stand in 15second with no hands- MET 12" Long Term Goal 6 Progress: Met PT Long Term Goal 7: Patient will be able to stand for 60 minutes with pain <2/10 to progress to returning to work which requires 6 hours of continuous standing.  Long Term Goal 7 Progress: Not met  Problem List Patient Active Problem List   Diagnosis Date Noted  . Abnormality of gait 03/15/2013  . Right knee pain 03/15/2013  . Total knee replacement status 02/27/2013    PT - End of Session Equipment Utilized During Treatment: Gait belt Activity Tolerance: Patient limited by pain;Patient tolerated treatment well General Behavior During Therapy: WFL for tasks assessed/performed  GP   Functional Assessment Tool Used: FOTO mobility 55, 45%  Functional Limitation: Mobility: Walking and moving around Mobility: Walking and Moving Around Current Status ([id number]): At least 40 percent but less than 60 percent impaired, limited or restricted Mobility: Walking and Moving Around  Goal Status [phone]): At least 40 percent but less than 60 percent impaired, limited or restricted Mobility: Walking and Moving Around Discharge Status [phone]): At least 40 percent but less than 60 percent impaired, limited or restricted  Connie Alf 06/06/2013, 6:34 PM  Physician Documentation Your signature is required to indicate approval of the treatment plan as stated above.  Please sign and either send electronically or make Ross copy of this report for your files and return this physician signed original.   Please mark one 1.__approve of plan  2. ___approve of plan with the following conditions.   ______________________________                                                          _____________________ Physician Signature  Date  

## 2013-09-02 ENCOUNTER — Other Ambulatory Visit (HOSPITAL_COMMUNITY): Payer: Self-pay | Admitting: Family Medicine

## 2013-09-02 DIAGNOSIS — Z86718 Personal history of other venous thrombosis and embolism: Secondary | ICD-10-CM

## 2013-09-06 ENCOUNTER — Ambulatory Visit (HOSPITAL_COMMUNITY)
Admission: RE | Admit: 2013-09-06 | Discharge: 2013-09-06 | Disposition: A | Discharge status: Home / Self Care | Payer: BC Managed Care – PPO | Source: Ambulatory Visit | Attending: Family Medicine | Admitting: Family Medicine

## 2013-09-06 DIAGNOSIS — Z96659 Presence of unspecified artificial knee joint: Secondary | ICD-10-CM | POA: Insufficient documentation

## 2013-09-06 DIAGNOSIS — Z86718 Personal history of other venous thrombosis and embolism: Secondary | ICD-10-CM | POA: Diagnosis not present

## 2013-09-06 DIAGNOSIS — M79609 Pain in unspecified limb: Secondary | ICD-10-CM | POA: Diagnosis present

## 2013-09-17 ENCOUNTER — Other Ambulatory Visit (HOSPITAL_COMMUNITY): Payer: Self-pay | Admitting: *Deleted

## 2013-09-17 ENCOUNTER — Ambulatory Visit (HOSPITAL_COMMUNITY)
Admission: RE | Admit: 2013-09-17 | Discharge: 2013-09-17 | Disposition: A | Discharge status: Home / Self Care | Payer: Disability Insurance | Source: Ambulatory Visit | Attending: Family Medicine | Admitting: Family Medicine

## 2013-09-17 DIAGNOSIS — M25519 Pain in unspecified shoulder: Secondary | ICD-10-CM | POA: Diagnosis present

## 2013-09-17 DIAGNOSIS — M069 Rheumatoid arthritis, unspecified: Secondary | ICD-10-CM

## 2013-09-17 DIAGNOSIS — M2560 Stiffness of unspecified joint, not elsewhere classified: Secondary | ICD-10-CM

## 2014-06-19 ENCOUNTER — Other Ambulatory Visit (HOSPITAL_COMMUNITY): Payer: Self-pay | Admitting: Orthopedic Surgery

## 2014-06-23 NOTE — Pre-Procedure Instructions (Signed)
Connie Ross  06/23/2014      [facility] - [place], [place] - [address] [facility] [address] [place] [address] Phone: [phone] Fax: [phone]    Your procedure is scheduled on: July 1st, Friday   Report to [facility] Admitting at  10:15 A.M.   Call this number if you have problems the morning of surgery:  [phone]   Remember:  Do not eat food or drink liquids after midnight Thursday.  Take these medicines the morning of surgery with A SIP OF WATER : Nexium, Oxycodone.             DO NOT TAKE ANY DIABETES MEDICATION THE MORNING OF SURGERY.   Do not wear jewelry, make-up or nail polish.  Do not wear lotions, powders, or perfumes.  You may NOT wear deodorant the morning of surgery.  Do not shave 48 hours prior to surgery.     Do not bring valuables to the hospital.  [facility] is not responsible for any belongings or valuables.  Contacts, dentures or bridgework may not be worn into surgery.  Leave your suitcase in the car.  After surgery it may be brought to your room. For patients admitted to the hospital, discharge time will be determined by your treatment team.  Name and phone number of your driver:     Special instructions:  "Preparing for Surgery" instruction sheet.  Please read over the following fact sheets that you were given. Pain Booklet, MRSA Information and Surgical Site Infection Prevention

## 2014-06-24 ENCOUNTER — Encounter (HOSPITAL_COMMUNITY): Payer: Self-pay

## 2014-06-24 ENCOUNTER — Encounter (HOSPITAL_COMMUNITY)
Admission: RE | Admit: 2014-06-24 | Discharge: 2014-06-24 | Disposition: A | Discharge status: Home / Self Care | Payer: BLUE CROSS/BLUE SHIELD | Source: Ambulatory Visit | Attending: Orthopedic Surgery | Admitting: Orthopedic Surgery

## 2014-06-24 DIAGNOSIS — M179 Osteoarthritis of knee, unspecified: Secondary | ICD-10-CM | POA: Diagnosis not present

## 2014-06-24 DIAGNOSIS — E119 Type 2 diabetes mellitus without complications: Secondary | ICD-10-CM | POA: Insufficient documentation

## 2014-06-24 DIAGNOSIS — Z01812 Encounter for preprocedural laboratory examination: Secondary | ICD-10-CM | POA: Diagnosis not present

## 2014-06-24 DIAGNOSIS — Z01810 Encounter for preprocedural cardiovascular examination: Secondary | ICD-10-CM | POA: Diagnosis not present

## 2014-06-24 HISTORY — DX: Anxiety disorder, unspecified: F41.9

## 2014-06-24 LAB — CBC
HCT: 36.5 % (ref 36.0–46.0)
Hemoglobin: 11.8 g/dL — ABNORMAL LOW (ref 12.0–15.0)
MCH: 30.5 pg (ref 26.0–34.0)
MCHC: 32.3 g/dL (ref 30.0–36.0)
MCV: 94.3 fL (ref 78.0–100.0)
Platelets: 248 10*3/uL (ref 150–400)
RBC: 3.87 MIL/uL (ref 3.87–5.11)
RDW: 14.1 % (ref 11.5–15.5)
WBC: 6.8 10*3/uL (ref 4.0–10.5)

## 2014-06-24 LAB — BASIC METABOLIC PANEL
Anion gap: 6 (ref 5–15)
BUN: 17 mg/dL (ref 6–20)
CO2: 27 mmol/L (ref 22–32)
Calcium: 8.9 mg/dL (ref 8.9–10.3)
Chloride: 104 mmol/L (ref 101–111)
Creatinine, Ser: 0.72 mg/dL (ref 0.44–1.00)
GFR calc Af Amer: >60 mL/min (ref >60)
GFR calc non Af Amer: >60 mL/min (ref >60)
Glucose, Bld: 147 mg/dL — ABNORMAL HIGH (ref 65–99)
Potassium: 4.2 mmol/L (ref 3.5–5.1)
Sodium: 137 mmol/L (ref 135–145)

## 2014-06-24 LAB — SURGICAL PCR SCREEN
MRSA, PCR: NEGATIVE
Staphylococcus aureus: NEGATIVE

## 2014-06-24 LAB — GLUCOSE, CAPILLARY: Glucose-Capillary: 147 mg/dL — ABNORMAL HIGH (ref 65–99)

## 2014-06-24 NOTE — Progress Notes (Signed)
PCP is Dr. Mirna Mires.  LOV 02/2014 Rheumatologist is Dr. Corliss Skains -- Has been told to stop her plaquenil. Denies any cardiac issues.   DA

## 2014-06-24 NOTE — Progress Notes (Signed)
   06/24/14 0826  OBSTRUCTIVE SLEEP APNEA  Have you ever been diagnosed with sleep apnea through a sleep study? No  Do you snore loudly (loud enough to be heard through closed doors)?  1  Do you often feel tired, fatigued, or sleepy during the daytime? 1 (mainly because of the knee)  Has anyone observed you stop breathing during your sleep? 0  Do you have, or are you being treated for high blood pressure? 1  BMI more than 35 kg/m2? 1  Age over 59 years old? 1  Neck circumference greater than 40 cm/16 inches? 1  Gender: 0

## 2014-06-25 ENCOUNTER — Other Ambulatory Visit (HOSPITAL_COMMUNITY): Payer: Self-pay | Admitting: Orthopedic Surgery

## 2014-06-26 ENCOUNTER — Other Ambulatory Visit (HOSPITAL_COMMUNITY): Payer: Self-pay | Admitting: Orthopedic Surgery

## 2014-07-03 MED ORDER — CEFAZOLIN SODIUM-DEXTROSE 2-3 GM-% IV SOLR
2.0000 g | INTRAVENOUS | Status: AC
  Administered 2014-07-04: 2 g via INTRAVENOUS
  Filled 2014-07-03: qty 50

## 2014-07-04 ENCOUNTER — Inpatient Hospital Stay (HOSPITAL_COMMUNITY)
Admission: RE | Admit: 2014-07-04 | Discharge: 2014-07-06 | DRG: 470 | Disposition: A | Discharge status: Home Health | Payer: BLUE CROSS/BLUE SHIELD | Source: Ambulatory Visit | Attending: Orthopedic Surgery | Admitting: Orthopedic Surgery

## 2014-07-04 ENCOUNTER — Encounter (HOSPITAL_COMMUNITY)
Admission: RE | Disposition: A | Discharge status: Home Health | Payer: Self-pay | Source: Ambulatory Visit | Attending: Orthopedic Surgery

## 2014-07-04 ENCOUNTER — Inpatient Hospital Stay (HOSPITAL_COMMUNITY): Payer: BLUE CROSS/BLUE SHIELD | Admitting: Anesthesiology

## 2014-07-04 ENCOUNTER — Encounter (HOSPITAL_COMMUNITY): Payer: Self-pay | Admitting: *Deleted

## 2014-07-04 DIAGNOSIS — M069 Rheumatoid arthritis, unspecified: Secondary | ICD-10-CM | POA: Diagnosis present

## 2014-07-04 DIAGNOSIS — Z86718 Personal history of other venous thrombosis and embolism: Secondary | ICD-10-CM | POA: Diagnosis not present

## 2014-07-04 DIAGNOSIS — I10 Essential (primary) hypertension: Secondary | ICD-10-CM | POA: Diagnosis present

## 2014-07-04 DIAGNOSIS — Z96651 Presence of right artificial knee joint: Secondary | ICD-10-CM | POA: Diagnosis present

## 2014-07-04 DIAGNOSIS — Z7982 Long term (current) use of aspirin: Secondary | ICD-10-CM | POA: Diagnosis not present

## 2014-07-04 DIAGNOSIS — D62 Acute posthemorrhagic anemia: Secondary | ICD-10-CM | POA: Diagnosis not present

## 2014-07-04 DIAGNOSIS — Z79899 Other long term (current) drug therapy: Secondary | ICD-10-CM

## 2014-07-04 DIAGNOSIS — Z7901 Long term (current) use of anticoagulants: Secondary | ICD-10-CM

## 2014-07-04 DIAGNOSIS — K219 Gastro-esophageal reflux disease without esophagitis: Secondary | ICD-10-CM | POA: Diagnosis present

## 2014-07-04 DIAGNOSIS — Z7952 Long term (current) use of systemic steroids: Secondary | ICD-10-CM | POA: Diagnosis not present

## 2014-07-04 DIAGNOSIS — M1712 Unilateral primary osteoarthritis, left knee: Principal | ICD-10-CM | POA: Diagnosis present

## 2014-07-04 DIAGNOSIS — E780 Pure hypercholesterolemia: Secondary | ICD-10-CM | POA: Diagnosis present

## 2014-07-04 DIAGNOSIS — E119 Type 2 diabetes mellitus without complications: Secondary | ICD-10-CM | POA: Diagnosis present

## 2014-07-04 DIAGNOSIS — Z96653 Presence of artificial knee joint, bilateral: Secondary | ICD-10-CM

## 2014-07-04 DIAGNOSIS — M25562 Pain in left knee: Secondary | ICD-10-CM | POA: Diagnosis present

## 2014-07-04 HISTORY — PX: TOTAL KNEE ARTHROPLASTY: SHX125

## 2014-07-04 HISTORY — DX: Acute embolism and thrombosis of unspecified deep veins of unspecified lower extremity: I82.409

## 2014-07-04 LAB — GLUCOSE, CAPILLARY
Glucose-Capillary: 127 mg/dL — ABNORMAL HIGH (ref 65–99)
Glucose-Capillary: 101 mg/dL — ABNORMAL HIGH (ref 65–99)

## 2014-07-04 SURGERY — ARTHROPLASTY, KNEE, TOTAL
Anesthesia: Monitor Anesthesia Care | Site: Knee | Laterality: Left

## 2014-07-04 MED ORDER — MENTHOL 3 MG MT LOZG: 1.0000 | LOZENGE | OROMUCOSAL | Status: DC | PRN

## 2014-07-04 MED ORDER — TRANEXAMIC ACID 1000 MG/10ML IV SOLN
2000.0000 mg | INTRAVENOUS | Status: AC
  Administered 2014-07-04: 2000 mg via TOPICAL
  Filled 2014-07-04: qty 20

## 2014-07-04 MED ORDER — OXYCODONE HCL 5 MG PO TABS: 5.0000 mg | ORAL_TABLET | Freq: Once | ORAL | Status: DC | PRN

## 2014-07-04 MED ORDER — LOSARTAN POTASSIUM 50 MG PO TABS
50.0000 mg | ORAL_TABLET | Freq: Every day | ORAL | Status: DC
  Administered 2014-07-04 – 2014-07-06 (×3): 50 mg via ORAL
  Filled 2014-07-04 (×3): qty 1

## 2014-07-04 MED ORDER — MIDAZOLAM HCL 2 MG/2ML IJ SOLN
INTRAMUSCULAR | Status: AC
  Filled 2014-07-04: qty 2

## 2014-07-04 MED ORDER — MIDAZOLAM HCL 2 MG/2ML IJ SOLN
INTRAMUSCULAR | Status: AC
  Administered 2014-07-04: 1 mg
  Filled 2014-07-04: qty 2

## 2014-07-04 MED ORDER — PHENYLEPHRINE HCL 10 MG/ML IJ SOLN
INTRAMUSCULAR | Status: DC | PRN
  Administered 2014-07-04: 20 ug via INTRAVENOUS
  Administered 2014-07-04 (×2): 40 ug via INTRAVENOUS
  Administered 2014-07-04: 20 ug via INTRAVENOUS

## 2014-07-04 MED ORDER — PROPOFOL INFUSION 10 MG/ML OPTIME
INTRAVENOUS | Status: DC | PRN
  Administered 2014-07-04: 25 ug/kg/min via INTRAVENOUS

## 2014-07-04 MED ORDER — ONDANSETRON HCL 4 MG/2ML IJ SOLN: 4.0000 mg | Freq: Four times a day (QID) | INTRAMUSCULAR | Status: DC | PRN

## 2014-07-04 MED ORDER — MIDAZOLAM HCL 2 MG/2ML IJ SOLN
INTRAMUSCULAR | Status: DC | PRN
  Administered 2014-07-04 (×2): 1 mg via INTRAVENOUS

## 2014-07-04 MED ORDER — ONDANSETRON HCL 4 MG PO TABS: 4.0000 mg | ORAL_TABLET | Freq: Four times a day (QID) | ORAL | Status: DC | PRN

## 2014-07-04 MED ORDER — ONDANSETRON HCL 4 MG/2ML IJ SOLN: 4.0000 mg | Freq: Once | INTRAMUSCULAR | Status: DC | PRN

## 2014-07-04 MED ORDER — FENTANYL CITRATE (PF) 100 MCG/2ML IJ SOLN
50.0000 ug | Freq: Once | INTRAMUSCULAR | Status: AC
  Administered 2014-07-04: 50 ug via INTRAVENOUS

## 2014-07-04 MED ORDER — BISACODYL 5 MG PO TBEC: 5.0000 mg | DELAYED_RELEASE_TABLET | Freq: Every day | ORAL | Status: DC | PRN

## 2014-07-04 MED ORDER — HYDROMORPHONE HCL 1 MG/ML IJ SOLN
1.0000 mg | INTRAMUSCULAR | Status: DC | PRN
  Administered 2014-07-04 – 2014-07-06 (×13): 1 mg via INTRAVENOUS
  Filled 2014-07-04 (×13): qty 1

## 2014-07-04 MED ORDER — PREDNISONE 5 MG PO TABS
5.0000 mg | ORAL_TABLET | Freq: Every day | ORAL | Status: DC
  Administered 2014-07-05 – 2014-07-06 (×2): 5 mg via ORAL
  Filled 2014-07-04 (×2): qty 1

## 2014-07-04 MED ORDER — FENTANYL CITRATE (PF) 250 MCG/5ML IJ SOLN
INTRAMUSCULAR | Status: AC
  Filled 2014-07-04: qty 5

## 2014-07-04 MED ORDER — DEXTROSE 5 % IV SOLN
10.0000 mg | INTRAVENOUS | Status: DC | PRN
  Administered 2014-07-04: 10 ug/min via INTRAVENOUS

## 2014-07-04 MED ORDER — MIDAZOLAM HCL 2 MG/2ML IJ SOLN: 1.0000 mg | Freq: Once | INTRAMUSCULAR | Status: AC

## 2014-07-04 MED ORDER — PROPOFOL 10 MG/ML IV BOLUS
INTRAVENOUS | Status: AC
  Filled 2014-07-04: qty 20

## 2014-07-04 MED ORDER — FENTANYL CITRATE (PF) 100 MCG/2ML IJ SOLN
INTRAMUSCULAR | Status: DC
Start: 2014-07-04 — End: 2014-07-04
  Filled 2014-07-04: qty 2

## 2014-07-04 MED ORDER — OXYCODONE HCL 5 MG/5ML PO SOLN: 5.0000 mg | Freq: Once | ORAL | Status: DC | PRN

## 2014-07-04 MED ORDER — FENTANYL CITRATE (PF) 100 MCG/2ML IJ SOLN
INTRAMUSCULAR | Status: AC
  Administered 2014-07-04: 50 ug via INTRAVENOUS
  Filled 2014-07-04: qty 2

## 2014-07-04 MED ORDER — LACTATED RINGERS IV SOLN
Freq: Once | INTRAVENOUS | Status: AC
  Administered 2014-07-04 (×2): via INTRAVENOUS

## 2014-07-04 MED ORDER — ACETAMINOPHEN 325 MG PO TABS
650.0000 mg | ORAL_TABLET | Freq: Four times a day (QID) | ORAL | Status: DC | PRN
  Administered 2014-07-05 (×2): 650 mg via ORAL
  Filled 2014-07-04 (×2): qty 2

## 2014-07-04 MED ORDER — MAGNESIUM CITRATE PO SOLN: 1.0000 | Freq: Once | ORAL | Status: AC | PRN

## 2014-07-04 MED ORDER — FENTANYL CITRATE (PF) 250 MCG/5ML IJ SOLN
INTRAMUSCULAR | Status: DC | PRN
  Administered 2014-07-04: 25 ug via INTRAVENOUS

## 2014-07-04 MED ORDER — METFORMIN HCL 500 MG PO TABS
1000.0000 mg | ORAL_TABLET | Freq: Three times a day (TID) | ORAL | Status: DC
  Administered 2014-07-05 – 2014-07-06 (×5): 1000 mg via ORAL
  Filled 2014-07-04 (×5): qty 2

## 2014-07-04 MED ORDER — LOSARTAN POTASSIUM-HCTZ 50-12.5 MG PO TABS: 1.0000 | ORAL_TABLET | Freq: Every day | ORAL | Status: DC

## 2014-07-04 MED ORDER — SODIUM CHLORIDE 0.9 % IV SOLN: INTRAVENOUS | Status: DC

## 2014-07-04 MED ORDER — SODIUM CHLORIDE 0.9 % IR SOLN
Status: DC | PRN
  Administered 2014-07-04: 1000 mL
  Administered 2014-07-04: 3000 mL

## 2014-07-04 MED ORDER — POLYETHYLENE GLYCOL 3350 17 G PO PACK: 17.0000 g | PACK | Freq: Every day | ORAL | Status: DC | PRN

## 2014-07-04 MED ORDER — OXYCODONE HCL 5 MG PO TABS
5.0000 mg | ORAL_TABLET | ORAL | Status: DC | PRN
  Administered 2014-07-04 – 2014-07-06 (×2): 10 mg via ORAL
  Filled 2014-07-04 (×2): qty 2

## 2014-07-04 MED ORDER — HYDROMORPHONE HCL 1 MG/ML IJ SOLN: 0.2500 mg | INTRAMUSCULAR | Status: DC | PRN

## 2014-07-04 MED ORDER — DOCUSATE SODIUM 100 MG PO CAPS
100.0000 mg | ORAL_CAPSULE | Freq: Two times a day (BID) | ORAL | Status: DC
Start: 2014-07-04 — End: 2014-07-06
  Administered 2014-07-04 – 2014-07-06 (×4): 100 mg via ORAL
  Filled 2014-07-04 (×4): qty 1

## 2014-07-04 MED ORDER — SAXAGLIPTIN-METFORMIN ER 2.5-1000 MG PO TB24: 1.0000 | ORAL_TABLET | Freq: Three times a day (TID) | ORAL | Status: DC

## 2014-07-04 MED ORDER — HYDROCHLOROTHIAZIDE 12.5 MG PO CAPS
12.5000 mg | ORAL_CAPSULE | Freq: Every day | ORAL | Status: DC
  Administered 2014-07-04 – 2014-07-06 (×3): 12.5 mg via ORAL
  Filled 2014-07-04 (×3): qty 1

## 2014-07-04 MED ORDER — BUPIVACAINE LIPOSOME 1.3 % IJ SUSP
20.0000 mL | INTRAMUSCULAR | Status: AC
  Administered 2014-07-04: 20 mL
  Filled 2014-07-04: qty 20

## 2014-07-04 MED ORDER — METOCLOPRAMIDE HCL 5 MG PO TABS: 5.0000 mg | ORAL_TABLET | Freq: Three times a day (TID) | ORAL | Status: DC | PRN

## 2014-07-04 MED ORDER — LINAGLIPTIN 5 MG PO TABS
5.0000 mg | ORAL_TABLET | Freq: Three times a day (TID) | ORAL | Status: DC
  Administered 2014-07-05 – 2014-07-06 (×5): 5 mg via ORAL
  Filled 2014-07-04 (×5): qty 1

## 2014-07-04 MED ORDER — METOCLOPRAMIDE HCL 5 MG/ML IJ SOLN: 5.0000 mg | Freq: Three times a day (TID) | INTRAMUSCULAR | Status: DC | PRN

## 2014-07-04 MED ORDER — ASPIRIN EC 325 MG PO TBEC
325.0000 mg | DELAYED_RELEASE_TABLET | Freq: Every day | ORAL | Status: DC
  Administered 2014-07-05: 325 mg via ORAL
  Filled 2014-07-04: qty 1

## 2014-07-04 MED ORDER — CEFAZOLIN SODIUM 1-5 GM-% IV SOLN
1.0000 g | Freq: Four times a day (QID) | INTRAVENOUS | Status: AC
  Administered 2014-07-04 – 2014-07-05 (×2): 1 g via INTRAVENOUS
  Filled 2014-07-04 (×3): qty 50

## 2014-07-04 MED ORDER — PHENOL 1.4 % MT LIQD: 1.0000 | OROMUCOSAL | Status: DC | PRN

## 2014-07-04 MED ORDER — ACETAMINOPHEN 650 MG RE SUPP: 650.0000 mg | Freq: Four times a day (QID) | RECTAL | Status: DC | PRN

## 2014-07-04 SURGICAL SUPPLY — 51 items
BLADE SAG 18X100X1.27 (BLADE) ×2 IMPLANT
BLADE SAGITTAL 25.0X1.27X90 (BLADE) ×2 IMPLANT
BLADE SURG 21 STRL SS (BLADE) ×4 IMPLANT
BNDG COHESIVE 6X5 TAN STRL LF (GAUZE/BANDAGES/DRESSINGS) ×2 IMPLANT
BNDG GAUZE ELAST 4 BULKY (GAUZE/BANDAGES/DRESSINGS) ×2 IMPLANT
BONE CEMENT PALACOSE (Orthopedic Implant) ×4 IMPLANT
BOWL SMART MIX CTS (DISPOSABLE) ×2 IMPLANT
CAPT KNEE TOTAL 3 ATTUNE ×2 IMPLANT
CEMENT BONE PALACOSE (Orthopedic Implant) ×2 IMPLANT
COVER SURGICAL LIGHT HANDLE (MISCELLANEOUS) ×4 IMPLANT
CUFF TOURNIQUET SINGLE 34IN LL (TOURNIQUET CUFF) IMPLANT
CUFF TOURNIQUET SINGLE 44IN (TOURNIQUET CUFF) IMPLANT
DRAPE EXTREMITY T 121X128X90 (DRAPE) ×2 IMPLANT
DRAPE IMP U-DRAPE 54X76 (DRAPES) ×2 IMPLANT
DRAPE PROXIMA HALF (DRAPES) ×2 IMPLANT
DRAPE U-SHAPE 47X51 STRL (DRAPES) ×2 IMPLANT
DRSG ADAPTIC 3X8 NADH LF (GAUZE/BANDAGES/DRESSINGS) ×2 IMPLANT
DRSG PAD ABDOMINAL 8X10 ST (GAUZE/BANDAGES/DRESSINGS) ×2 IMPLANT
DURAPREP 26ML APPLICATOR (WOUND CARE) ×2 IMPLANT
ELECT PENCIL ROCKER SW 15FT (MISCELLANEOUS) ×2 IMPLANT
ELECT REM PT RETURN 9FT ADLT (ELECTROSURGICAL) ×2
ELECTRODE REM PT RTRN 9FT ADLT (ELECTROSURGICAL) ×1 IMPLANT
FACESHIELD WRAPAROUND (MASK) ×2 IMPLANT
GAUZE SPONGE 4X4 12PLY STRL (GAUZE/BANDAGES/DRESSINGS) ×2 IMPLANT
GLOVE BIOGEL PI IND STRL 9 (GLOVE) ×1 IMPLANT
GLOVE BIOGEL PI INDICATOR 9 (GLOVE) ×1
GLOVE SURG ORTHO 9.0 STRL STRW (GLOVE) ×2 IMPLANT
GOWN STRL REUS W/ TWL XL LVL3 (GOWN DISPOSABLE) ×3 IMPLANT
GOWN STRL REUS W/TWL XL LVL3 (GOWN DISPOSABLE) ×3
HANDPIECE INTERPULSE COAX TIP (DISPOSABLE) ×1
KIT BASIN OR (CUSTOM PROCEDURE TRAY) ×2 IMPLANT
KIT ROOM TURNOVER OR (KITS) ×2 IMPLANT
MANIFOLD NEPTUNE II (INSTRUMENTS) ×2 IMPLANT
NEEDLE SPNL 18GX3.5 QUINCKE PK (NEEDLE) ×2 IMPLANT
NS IRRIG 1000ML POUR BTL (IV SOLUTION) ×2 IMPLANT
PACK TOTAL JOINT (CUSTOM PROCEDURE TRAY) ×2 IMPLANT
PACK UNIVERSAL I (CUSTOM PROCEDURE TRAY) ×2 IMPLANT
PAD ARMBOARD 7.5X6 YLW CONV (MISCELLANEOUS) ×4 IMPLANT
PADDING CAST COTTON 6X4 STRL (CAST SUPPLIES) ×2 IMPLANT
SET HNDPC FAN SPRY TIP SCT (DISPOSABLE) ×1 IMPLANT
STAPLER VISISTAT 35W (STAPLE) ×2 IMPLANT
SUCTION FRAZIER TIP 10 FR DISP (SUCTIONS) IMPLANT
SUT VIC AB 0 CTB1 27 (SUTURE) IMPLANT
SUT VIC AB 1 CTX 36 (SUTURE)
SUT VIC AB 1 CTX36XBRD ANBCTR (SUTURE) IMPLANT
SYR 50ML LL SCALE MARK (SYRINGE) ×2 IMPLANT
TOWEL OR 17X24 6PK STRL BLUE (TOWEL DISPOSABLE) ×2 IMPLANT
TOWEL OR 17X26 10 PK STRL BLUE (TOWEL DISPOSABLE) ×2 IMPLANT
TRAY FOLEY CATH 16FRSI W/METER (SET/KITS/TRAYS/PACK) IMPLANT
WATER STERILE IRR 1000ML POUR (IV SOLUTION) ×4 IMPLANT
WRAP KNEE MAXI GEL POST OP (GAUZE/BANDAGES/DRESSINGS) ×2 IMPLANT

## 2014-07-04 NOTE — Anesthesia Postprocedure Evaluation (Signed)
  Anesthesia Post-op Note  Patient: Connie Ross  Procedure(s) Performed: Procedure(s): LEFT TOTAL KNEE ARTHROPLASTY (Left)  Patient Location: PACU  Anesthesia Type:MAC and Spinal  Level of Consciousness: awake, alert  and oriented  Airway and Oxygen Therapy: Patient Spontanous Breathing and Patient connected to nasal cannula oxygen  Post-op Pain: mild  Post-op Assessment: Post-op Vital signs reviewed, Patient's Cardiovascular Status Stable, Respiratory Function Stable, Patent Airway and Pain level controlled LLE Motor Response: Purposeful movement LLE Sensation: Full sensation (denies numnbess/tingling, albe to wiggle toes)     L Sensory Level: S5-Perianal area R Sensory Level: S5-Perianal area  Post-op Vital Signs: stable  Last Vitals:  Filed Vitals:   07/04/14 1628  BP: 116/63  Pulse: 74  Temp: 37 C  Resp: 16    Complications: No apparent anesthesia complications

## 2014-07-04 NOTE — Op Note (Signed)
07/04/2014  2:18 PM  PATIENT:  Connie Ross    PRE-OPERATIVE DIAGNOSIS:  Osteoarthritis Left Knee  POST-OPERATIVE DIAGNOSIS:  Same  PROCEDURE:  LEFT TOTAL KNEE ARTHROPLASTY  SURGEON:  Murray Guzzetta V, MD  PHYSICIAN ASSISTANT:None ANESTHESIA:   General  PREOPERATIVE INDICATIONS:  Connie Ross is a  59 y.o. female with a diagnosis of Osteoarthritis Left Knee who failed conservative measures and elected for surgical management.    The risks benefits and alternatives were discussed with the patient preoperatively including but not limited to the risks of infection, bleeding, nerve injury, cardiopulmonary complications, the need for revision surgery, among others, and the patient was willing to proceed.  OPERATIVE IMPLANTS: [place] implants size 4 femur, size 4 tibia, 14 mm polyethylene tray, 32 mm patella.  OPERATIVE FINDINGS: TXA topically and Exparil injected into the popliteal fossa  OPERATIVE PROCEDURE: Patient was brought to the operating room and underwent a spinal and aesthetic after a femoral block. After adequate levels of anesthesia obtained patient's left lower extremity was prepped using DuraPrep draped into a sterile field. A timeout was called. Connie Ross was used to cover all exposed skin. A midline incision was made carried down to a medial parapatellar retinacular incision. Intramedullary guide was used for the femur 5 valgus 9 mm taken off the femur. Attention was then focused on the tibia. 10 mm was taken off the tibia neutral varus and valgus 3 posterior slope. The femur was then sized for a size 4 and the chamfer and box cuts were made for the size 4 femur. The tibial tray and the femoral trial were placed the knee was placed with range of motion 14 mm polyethylene tray was stable the tibia was marked and the tibial keel punch was made. The patella was resurfaced and 10 mm was taken off the patella and the patellar lug cuts were made for the size 32 patella. The  popliteal fossa was injected and care was taken not to have an anterior vascular injection with the X Burrell. The TXA was topically soaked on the knee. The knee was irrigated with pulsatile lavage. The femoral and tibial components were cemented in place and excess cement was removed. The tibial tray was placed and the knee was left in extension until the cement hardened. The patella was clamped and loose cement was removed. The knee was placed through a full range of motion after cemented hardened and after further irrigation with pulsatile lavage. The knee was stable in extension and flexion the patella tracked midline. The retinaculum was closed using #1 Vicryls subcutaneous is closed using 0 Vicryls skin was closed using staples. A sterile compressive dressing was applied. Patient was taken to the PACU in stable condition.

## 2014-07-04 NOTE — Anesthesia Procedure Notes (Signed)
Spinal Patient location during procedure: ICU Start time: 07/04/2014 12:47 PM End time: 07/04/2014 12:50 PM Staffing Performed by: anesthesiologist  Preanesthetic Checklist Completed: patient identified, site marked, surgical consent, pre-op evaluation, timeout performed, IV checked, risks and benefits discussed and monitors and equipment checked Spinal Block Patient position: left lateral decubitus Prep: ChloraPrep Patient monitoring: cardiac monitor, heart rate and blood pressure Approach: left paramedian Location: L4-5 Injection technique: single-shot Needle Needle gauge: 22 G Needle length: 9 cm Assessment Sensory level: T10 Additional Notes 10 mg 0.5% bupivacaine with 1:200 epi injected easily

## 2014-07-04 NOTE — Transfer of Care (Signed)
Immediate Anesthesia Transfer of Care Note  Patient: Connie Ross  Procedure(s) Performed: Procedure(s): LEFT TOTAL KNEE ARTHROPLASTY (Left)  Patient Location: PACU  Anesthesia Type:Spinal  Level of Consciousness: awake and alert   Airway & Oxygen Therapy: Patient Spontanous Breathing and Patient connected to nasal cannula oxygen  Post-op Assessment: Report given to RN and Post -op Vital signs reviewed and stable  Post vital signs: Reviewed and stable  Last Vitals:  Filed Vitals:   07/04/14 1440  BP: 89/50  Pulse: 75  Temp: 36.9 C  Resp: 17    Complications: No apparent anesthesia complications

## 2014-07-04 NOTE — Anesthesia Preprocedure Evaluation (Signed)
Anesthesia Evaluation  Patient identified by MRN, date of birth, ID band Patient awake    Reviewed: Allergy & Precautions, NPO status , Patient's Chart, lab work & pertinent test results, Unable to perform ROS - Chart review only  Airway Mallampati: II  TM Distance: >3 FB Neck ROM: Full    Dental  (+) Edentulous Upper, Dental Advisory Given   Pulmonary  breath sounds clear to auscultation        Cardiovascular hypertension, Rhythm:Regular Rate:Normal     Neuro/Psych    GI/Hepatic   Endo/Other  diabetes  Renal/GU      Musculoskeletal   Abdominal   Peds  Hematology   Anesthesia Other Findings   Reproductive/Obstetrics                             Anesthesia Physical Anesthesia Plan  ASA: III  Anesthesia Plan: MAC and Spinal   Post-op Pain Management:    Induction: Intravenous  Airway Management Planned:   Additional Equipment:   Intra-op Plan:   Post-operative Plan:   Informed Consent:   Dental advisory given  Plan Discussed with: CRNA and Anesthesiologist  Anesthesia Plan Comments:         Anesthesia Quick Evaluation

## 2014-07-04 NOTE — H&P (Signed)
TOTAL KNEE ADMISSION H&P  Patient is being admitted for left total knee arthroplasty.  Subjective:  Chief Complaint:left knee pain.  HPI: Connie Ross, 59 y.o. female, has a history of pain and functional disability in the left knee due to arthritis and has failed non-surgical conservative treatments for greater than 12 weeks to includeNSAID's and/or analgesics, corticosteriod injections, use of assistive devices and activity modification.  Onset of symptoms was gradual, starting 8 years ago with gradually worsening course since that time. The patient noted no past surgery on the left knee(s).  Patient currently rates pain in the left knee(s) at 8 out of 10 with activity. Patient has night pain, worsening of pain with activity and weight bearing, pain that interferes with activities of daily living, pain with passive range of motion, crepitus and joint swelling.  Patient has evidence of subchondral cysts, subchondral sclerosis, periarticular osteophytes and joint space narrowing by imaging studies. This patient has had avascular necrosis of the knee. There is no active infection.  Patient Active Problem List   Diagnosis Date Noted  . Abnormality of gait 03/15/2013  . Right knee pain 03/15/2013  . Total knee replacement status 02/27/2013   Past Medical History  Diagnosis Date  . Hypertension   . GERD (gastroesophageal reflux disease)   . High cholesterol   . Type II diabetes mellitus   . Rheumatoid arthritis   . Anxiety     takes no meds    Past Surgical History  Procedure Laterality Date  . Total knee arthroplasty Right 02/28/2013  . Total knee arthroplasty Right 02/27/2013    Procedure: TOTAL KNEE ARTHROPLASTY- knee;  Surgeon: Nadara Mustard, MD;  Location: [facility];  Service: Orthopedics;  Laterality: Right;  Right Total Knee Arthroplasty  . Eye surgery      bilateral cataracts    No prescriptions prior to admission   No Known Allergies  History  Substance Use Topics  .  Smoking status: Never Smoker   . Smokeless tobacco: Never Used  . Alcohol Use: No    No family history on file.   Review of Systems  All other systems reviewed and are negative.   Objective:  Physical Exam  Vital signs in last 24 hours:    Labs:   Estimated body mass index is 42.22 kg/(m^2) as calculated from the following:   Height as of 02/22/13: 5' (1.524 m).   Weight as of 02/22/13: 98.068 kg (216 lb 3.2 oz).   Imaging Review Plain radiographs demonstrate moderate degenerative joint disease of the left knee(s). The overall alignment ismild varus. The bone quality appears to be adequate for age and reported activity level.  Assessment/Plan:  End stage arthritis, left knee   The patient history, physical examination, clinical judgment of the provider and imaging studies are consistent with end stage degenerative joint disease of the left knee(s) and total knee arthroplasty is deemed medically necessary. The treatment options including medical management, injection therapy arthroscopy and arthroplasty were discussed at length. The risks and benefits of total knee arthroplasty were presented and reviewed. The risks due to aseptic loosening, infection, stiffness, patella tracking problems, thromboembolic complications and other imponderables were discussed. The patient acknowledged the explanation, agreed to proceed with the plan and consent was signed. Patient is being admitted for inpatient treatment for surgery, pain control, PT, OT, prophylactic antibiotics, VTE prophylaxis, progressive ambulation and ADL's and discharge planning. The patient is planning to be discharged home with home health services

## 2014-07-05 MED ORDER — RIVAROXABAN 10 MG PO TABS
10.0000 mg | ORAL_TABLET | Freq: Every day | ORAL | Status: DC
  Administered 2014-07-05 – 2014-07-06 (×2): 10 mg via ORAL
  Filled 2014-07-05 (×2): qty 1

## 2014-07-05 NOTE — Progress Notes (Signed)
Occupational Therapy Evaluation Patient Details Name: Connie Ross MRN: [id number] DOB: 06/02/1955 Today's Date: 07/05/2014    History of Present Illness Pt is a 59 y/o F s/p L TKA.  Pt's PMH includes R TKA, HTN, DMII, RA, anxiety.   Clinical Impression   Patient presenting with decreased overall independence secondary to above. Patient independent PTA. Patient currently functioning at an overall min assist level. Patient will benefit from acute OT to increase overall independence in the areas of ADLs, functional mobility, and overall safety in order to safely discharge home with assistance from family.     Follow Up Recommendations  No OT follow up;Supervision/Assistance - 24 hour    Equipment Recommendations  None recommended by OT    Recommendations for Other Services  None at this time  Precautions / Restrictions Precautions Precautions: Fall;Knee Precaution Comments: Reviewed no pillow under knee Restrictions Weight Bearing Restrictions: Yes LLE Weight Bearing: Weight bearing as tolerated      Mobility - Per PT evaluation Bed Mobility Overal bed mobility: Needs Assistance Bed Mobility: Supine to Sit     Supine to sit: Min assist     General bed mobility comments: Min assist managing LLE, pt w/ use of bed rails and required HOB to be elevated to be able to push up to sitting.    Transfers Overall transfer level: Needs assistance Equipment used: Rolling walker (2 wheeled) Transfers: Sit to/from Stand Sit to Stand: Min assist General transfer comment: Min assist to power up to standing.  VCs for hand placement and to lean forward during sit>stand.    Balance - Per PT evaluation Overall balance assessment: Needs assistance Sitting-balance support: Bilateral upper extremity supported;Feet supported Sitting balance-Leahy Scale: Fair     Standing balance support: Bilateral upper extremity supported;During functional activity Standing balance-Leahy Scale:  Poor    ADL Overall ADL's : Needs assistance/impaired General ADL Comments: Pt unable to reach BLEs for LB ADLs. Pt required min assist for functional mobility and transfers due to increased pain and weakness in LLE.      Vision Additional Comments: No change from baseline          Pertinent Vitals/Pain Pain Assessment: 0-10 Pain Score: 10-Worst pain ever Faces Pain Scale: Hurts whole lot Pain Location: left knee Pain Descriptors / Indicators: Grimacing;Aching;Moaning Pain Intervention(s): Limited activity within patient's tolerance;Monitored during session;Repositioned     Hand Dominance Right   Extremity/Trunk Assessment Upper Extremity Assessment Upper Extremity Assessment: Overall WFL for tasks assessed   Lower Extremity Assessment Lower Extremity Assessment: Defer to PT evaluation LLE Deficits / Details: weakness and limited ROM as expected s/p L TKA   Cervical / Trunk Assessment Cervical / Trunk Assessment: Normal   Communication Communication Communication: Prefers language other than English (spanish, daughters available to translate)   Cognition Arousal/Alertness: Awake/alert Behavior During Therapy: WFL for tasks assessed/performed Overall Cognitive Status: Within Functional Limits for tasks assessed             Home Living Family/patient expects to be discharged to:: Private residence Living Arrangements: Spouse/significant other;Children;Other relatives (daughter and grandson) Available Help at Discharge: Family;Available 24 hours/day (husband, daughter, grandson) Type of Home: House Home Access: Stairs to enter [employer] of Steps: 4 Entrance Stairs-Rails: Can reach both Home Layout: One level     Bathroom Shower/Tub: Walk-in shower;Door   [employer] Toilet: Standard     Home Equipment: Environmental consultant - 2 wheels;Cane - single point;Bedside commode;Grab bars - tub/shower   Prior Functioning/Environment Level of Independence: Independent with  assistive device(s)  Comments: Pt used cane short short distances and RW for long distance walking per pt's daugther    OT Diagnosis: Generalized weakness;Acute pain   OT Problem List: Decreased strength;Decreased range of motion;Decreased activity tolerance;Impaired balance (sitting and/or standing);Decreased safety awareness;Decreased knowledge of use of DME or AE;Decreased knowledge of precautions;Pain   OT Treatment/Interventions: Self-care/ADL training;Therapeutic exercise;Energy conservation;DME and/or AE instruction;Therapeutic activities;Patient/family education;Balance training    OT Goals(Current goals can be found in the care plan section) Acute Rehab OT Goals Patient Stated Goal: to go home OT Goal Formulation: With patient/family Time For Goal Achievement: 07/12/14 Potential to Achieve Goals: Good ADL Goals Pt Will Perform Lower Body Bathing: with supervision;sit to/from stand Pt Will Perform Lower Body Dressing: with supervision;sit to/from stand Pt Will Transfer to Toilet: with supervision;ambulating;bedside commode Pt Will Perform Tub/Shower Transfer: Shower transfer;rolling walker;ambulating;3 in 1;with supervision Additional ADL Goal #1: Pt will indepednently verbalize and adhere to knee precautions 100% of the time  OT Frequency: Min 2X/week   Barriers to D/C: None known at this time   End of Session Equipment Utilized During Treatment: Rolling walker Nurse Communication: Patient requests pain meds  Activity Tolerance: Patient tolerated treatment well Patient left: in chair;with call bell/phone within reach;with family/visitor present   Time: 4825-0037 OT Time Calculation (min): 21 min Charges:  OT General Charges $OT Visit: 1 Procedure OT Evaluation $Initial OT Evaluation Tier I: 1 Procedure  Ida Milbrath , MS, OTR/L, CLT Pager: [phone]  07/05/2014, 11:47 AM

## 2014-07-05 NOTE — Progress Notes (Signed)
UR Completed. Karlon Schlafer, RN, BSN.  [phone] 

## 2014-07-05 NOTE — Discharge Instructions (Addendum)
INSTRUCTIONS AFTER JOINT REPLACEMENT  ° °o Remove items at home which could result in a fall. This includes throw rugs or furniture in walking pathways °o ICE to the affected joint every three hours while awake for 30 minutes at a time, for at least the first 3-5 days, and then as needed for pain and swelling.  Continue to use ice for pain and swelling. You may notice swelling that will progress down to the foot and ankle.  This is normal after surgery.  Elevate your leg when you are not up walking on it.   °o Continue to use the breathing machine you got in the hospital (incentive spirometer) which will help keep your temperature down.  It is common for your temperature to cycle up and down following surgery, especially at night when you are not up moving around and exerting yourself.  The breathing machine keeps your lungs expanded and your temperature down. ° ° °DIET:  As you were doing prior to hospitalization, we recommend a well-balanced diet. ° °DRESSING / WOUND CARE / SHOWERING ° °Keep the surgical dressing until follow up.  The dressing is water proof, so you can shower without any extra covering.  IF THE DRESSING FALLS OFF or the wound gets wet inside, change the dressing with sterile gauze.  Please use good hand washing techniques before changing the dressing.  Do not use any lotions or creams on the incision until instructed by your surgeon.   ° °ACTIVITY ° °o Increase activity slowly as tolerated, but follow the weight bearing instructions below.   °o No driving for 6 weeks or until further direction given by your physician.  You cannot drive while taking narcotics.  °o No lifting or carrying greater than 10 lbs. until further directed by your surgeon. °o Avoid periods of inactivity such as sitting longer than an hour when not asleep. This helps prevent blood clots.  °o You may return to work once you are authorized by your doctor.  ° ° ° °WEIGHT BEARING  ° °Weight bearing as tolerated with assist  device (walker, cane, etc) as directed, use it as long as suggested by your surgeon or therapist, typically at least 4-6 weeks. ° ° °EXERCISES ° °Results after joint replacement surgery are often greatly improved when you follow the exercise, range of motion and muscle strengthening exercises prescribed by your doctor. Safety measures are also important to protect the joint from further injury. Any time any of these exercises cause you to have increased pain or swelling, decrease what you are doing until you are comfortable again and then slowly increase them. If you have problems or questions, call your caregiver or physical therapist for advice.  ° °Rehabilitation is important following a joint replacement. After just a few days of immobilization, the muscles of the leg can become weakened and shrink (atrophy).  These exercises are designed to build up the tone and strength of the thigh and leg muscles and to improve motion. Often times heat used for twenty to thirty minutes before working out will loosen up your tissues and help with improving the range of motion but do not use heat for the first two weeks following surgery (sometimes heat can increase post-operative swelling).  ° °These exercises can be done on a training (exercise) mat, on the floor, on a table or on a bed. Use whatever works the best and is most comfortable for you.    Use music or television while you are exercising so that   the exercises are a pleasant break in your day. This will make your life better with the exercises acting as a break in your routine that you can look forward to.   Perform all exercises about fifteen times, three times per day or as directed.  You should exercise both the operative leg and the other leg as well. ° °Exercises include: °  °• Quad Sets - Tighten up the muscle on the front of the thigh (Quad) and hold for 5-10 seconds.   °• Straight Leg Raises - With your knee straight (if you were given a brace, keep it on),  lift the leg to 60 degrees, hold for 3 seconds, and slowly lower the leg.  Perform this exercise against resistance later as your leg gets stronger.  °• Leg Slides: Lying on your back, slowly slide your foot toward your buttocks, bending your knee up off the floor (only go as far as is comfortable). Then slowly slide your foot back down until your leg is flat on the floor again.  °• Angel Wings: Lying on your back spread your legs to the side as far apart as you can without causing discomfort.  °• Hamstring Strength:  Lying on your back, push your heel against the floor with your leg straight by tightening up the muscles of your buttocks.  Repeat, but this time bend your knee to a comfortable angle, and push your heel against the floor.  You may put a pillow under the heel to make it more comfortable if necessary.  ° °A rehabilitation program following joint replacement surgery can speed recovery and prevent re-injury in the future due to weakened muscles. Contact your doctor or a physical therapist for more information on knee rehabilitation.  ° ° °CONSTIPATION ° °Constipation is defined medically as fewer than three stools per week and severe constipation as less than one stool per week.  Even if you have a regular bowel pattern at home, your normal regimen is likely to be disrupted due to multiple reasons following surgery.  Combination of anesthesia, postoperative narcotics, change in appetite and fluid intake all can affect your bowels.  ° °YOU MUST use at least one of the following options; they are listed in order of increasing strength to get the job done.  They are all available over the counter, and you may need to use some, POSSIBLY even all of these options:   ° °Drink plenty of fluids (prune juice may be helpful) and high fiber foods °Colace 100 mg by mouth twice a day  °Senokot for constipation as directed and as needed Dulcolax (bisacodyl), take with full glass of water  °Miralax (polyethylene glycol)  once or twice a day as needed. ° °If you have tried all these things and are unable to have a bowel movement in the first 3-4 days after surgery call either your surgeon or your primary doctor.   ° °If you experience loose stools or diarrhea, hold the medications until you stool forms back up.  If your symptoms do not get better within 1 week or if they get worse, check with your doctor.  If you experience "the worst abdominal pain ever" or develop nausea or vomiting, please contact the office immediately for further recommendations for treatment. ° ° °ITCHING:  If you experience itching with your medications, try taking only a single pain pill, or even half a pain pill at a time.  You can also use Benadryl over the counter for itching or also to   help with sleep.   TED HOSE STOCKINGS:  Use stockings on both legs until for at least 2 weeks or as directed by physician office. They may be removed at night for sleeping.  MEDICATIONS:  See your medication summary on the After Visit Summary that nursing will review with you.  You may have some home medications which will be placed on hold until you complete the course of blood thinner medication.  It is important for you to complete the blood thinner medication as prescribed.  PRECAUTIONS:  If you experience chest pain or shortness of breath - call 911 immediately for transfer to the hospital emergency department.   If you develop a fever greater that 101 F, purulent drainage from wound, increased redness or drainage from wound, foul odor from the wound/dressing, or calf pain - CONTACT YOUR SURGEON.                                                   FOLLOW-UP APPOINTMENTS:  If you do not already have a post-op appointment, please call the office for an appointment to be seen by your surgeon.  Guidelines for how soon to be seen are listed in your After Visit Summary, but are typically between 1-4 weeks after surgery.  OTHER INSTRUCTIONS:   Knee  Replacement:  Do not place pillow under knee, focus on keeping the knee straight while resting. CPM instructions: 0-90 degrees, 2 hours in the morning, 2 hours in the afternoon, and 2 hours in the evening. Place foam block, curve side up under heel at all times except when in CPM or when walking.  DO NOT modify, tear, cut, or change the foam block in any way.  MAKE SURE YOU:   Understand these instructions.   Get help right away if you are not doing well or get worse.    Thank you for letting us be a part of your medical care team.  It is a privilege we respect greatly.  We hope these instructions will help you stay on track for a fast and full recovery!     Information on my medicine - XARELTO (Rivaroxaban)  This medication education was reviewed with me or my healthcare representative as part of my discharge preparation.    Why was Xarelto prescribed for you? Xarelto was prescribed for you to reduce the risk of blood clots forming after orthopedic surgery. The medical term for these abnormal blood clots is venous thromboembolism (VTE).  What do you need to know about xarelto ? Take your Xarelto 10mg  ONCE DAILY at the same time every day. You may take it either with or without food.  If you have difficulty swallowing the tablet whole, you may crush it and mix in applesauce just prior to taking your dose.  Take Xarelto exactly as prescribed by your doctor and DO NOT stop taking Xarelto without talking to the doctor who prescribed the medication.  Stopping without other VTE prevention medication to take the place of Xarelto may increase your risk of developing a clot.  After discharge, you should have regular check-up appointments with your healthcare provider that is prescribing your Xarelto.    What do you do if you miss a dose? If you miss a dose, take it as soon as you remember on the same day then continue your regularly scheduled once daily  regimen the next day. Do not  take two doses of Xarelto® on the same day.  ° °Important Safety Information °A possible side effect of Xarelto® is bleeding. You should call your healthcare provider right away if you experience any of the following: °? Bleeding from an injury or your nose that does not stop. °? Unusual colored urine (red or dark brown) or unusual colored stools (red or black). °? Unusual bruising for unknown reasons. °? A serious fall or if you hit your head (even if there is no bleeding). ° °Some medicines may interact with Xarelto® and might increase your risk of bleeding while on Xarelto®. To help avoid this, consult your healthcare provider or pharmacist prior to using any new prescription or non-prescription medications, including herbals, vitamins, non-steroidal anti-inflammatory drugs (NSAIDs) and supplements. ° °This website has more information on Xarelto®: www.xarelto.com. ° ° ° ° °

## 2014-07-05 NOTE — Evaluation (Signed)
Physical Therapy Evaluation Patient Details Name: Connie Ross MRN: [id number] DOB: 12-Aug-1955 Today's Date: 07/05/2014   History of Present Illness  Pt is a 59 y/o F s/p L TKA.  Pt's PMH includes R TKA, HTN, DMII, RA, anxiety.  Clinical Impression  Pt is s/p L TKA resulting in the deficits listed below (see PT Problem List). Pt limited by pain and weakness this session.  Discussed w/ pt that she will need to complete stair training before d/c or she might be more appropriate for d/c to SNF if mobility does not improve, pt verbalized understanding.   Ambulated 10 ft in room but required assist w/ advancing LLE as pt was shuffling/dragging it.  Pt will benefit from skilled PT to increase their independence and safety with mobility to allow discharge to the venue listed below.     Follow Up Recommendations Home health PT;Supervision for mobility/OOB;SNF (SNF if pt's mobility does not improve)    Equipment Recommendations  Rolling walker with 5" wheels (Pt requests new RW as old RW is "wobbly")    Recommendations for Other Services       Precautions / Restrictions Precautions Precautions: Fall;Knee Precaution Comments: Reviewed no pillow under knee Restrictions Weight Bearing Restrictions: Yes LLE Weight Bearing: Weight bearing as tolerated      Mobility  Bed Mobility Overal bed mobility: Needs Assistance Bed Mobility: Supine to Sit     Supine to sit: Min assist     General bed mobility comments: Min assist managing LLE, pt w/ use of bed rails and required HOB to be elevated to be able to push up to sitting.    Transfers Overall transfer level: Needs assistance Equipment used: Rolling walker (2 wheeled) Transfers: Sit to/from Stand Sit to Stand: Min assist         General transfer comment: Min assist to power up to standing.  VCs for hand placement and to lean forward during sit>stand.  Ambulation/Gait Ambulation/Gait assistance: Min assist Ambulation  Distance (Feet): 10 Feet Assistive device: Rolling walker (2 wheeled) Gait Pattern/deviations: Step-to pattern;Decreased stride length;Decreased stance time - left;Decreased weight shift to left;Shuffle;Antalgic;Trunk flexed   Gait velocity interpretation: Below normal speed for age/gender General Gait Details: Pt shuffles/drags LLE behind and reports she is unable to lift it off floor.  Initially pt able to lift LLE off floor x3 while ambulating but then cannot 2/2 pain and limited strength.  Pt WB heavily through BUEs on RW.  Min assist to advance LLE.  Stairs            Wheelchair Mobility    Modified Rankin (Stroke Patients Only)       Balance Overall balance assessment: Needs assistance Sitting-balance support: Bilateral upper extremity supported;Feet supported Sitting balance-Leahy Scale: Fair     Standing balance support: Bilateral upper extremity supported;During functional activity Standing balance-Leahy Scale: Poor                               Pertinent Vitals/Pain Pain Assessment: Faces Faces Pain Scale: Hurts whole lot Pain Location: L knee Pain Descriptors / Indicators: Guarding;Grimacing;Moaning Pain Intervention(s): Limited activity within patient's tolerance;Monitored during session;Repositioned    Home Living Family/patient expects to be discharged to:: Private residence Living Arrangements: Spouse/significant other;Children;Other relatives (daughter and grandson) Available Help at Discharge: Family;Available 24 hours/day (husband, daughter, grandson) Type of Home: House Home Access: Stairs to enter Entrance Stairs-Rails: Can reach both Entrance Stairs-Number of Steps: 4 Home Layout: One  level Home Equipment: Walker - 2 wheels;Cane - single point;Bedside commode (Walker is "wobbly" per pt's daughter)      Prior Function Level of Independence: Independent with assistive device(s)         Comments: Pt used cane short short distances  and RW for long distance walking per pt's daugther     Hand Dominance        Extremity/Trunk Assessment               Lower Extremity Assessment: LLE deficits/detail   LLE Deficits / Details: weakness and limited ROM as expected s/p L TKA     Communication   Communication: Prefers language other than English ([place].  Daughter and granddaughter translated)  Cognition Arousal/Alertness: Awake/alert Behavior During Therapy: WFL for tasks assessed/performed Overall Cognitive Status: Within Functional Limits for tasks assessed                      General Comments General comments (skin integrity, edema, etc.): Pt limited by pain and weakness this session.  Discussed w/ pt that she will need to complete stair training before d/c or she might be more appropriate for d/c to SNF if mobility does not improve, pt verbalized understanding.    Exercises Total Joint Exercises Ankle Circles/Pumps: AROM;Both;15 reps;Supine Quad Sets: AROM;Both;10 reps;Supine Heel Slides: AAROM;Left;5 reps;Seated Knee Flexion: AROM;AAROM;Left;5 reps;Seated Goniometric ROM: 10-99      Assessment/Plan    PT Assessment Patient needs continued PT services  PT Diagnosis Difficulty walking;Abnormality of gait;Generalized weakness;Acute pain   PT Problem List Decreased strength;Decreased range of motion;Decreased activity tolerance;Decreased balance;Decreased mobility;Decreased coordination;Decreased knowledge of use of DME;Decreased safety awareness;Decreased knowledge of precautions;Decreased skin integrity;Pain  PT Treatment Interventions DME instruction;Gait training;Stair training;Functional mobility training;Therapeutic activities;Therapeutic exercise;Balance training;Neuromuscular re-education;Patient/family education;Modalities   PT Goals (Current goals can be found in the Care Plan section) Acute Rehab PT Goals Patient Stated Goal: to go home PT Goal Formulation: With  patient/family Time For Goal Achievement: 07/12/14 Potential to Achieve Goals: Good    Frequency 7X/week   Barriers to discharge Inaccessible home environment 4 steps to enter home    Co-evaluation               End of Session Equipment Utilized During Treatment: Gait belt Activity Tolerance: Patient limited by pain Patient left: in chair;with call bell/phone within reach;with family/visitor present Nurse Communication: Mobility status;Precautions;Weight bearing status         Time: 0076-2263 PT Time Calculation (min) (ACUTE ONLY): 27 min   Charges:   PT Evaluation $Initial PT Evaluation Tier I: 1 Procedure PT Treatments $Gait Training: 8-22 mins   PT G CodesMichail Jewels PT, DPT [phone] Pager: [phone] 07/05/2014, 11:01 AM

## 2014-07-05 NOTE — Progress Notes (Signed)
   Subjective:  Patient reports pain as severe.  No events.  Objective:   VITALS:   Filed Vitals:   07/04/14 1628 07/04/14 2025 07/05/14 0036 07/05/14 0510  BP: 116/63 114/60 124/68 121/57  Pulse: 74 86 88 77  Temp: 98.6 F (37 C) 99.7 F (37.6 C) 100.4 F (38 C) 98.1 F (36.7 C)  TempSrc: Oral Oral Oral Oral  Resp: 16 14 15 16   Height:      Weight:      SpO2: 98% 93% 92% 93%    Neurologically intact Neurovascular intact Sensation intact distally Intact pulses distally Dorsiflexion/Plantar flexion intact Incision: dressing C/D/I and no drainage No cellulitis present Compartment soft   Lab Results  Component Value Date   WBC 6.8 06/24/2014   HGB 11.8* 06/24/2014   HCT 36.5 06/24/2014   MCV 94.3 06/24/2014   PLT 248 06/24/2014     Assessment/Plan:  1 Day Post-Op   - Expected postop acute blood loss anemia - will monitor for symptoms - Up with PT/OT - DVT ppx - SCDs, ambulation, xarelto - patient had DVT s/p R TKA - WBAT left lower extremity - Pain control - Discharge planning  06/26/2014 07/05/2014, 10:02 AM [phone]

## 2014-07-05 NOTE — Progress Notes (Signed)
Orthopedic Tech Progress Note Patient Details:  Connie Ross [birth year] [id number]  Ortho Devices Type of Ortho Device: Knee Immobilizer Ortho Device/Splint Location: LLE Ortho Device/Splint Interventions: Application   Asia R Thompson 07/05/2014, 1:23 PM

## 2014-07-06 LAB — CBC
HCT: 30.3 % — ABNORMAL LOW (ref 36.0–46.0)
Hemoglobin: 9.5 g/dL — ABNORMAL LOW (ref 12.0–15.0)
MCH: 29.8 pg (ref 26.0–34.0)
MCHC: 31.4 g/dL (ref 30.0–36.0)
MCV: 95 fL (ref 78.0–100.0)
Platelets: 249 10*3/uL (ref 150–400)
RBC: 3.19 MIL/uL — ABNORMAL LOW (ref 3.87–5.11)
RDW: 14 % (ref 11.5–15.5)
WBC: 9.9 10*3/uL (ref 4.0–10.5)

## 2014-07-06 LAB — BASIC METABOLIC PANEL
Anion gap: 9 (ref 5–15)
BUN: 11 mg/dL (ref 6–20)
CO2: 30 mmol/L (ref 22–32)
Calcium: 8.7 mg/dL — ABNORMAL LOW (ref 8.9–10.3)
Chloride: 96 mmol/L — ABNORMAL LOW (ref 101–111)
Creatinine, Ser: 0.78 mg/dL (ref 0.44–1.00)
GFR calc Af Amer: >60 mL/min (ref >60)
GFR calc non Af Amer: >60 mL/min (ref >60)
Glucose, Bld: 178 mg/dL — ABNORMAL HIGH (ref 65–99)
Potassium: 4 mmol/L (ref 3.5–5.1)
Sodium: 135 mmol/L (ref 135–145)

## 2014-07-06 MED ORDER — OXYCODONE HCL 5 MG PO TABS: 5.0000 mg | ORAL_TABLET | ORAL | Status: DC | PRN

## 2014-07-06 MED ORDER — SENNOSIDES-DOCUSATE SODIUM 8.6-50 MG PO TABS: 1.0000 | ORAL_TABLET | Freq: Every evening | ORAL | Status: DC | PRN

## 2014-07-06 MED ORDER — RIVAROXABAN 10 MG PO TABS: 10.0000 mg | ORAL_TABLET | Freq: Every day | ORAL | Status: DC

## 2014-07-06 MED ORDER — METHOCARBAMOL 750 MG PO TABS: 750.0000 mg | ORAL_TABLET | Freq: Two times a day (BID) | ORAL | Status: DC | PRN

## 2014-07-06 NOTE — Discharge Summary (Signed)
Physician Discharge Summary      Patient ID: Connie Ross MRN: [id number] DOB/AGE: 06/06/1955 59 y.o.  Admit date: 07/04/2014 Discharge date: 07/06/2014  Admission Diagnoses:  <principal problem not specified>  Discharge Diagnoses:  Active Problems:   Total knee replacement status   Past Medical History  Diagnosis Date  . Hypertension   . GERD (gastroesophageal reflux disease)   . High cholesterol   . Type II diabetes mellitus   . Rheumatoid arthritis   . Anxiety     takes no meds  . DVT (deep venous thrombosis)     Surgeries: Procedure(s): LEFT TOTAL KNEE ARTHROPLASTY on 07/04/2014   Consultants (if any):    Discharged Condition: Improved  Hospital Course: Connie Ross is an 59 y.o. female who was admitted 07/04/2014 with a diagnosis of <principal problem not specified> and went to the operating room on 07/04/2014 and underwent the above named procedures.    She was given perioperative antibiotics:  Anti-infectives    Start     Dose/Rate Route Frequency Ordered Stop   07/04/14 1800  ceFAZolin (ANCEF) IVPB 1 g/50 mL premix     1 g 100 mL/hr over 30 Minutes Intravenous Every 6 hours 07/04/14 1703 07/05/14 0249   07/04/14 1145  ceFAZolin (ANCEF) IVPB 2 g/50 mL premix     2 g 100 mL/hr over 30 Minutes Intravenous To [facility]Stay [facility] 07/03/14 1251 07/04/14 1252    .  She was given sequential compression devices, early ambulation, and xarelto for DVT prophylaxis.  She benefited maximally from the hospital stay and there were no complications.    Recent vital signs:  Filed Vitals:   07/06/14 0514  BP: 94/48  Pulse: 93  Temp: 97.7 F (36.5 C)  Resp: 17    Recent laboratory studies:  Lab Results  Component Value Date   HGB 9.5* 07/06/2014   HGB 11.8* 06/24/2014   HGB 11.5* 02/22/2013   Lab Results  Component Value Date   WBC 9.9 07/06/2014   PLT 249 07/06/2014   Lab Results  Component Value Date   INR 0.99 02/22/2013   Lab Results    Component Value Date   NA 135 07/06/2014   K 4.0 07/06/2014   CL 96* 07/06/2014   CO2 30 07/06/2014   BUN 11 07/06/2014   CREATININE 0.78 07/06/2014   GLUCOSE 178* 07/06/2014    Discharge Medications:     Medication List    TAKE these medications        acetaminophen 500 MG tablet  Commonly known as:  TYLENOL  Take 500 mg by mouth every 6 (six) hours as needed for mild pain.     aspirin 81 MG tablet  Take 81 mg by mouth daily.     fexofenadine 180 MG tablet  Commonly known as:  ALLEGRA  Take 180 mg by mouth daily.     folic acid 1 MG tablet  Commonly known as:  FOLVITE  Take 1 mg by mouth daily.     HUMIRA PEN 40 MG/0.8ML Pnkt  Generic drug:  Adalimumab  Inject 40 mg into the muscle every 14 (fourteen) days.     HYDROcodone-acetaminophen 5-325 MG per tablet  Commonly known as:  NORCO/VICODIN  Take 1 tablet by mouth every 6 (six) hours as needed for severe pain.     KOMBIGLYZE XR 2.05-998 MG Tb24  Generic drug:  Saxagliptin-Metformin  Take 1 tablet by mouth every 8 (eight) hours.     losartan-hydrochlorothiazide 50-12.5 MG per tablet  Commonly known as:  HYZAAR  Take 1 tablet by mouth daily.     methocarbamol 750 MG tablet  Commonly known as:  ROBAXIN  Take 1 tablet (750 mg total) by mouth 2 (two) times daily as needed for muscle spasms.     niacin 500 MG CR tablet  Commonly known as:  NIASPAN  Take 500 mg by mouth at bedtime.     oxyCODONE 5 MG immediate release tablet  Commonly known as:  Oxy IR/ROXICODONE  Take 1-3 tablets (5-15 mg total) by mouth every 4 (four) hours as needed.     predniSONE 5 MG tablet  Commonly known as:  DELTASONE  Take 5 mg by mouth daily with breakfast.     RASUVO 20 MG/0.4ML Soaj  Generic drug:  Methotrexate (PF)  Inject 20 mg into the muscle once a week. Saturday     rivaroxaban 10 MG Tabs tablet  Commonly known as:  XARELTO  Take 1 tablet (10 mg total) by mouth daily.     senna-docusate 8.6-50 MG per tablet   Commonly known as:  SENOKOT S  Take 1 tablet by mouth at bedtime as needed.        Diagnostic Studies: No results found.  Disposition: 01-Home or Self Care      Discharge Instructions    Call MD / Call 911    Complete by:  As directed   If you experience chest pain or shortness of breath, CALL 911 and be transported to the hospital emergency room.  If you develope a fever above 101.5 F, pus (white drainage) or increased drainage or redness at the wound, or calf pain, call your surgeon's office.     Constipation Prevention    Complete by:  As directed   Drink plenty of fluids.  Prune juice may be helpful.  You may use a stool softener, such as Colace (over the counter) 100 mg twice a day.  Use MiraLax (over the counter) for constipation as needed.     Diet - low sodium heart healthy    Complete by:  As directed      Diet general    Complete by:  As directed      Driving restrictions    Complete by:  As directed   No driving while taking narcotic pain meds.     Increase activity slowly as tolerated    Complete by:  As directed            Follow-up Information    Follow up with DUDA,MARCUS V, MD In 2 weeks.   Specialty:  Orthopedic Surgery   Why:  For wound re-check, For suture removal   Contact information:   [address] [place] [address] [phone]        Signed: Cheral Almas 07/06/2014, 7:51 PM

## 2014-07-06 NOTE — Progress Notes (Signed)
   Subjective:  Patient reports pain as controlled by pain meds.  Objective:   VITALS:   Filed Vitals:   07/05/14 0036 07/05/14 0510 07/05/14 2312 07/06/14 0514  BP: 124/68 121/57 120/60 94/48  Pulse: 88 77 115 93  Temp: 100.4 F (38 C) 98.1 F (36.7 C) 100 F (37.8 C) 97.7 F (36.5 C)  TempSrc: Oral Oral Oral Oral  Resp: 15 16 17 17   Height:      Weight:      SpO2: 92% 93% 95% 93%    Neurologically intact Neurovascular intact Sensation intact distally Intact pulses distally Dorsiflexion/Plantar flexion intact Incision: dressing C/D/I and no drainage No cellulitis present Compartment soft   Lab Results  Component Value Date   WBC 6.8 06/24/2014   HGB 11.8* 06/24/2014   HCT 36.5 06/24/2014   MCV 94.3 06/24/2014   PLT 248 06/24/2014     Assessment/Plan:  2 Days Post-Op   - Expected postop acute blood loss anemia - will monitor for symptoms - CBC, BMP ordered for today - Up with PT/OT - DVT ppx - SCDs, ambulation, xarelto - patient had DVT s/p R TKA - WBAT left lower extremity - Pain control - Discharge planning - home today after PT - Rx in chart  06/26/2014 07/06/2014, 10:38 AM [phone]

## 2014-07-06 NOTE — Progress Notes (Signed)
Physical Therapy Treatment Patient Details Name: Connie Ross MRN: [id number] DOB: January 04, 1956 Today's Date: 07/06/2014    History of Present Illness Pt is a 59 y/o F s/p L TKA.  Pt's PMH includes R TKA, HTN, DMII, RA, anxiety.    PT Comments    Pt will benefit from at least one more therapy session prior to d/c to improve gait mechanics and safety prior to returning home.  Pt continued to shuffle/toe curl LLE behind as she reports it is too painful to bend her L knee.  Min assist provided to posterior knee and foot to achieve L knee and hip flexion during ambulation.  Pt unable to demonstrate ability to bend L knee to bring L foot off floor during ambulation but rather performs L hip hike causing her L hip to become sore.    Follow Up Recommendations  Home health PT;Supervision for mobility/OOB;SNF     Equipment Recommendations  Rolling walker with 5" wheels    Recommendations for Other Services       Precautions / Restrictions Precautions Precautions: Fall;Knee Precaution Comments: Reviewed no pillow under knee Restrictions Weight Bearing Restrictions: Yes LLE Weight Bearing: Weight bearing as tolerated    Mobility  Bed Mobility Overal bed mobility: Needs Assistance Bed Mobility: Supine to Sit     Supine to sit: Min assist     General bed mobility comments: Min assist w/ managing LLE to EOB. Pt required increased time and did not have bed rails and HOB flat.  Transfers Overall transfer level: Needs assistance Equipment used: Rolling walker (2 wheeled) Transfers: Sit to/from Stand Sit to Stand: Min guard         General transfer comment: Min guard for safety.  Cues for hand placement as pt has both on RW.  Increased time and R weight shift to offload LLE.  Ambulation/Gait Ambulation/Gait assistance: Min assist Ambulation Distance (Feet): 50 Feet Assistive device: Rolling walker (2 wheeled) Gait Pattern/deviations: Step-to pattern;Shuffle;Decreased  weight shift to left;Decreased stance time - left;Decreased stride length;Antalgic;Trunk flexed   Gait velocity interpretation: Below normal speed for age/gender General Gait Details: Pt continued to shuffle/toe curl LLE behind as she reports it is too painful to bend her L knee.  Min assist provided to posterior knee and foot to achieve L knee and hip flexion during ambulation.  Pt unable to demonstrate ability to bend L knee to bring L foot off floor during ambulation but rather performs L hip hike causing her L hip to become sore.  Educated pt on reasoning behind technique.   Stairs Stairs: Yes Stairs assistance: Min guard Stair Management: Two rails;Step to pattern;Forwards Number of Stairs: 2 General stair comments: Demonstration and VCs for proper technique.  Pt completed w/ increased time.  Wheelchair Mobility    Modified Rankin (Stroke Patients Only)       Balance Overall balance assessment: Needs assistance Sitting-balance support: Bilateral upper extremity supported;Feet supported Sitting balance-Leahy Scale: Good     Standing balance support: Bilateral upper extremity supported;During functional activity Standing balance-Leahy Scale: Fair                      Cognition Arousal/Alertness: Awake/alert Behavior During Therapy: WFL for tasks assessed/performed Overall Cognitive Status: Within Functional Limits for tasks assessed                      Exercises Total Joint Exercises Knee Flexion: AROM;AAROM;Left;5 reps;Seated Goniometric ROM: 101 L knee flexion    General  Comments General comments (skin integrity, edema, etc.): Pt will benefit from at least one more therapy session prior to d/c to improve gait mechanics and safety prior to returning home.      Pertinent Vitals/Pain Pain Assessment: Faces Faces Pain Scale: Hurts even more Pain Location: L knee Pain Descriptors / Indicators: Grimacing;Guarding;Moaning Pain Intervention(s): Limited  activity within patient's tolerance;Monitored during session;Repositioned    Home Living                      Prior Function            PT Goals (current goals can now be found in the care plan section) Acute Rehab PT Goals Patient Stated Goal: to go home PT Goal Formulation: With patient/family Time For Goal Achievement: 07/12/14 Potential to Achieve Goals: Good Progress towards PT goals: Progressing toward goals    Frequency  7X/week    PT Plan Current plan remains appropriate    Co-evaluation             End of Session Equipment Utilized During Treatment: Gait belt Activity Tolerance: Patient limited by pain;Patient limited by fatigue Patient left: in chair;with call bell/phone within reach;with family/visitor present     Time: 1245-1317 PT Time Calculation (min) (ACUTE ONLY): 32 min  Charges:  $Gait Training: 8-22 mins $Therapeutic Exercise: 8-22 mins                    G Codes:      Michail Jewels PT, [place] [phone] Pager: [phone] 07/06/2014, 1:40 PM

## 2014-07-06 NOTE — Progress Notes (Signed)
Occupational Therapy Treatment Patient Details Name: Connie Ross MRN: [id number] DOB: [birth year] Today's Date: 07/06/2014    History of present illness Pt is a 59 y/o F s/p L TKA.  Pt's PMH includes R TKA, HTN, DMII, RA, anxiety.   OT comments  Patient progressing towards OT goals, continue plan of care for now. Pt limited due to pain and decreased mobility in LLE. Pt tends to swing LLE back then forward (without bending knee) during functional ambulation with RW; encouraged patient to bend hip->knee and step through without swinging leg backwards. Pt with poor carryover and understanding of this education taught. Daughters present and able to translate, both daughters understood therapist and importance of following thru with this technique.    Follow Up Recommendations  No OT follow up;Supervision/Assistance - 24 hour    Equipment Recommendations  None recommended by OT    Recommendations for Other Services  None at this time  Precautions / Restrictions Precautions Precautions: Fall;Knee Restrictions Weight Bearing Restrictions: Yes LLE Weight Bearing: Weight bearing as tolerated    Mobility Bed Mobility General bed mobility comments: did not occur  Transfers Overall transfer level: Needs assistance Equipment used: Rolling walker (2 wheeled) Transfers: Sit to/from Stand Sit to Stand: Supervision General transfer comment: supervision for safety, mod verbal cues for technique, sequencing, hand placement    Balance Overall balance assessment: Needs assistance Sitting-balance support: No upper extremity supported;Feet supported Sitting balance-Leahy Scale: Good     Standing balance support: Bilateral upper extremity supported;During functional activity Standing balance-Leahy Scale: Fair   ADL Overall ADL's : Needs assistance/impaired General ADL Comments: Pt with decreased functional mobility independence and with poor carryover regarding education taught.  Daughters are independent to assist patient as needed. Pt practiced simulated walk-in shower transfer using BSC. Educated patient and daughters on technique. Pt with poor carryover, but daughters independent to assist.     Cognition   Behavior During Therapy: WFL for tasks assessed/performed Overall Cognitive Status: Within Functional Limits for tasks assessed                  Pertinent Vitals/ Pain       Pain Assessment: Faces Faces Pain Scale: Hurts little more Pain Location: left knee Pain Descriptors / Indicators: Grimacing;Guarding Pain Intervention(s): Monitored during session;Limited activity within patient's tolerance;Repositioned   Frequency Min 2X/week     Progress Toward Goals  OT Goals(current goals can now befound in the care plan section)  Progress towards OT goals: Progressing toward goals     Plan Discharge plan remains appropriate    End of Session Equipment Utilized During Treatment: Rolling walker   Activity Tolerance Patient tolerated treatment well   Patient Left with call bell/phone within reach;with family/visitor present;in bed (EOB w/ daughters)    Time: 0315-9458 OT Time Calculation (min): 16 min  Charges: OT General Charges $OT Visit: 1 Procedure OT Treatments $Self Care/Home Management : 8-22 mins  Rigoberto Repass , MS, OTR/L, CLT Pager: [phone]  07/06/2014, 12:59 PM

## 2014-07-06 NOTE — Progress Notes (Signed)
Physical Therapy Treatment Patient Details Name: Connie Ross MRN: [id number] DOB: Sep 09, 1955 Today's Date: 07/06/2014    History of Present Illness Pt is a 59 y/o F s/p L TKA.  Pt's PMH includes R TKA, HTN, DMII, RA, anxiety.    PT Comments    Pt min assist for bed mobility and min guard assist w/ ambulation w/ improved gait mechanics this session.  Pt will benefit from continued skilled PT services to increase functional independence and safety.  Follow Up Recommendations  Home health PT;Supervision for mobility/OOB;SNF     Equipment Recommendations  Rolling walker with 5" wheels    Recommendations for Other Services       Precautions / Restrictions Precautions Precautions: Fall;Knee Precaution Comments: Reviewed no pillow under knee Restrictions Weight Bearing Restrictions: Yes LLE Weight Bearing: Weight bearing as tolerated    Mobility  Bed Mobility Overal bed mobility: Needs Assistance Bed Mobility: Supine to Sit     Supine to sit: Min assist     General bed mobility comments: Min assist managing LLE to EOB and assist pt w/ HHA to achieve sitting from supine.  Transfers Overall transfer level: Needs assistance Equipment used: Rolling walker (2 wheeled) Transfers: Sit to/from Stand Sit to Stand: Min guard         General transfer comment: Min guard for safety, pt w/ good technique  Ambulation/Gait Ambulation/Gait assistance: Min guard Ambulation Distance (Feet): 80 Feet Assistive device: Rolling walker (2 wheeled) Gait Pattern/deviations: Step-to pattern;Shuffle;Antalgic;Trunk flexed;Decreased stance time - left;Decreased weight shift to left;Decreased stride length   Gait velocity interpretation: Below normal speed for age/gender General Gait Details: Pt progresses quickly from LLE shuffle to LLE hip hike w/ slight L hip flexion to bring LLE off floor this session.  Trunk flexed and inc WB thorugh BUEs.   Stairs Stairs: Yes Stairs  assistance: Min guard Stair Management: Two rails;Step to pattern;Forwards Number of Stairs: 2 General stair comments: Demonstration and VCs for proper technique.  Pt completed w/ increased time.  Wheelchair Mobility    Modified Rankin (Stroke Patients Only)       Balance Overall balance assessment: Needs assistance Sitting-balance support: Bilateral upper extremity supported;Feet supported Sitting balance-Leahy Scale: Good     Standing balance support: Bilateral upper extremity supported;During functional activity Standing balance-Leahy Scale: Fair                      Cognition Arousal/Alertness: Awake/alert Behavior During Therapy: WFL for tasks assessed/performed Overall Cognitive Status: Within Functional Limits for tasks assessed                      Exercises Total Joint Exercises Heel Slides: AAROM;Left;5 reps;Supine Knee Flexion: AROM;AAROM;Left;5 reps;Seated Goniometric ROM: 101 L knee flexion    General Comments General comments (skin integrity, edema, etc.): Pt will benefit from at least one more therapy session prior to d/c to improve gait mechanics and safety prior to returning home.      Pertinent Vitals/Pain Pain Assessment: Faces Faces Pain Scale: Hurts a little bit Pain Location: L knee Pain Descriptors / Indicators: Aching Pain Intervention(s): Limited activity within patient's tolerance;Monitored during session;Repositioned    Home Living                      Prior Function            PT Goals (current goals can now be found in the care plan section) Acute Rehab PT Goals Patient Stated  Goal: to go home PT Goal Formulation: With patient/family Time For Goal Achievement: 07/12/14 Potential to Achieve Goals: Good Progress towards PT goals: Progressing toward goals    Frequency  7X/week    PT Plan Current plan remains appropriate    Co-evaluation             End of Session Equipment Utilized During  Treatment: Gait belt Activity Tolerance: Patient tolerated treatment well;Patient limited by fatigue Patient left: in chair;with call bell/phone within reach;with family/visitor present     Time: 6384-6659 PT Time Calculation (min) (ACUTE ONLY): 22 min  Charges:  $Gait Training: 8-22 mins $Therapeutic Exercise: 8-22 mins                    G Codes:      Michail Jewels PT, DPT [phone] Pager: [phone] 07/06/2014, 4:09 PM

## 2014-07-08 ENCOUNTER — Encounter (HOSPITAL_COMMUNITY): Payer: Self-pay | Admitting: Orthopedic Surgery

## 2014-07-16 ENCOUNTER — Ambulatory Visit (HOSPITAL_COMMUNITY): Payer: BLUE CROSS/BLUE SHIELD | Attending: Orthopedic Surgery | Admitting: Physical Therapy

## 2014-07-16 ENCOUNTER — Telehealth (HOSPITAL_COMMUNITY): Payer: Self-pay | Admitting: Physical Therapy

## 2014-07-16 DIAGNOSIS — R29898 Other symptoms and signs involving the musculoskeletal system: Secondary | ICD-10-CM | POA: Insufficient documentation

## 2014-07-16 DIAGNOSIS — R269 Unspecified abnormalities of gait and mobility: Secondary | ICD-10-CM

## 2014-07-16 DIAGNOSIS — M25561 Pain in right knee: Secondary | ICD-10-CM | POA: Diagnosis present

## 2014-07-16 DIAGNOSIS — M25562 Pain in left knee: Secondary | ICD-10-CM

## 2014-07-16 DIAGNOSIS — M25662 Stiffness of left knee, not elsewhere classified: Secondary | ICD-10-CM | POA: Insufficient documentation

## 2014-07-16 DIAGNOSIS — R2681 Unsteadiness on feet: Secondary | ICD-10-CM | POA: Diagnosis present

## 2014-07-16 DIAGNOSIS — Z96652 Presence of left artificial knee joint: Secondary | ICD-10-CM | POA: Diagnosis not present

## 2014-07-16 NOTE — Therapy (Signed)
[facility] [address] [place], [place], [address] Phone: [phone]   Fax:  [phone]  Physical Therapy Evaluation  Patient Details  Name: Connie Ross MRN: [id number] Date of Birth: December 03, 1955 Referring Provider:  Mirna Mires, MD  Encounter Date: 07/16/2014      PT End of Session - 07/16/14 1710    Visit Number 1   Number of Visits 18   Date for PT Re-Evaluation 08/13/14   Authorization Type BCBS    Authorization Time Period 07/16/14 to 09/16/14; need to send cert after front desk confirms that MD is correct    PT Start Time 1524   PT Stop Time 1601   PT Time Calculation (min) 37 min   Activity Tolerance Patient tolerated treatment well   Behavior During Therapy [facility] for tasks assessed/performed      Past Medical History  Diagnosis Date  . Hypertension   . GERD (gastroesophageal reflux disease)   . High cholesterol   . Type II diabetes mellitus   . Rheumatoid arthritis   . Anxiety     takes no meds  . DVT (deep venous thrombosis)     Past Surgical History  Procedure Laterality Date  . Total knee arthroplasty Right 02/28/2013  . Total knee arthroplasty Right 02/27/2013    Procedure: TOTAL KNEE ARTHROPLASTY- knee;  Surgeon: Nadara Mustard, MD;  Location: [facility] OR;  Service: Orthopedics;  Laterality: Right;  Right Total Knee Arthroplasty  . Eye surgery      bilateral cataracts  . Total knee arthroplasty Left 07/04/2014    Procedure: LEFT TOTAL KNEE ARTHROPLASTY;  Surgeon: Nadara Mustard, MD;  Location: [facility] OR;  Service: Orthopedics;  Laterality: Left;    There were no vitals filed for this visit.  Visit Diagnosis:  Status post total left knee replacement  Left knee pain  Knee stiffness, left  Weakness of both lower extremities  Unsteadiness  Abnormality of gait      Subjective Assessment - 07/16/14 1527    Subjective Having pain, which goes all the way up to her back. Walking around the house is going  well.    Pertinent History Patient had her knee replaced on July 1st; did not recieve HHPT. Things have been going well at home- patient has had the other knee replaced. Patient is currently taking medication for DVT.    How long can you stand comfortably? 20 minutes    How long can you walk comfortably? Walking is not a big problem   Patient Stated Goals be able to walk without assistive device    Currently in Pain? Yes   Pain Score 5    Pain Location Knee   Pain Orientation Left            [facility] PT Assessment - 07/16/14 0001    Assessment   Medical Diagnosis s/p L TKA    Onset Date/Surgical Date 07/04/14   Next MD Visit July 18th    Precautions   Precautions None   Restrictions   Weight Bearing Restrictions No   Balance Screen   Has the patient fallen in the past 6 months No   Has the patient had a decrease in activity level because of a fear of falling?  Yes   Is the patient reluctant to leave their home because of a fear of falling?  Yes   Prior Function   Level of Independence Independent;Independent with basic ADLs;Independent with gait;Independent with transfers   Vocation Unemployed  Leisure gardening, being outdoors    Observation/Other Assessments   Observations Min assist for    Posture/Postural Control   Posture Comments reduced weight bearing through L knee, flexed at hips, forward head, B shoulder IR    AROM   Left Knee Extension 8   Left Knee Flexion 96   Strength   Right Hip Flexion 3+/5   Right Hip ABduction 2+/5   Left Hip Flexion 2+/5   Left Hip ABduction 2+/5   Right Knee Flexion 4-/5   Right Knee Extension 4+/5   Left Knee Flexion 2+/5   Left Knee Extension 3+/5   Right Ankle Dorsiflexion 4+/5   Left Ankle Dorsiflexion 4-/5   Ambulation/Gait   Gait Comments reduced stance time L LE, reduced L TKE, flexed at hips, bialteral pronation, reduced step length R, reduced rotation hips and turnk, reduced gait speed                             PT Education - 07/16/14 1710    Education provided Yes   Education Details prognosis, HEP. plan of care    Person(s) Educated Patient;Child(ren)   Methods Explanation;Handout   Comprehension Verbalized understanding;Returned demonstration;Need further instruction          PT Short Term Goals - 07/16/14 1715    PT SHORT TERM GOAL #1   Title Patient will demonstrate no more than 4 degrees L knee extension and at least 110 degrees L knee flexion    Time 3   Period Weeks   Status New   PT SHORT TERM GOAL #2   Title Patient will demonstrate bilateral lower extremity strength of at least 4+/5 and proximal muscle strength of at least 4/5    Time 3   Period Weeks   Status New   PT SHORT TERM GOAL #3   Title Patient will be able to ambulate unlimited distances with single point cane, minimal unsteadiness, pain L knee no more than 2/10   Time 3   Period Weeks   Status New   PT SHORT TERM GOAL #4   Title Patient will be independent in correctly and consistently performing appropriate HEP, to be updated PRN    Time 3   Period Weeks   Status New           PT Long Term Goals - 07/16/14 1717    PT LONG TERM GOAL #1   Title Patient will experience 0/10 pain in L knee during all functional standing and ambulation tasks    Time 6   Period Weeks   Status New   PT LONG TERM GOAL #2   Title Patient will be able to ambulate unlimited distances with no assistive device and L knee pain 0/10   Time 6   Period Weeks   Status New   PT LONG TERM GOAL #3   Title Patient will demonstrate L knee extension of 0 degrees and L knee flexion of at least 120 degrees    Time 6   Period Weeks   Status New   PT LONG TERM GOAL #4   Title Patient will be able to ascend/descend at least 6 stairs with no railing, step over step pattern, no circumduction, pain L knee 0/10, no unsteadiness    Time 6   Period Weeks   Status New               Plan -  07/16/14 1711  Clinical Impression Statement Patient presents s/p L TKA with reduced lower extremity strength, reduced proximal muscle strength, reduced L knee ROM, postural and gait impairment, reduced functional activity tolerance, and reduced functional task performance skills. Patient and her family state that she is doing much better with this joint replacement, as she had a much harder time with the R knee that she had done in the past. Patient does not appear to be fluent in [place]; family present to translate. Patient will benefit from skilled PT services in order to address her impairments and assist her in reaching an optimal level of function.    Pt will benefit from skilled therapeutic intervention in order to improve on the following deficits Abnormal gait;Decreased coordination;Decreased range of motion;Difficulty walking;Decreased endurance;Obesity;Decreased activity tolerance;Pain;Decreased balance;Decreased scar mobility;Hypomobility;Impaired flexibility;Improper body mechanics;Decreased mobility;Decreased strength;Increased edema;Postural dysfunction   Rehab Potential Good   PT Frequency 3x / week   PT Duration 6 weeks   PT Treatment/Interventions ADLs/Self Care Home Management;Cryotherapy;Gait training;Stair training;DME Instruction;Functional mobility training;Therapeutic activities;Therapeutic exercise;Balance training;Neuromuscular re-education;Patient/family education;Manual techniques;Passive range of motion   PT Next Visit Plan review HEP and goals; functional stretching and strengthening, gait training with cane as able, manual PRN    PT Home Exercise Plan given    Consulted and Agree with Plan of Care Patient         Problem List Patient Active Problem List   Diagnosis Date Noted  . Abnormality of gait 03/15/2013  . Right knee pain 03/15/2013  . Total knee replacement status 02/27/2013    Nedra Hai PT, DPT [phone]  [facility] [address] [place], [place], [address] Phone: [phone]   Fax:  [phone]

## 2014-07-16 NOTE — Telephone Encounter (Signed)
07/17/14 and apptments at 11:45 am 3xs a week for two week. Patient declined those visit and will not start TX until 82/16-due to transportation> Daughter works until 5 pm in [place], [place]. Patient has been put on wait list for next 2 weeks. NF

## 2014-07-16 NOTE — Patient Instructions (Signed)
   ANKLE PUMPS - AP  Bend your foot up and down at your ankle joint as shown. You can do this exercise in sitting or laying down. Repeat 20 times, 3 times a day.    QUAD SET WITH TOWEL UNDER HEEL  While lying or sitting with a small towel roll under your ankle, tighten your top thigh muscle to press the back of your knee downward towards the ground. Repeat 15 times, twice a day.      ELASTIC BAND - HAMSTRING CURL  Bend your knee and draw back your foot.  Use your right leg, placed in front of your left, to help the left leg bend back even further. When you have bent the left leg as far as it can go, hold it for 3 seconds and then relax. Repeat 15 times, twice a day.     KEEP DOING THE EXERCISES YOU HAVE ALREADY BEEN DOING AT HOME; ALSO GET UP AND WALK AS MUCH AS YOU CAN TOLERATE.

## 2014-07-17 ENCOUNTER — Telehealth (HOSPITAL_COMMUNITY): Payer: Self-pay | Admitting: Physical Therapy

## 2014-07-17 NOTE — Addendum Note (Signed)
Addended by: Milinda Pointer on: 07/17/2014 01:39 PM   Modules accepted: Orders

## 2014-07-17 NOTE — Telephone Encounter (Signed)
Called Dr. Audrie Lia office he is out of town. Leftt message with Eunice Blase concerning pt declining offered apptments and that pt will be seen again on 08/05/2014.

## 2014-07-23 ENCOUNTER — Ambulatory Visit (HOSPITAL_COMMUNITY): Payer: BLUE CROSS/BLUE SHIELD

## 2014-07-23 DIAGNOSIS — M25561 Pain in right knee: Secondary | ICD-10-CM

## 2014-07-23 DIAGNOSIS — R2681 Unsteadiness on feet: Secondary | ICD-10-CM

## 2014-07-23 DIAGNOSIS — Z96652 Presence of left artificial knee joint: Secondary | ICD-10-CM

## 2014-07-23 DIAGNOSIS — M25662 Stiffness of left knee, not elsewhere classified: Secondary | ICD-10-CM

## 2014-07-23 DIAGNOSIS — R269 Unspecified abnormalities of gait and mobility: Secondary | ICD-10-CM

## 2014-07-23 DIAGNOSIS — R29898 Other symptoms and signs involving the musculoskeletal system: Secondary | ICD-10-CM

## 2014-07-23 DIAGNOSIS — M25562 Pain in left knee: Secondary | ICD-10-CM

## 2014-07-23 NOTE — Therapy (Signed)
[facility] [address] [place], [place], [address] Phone: [phone]   Fax:  [phone]  Physical Therapy Treatment  Patient Details  Name: Karisma Meiser MRN: [id number] Date of Birth: May 23, 1955 Referring Provider:  Nadara Mustard, MD  Encounter Date: 07/23/2014      PT End of Session - 07/23/14 1800    Visit Number 2   Number of Visits 18   Date for PT Re-Evaluation 08/13/14   Authorization Type BCBS    Authorization Time Period 07/16/14 to 09/16/14; need to send cert after front desk confirms that MD is correct    PT Start Time 1735   PT Stop Time 1835   PT Time Calculation (min) 60 min   Activity Tolerance Patient tolerated treatment well   Behavior During Therapy [facility] for tasks assessed/performed      Past Medical History  Diagnosis Date  . Hypertension   . GERD (gastroesophageal reflux disease)   . High cholesterol   . Type II diabetes mellitus   . Rheumatoid arthritis   . Anxiety     takes no meds  . DVT (deep venous thrombosis)     Past Surgical History  Procedure Laterality Date  . Total knee arthroplasty Right 02/28/2013  . Total knee arthroplasty Right 02/27/2013    Procedure: TOTAL KNEE ARTHROPLASTY- knee;  Surgeon: Nadara Mustard, MD;  Location: [facility] OR;  Service: Orthopedics;  Laterality: Right;  Right Total Knee Arthroplasty  . Eye surgery      bilateral cataracts  . Total knee arthroplasty Left 07/04/2014    Procedure: LEFT TOTAL KNEE ARTHROPLASTY;  Surgeon: Nadara Mustard, MD;  Location: [facility] OR;  Service: Orthopedics;  Laterality: Left;    There were no vitals filed for this visit.  Visit Diagnosis:  Status post total left knee replacement  Left knee pain  Knee stiffness, left  Weakness of both lower extremities  Unsteadiness  Abnormality of gait  Right knee pain      Subjective Assessment - 07/23/14 1741    Subjective Pt entered with daughter as interpreter for session, stated she  has been compliant with HEP 2-3x a day.  Current pain scale 5/10 Lt knee   Currently in Pain? Yes   Pain Score 5    Pain Location Knee   Pain Orientation Left   Pain Descriptors / Indicators Constant              [facility] Adult PT Treatment/Exercise - 07/23/14 0001    Exercises   Exercises Knee/Hip   Knee/Hip Exercises: Stretches   Active Hamstring Stretch Left;3 reps;30 seconds   Active Hamstring Stretch Limitations 12in step   Quad Stretch 3 reps;30 seconds   Quad Stretch Limitations prone with rope   Knee: Self-Stretch Limitations knee drives on 12in step   Gastroc Stretch 3 reps;30 seconds   Gastroc Stretch Limitations slant board   Knee/Hip Exercises: Aerobic   Stationary Bike Rocking seat 7 for ROM able to go backwards   Knee/Hip Exercises: Standing   Heel Raises 10 reps   Knee Flexion 10 reps   Knee Flexion Limitations therapist facilitation for proper form   Knee/Hip Exercises: Supine   Quad Sets 10 reps   Short Arc Quad Sets 10 reps             PT Short Term Goals - 07/23/14 1822    PT SHORT TERM GOAL #1   Title Patient will demonstrate no more than 4 degrees L  knee extension and at least 110 degrees L knee flexion    Status On-going   PT SHORT TERM GOAL #2   Title Patient will demonstrate bilateral lower extremity strength of at least 4+/5 and proximal muscle strength of at least 4/5    PT SHORT TERM GOAL #3   Title Patient will be able to ambulate unlimited distances with single point cane, minimal unsteadiness, pain L knee no more than 2/10   PT SHORT TERM GOAL #4   Title Patient will be independent in correctly and consistently performing appropriate HEP, to be updated PRN    Status On-going           PT Long Term Goals - 07/23/14 1823    PT LONG TERM GOAL #1   Title Patient will experience 0/10 pain in L knee during all functional standing and ambulation tasks    PT LONG TERM GOAL #2   Title Patient will be able to ambulate unlimited distances  with no assistive device and L knee pain 0/10   PT LONG TERM GOAL #3   Title Patient will demonstrate L knee extension of 0 degrees and L knee flexion of at least 120 degrees    PT LONG TERM GOAL #4   Title Patient will be able to ascend/descend at least 6 stairs with no railing, step over step pattern, no circumduction, pain L knee 0/10, no unsteadiness                Plan - 07/23/14 1800    Clinical Impression Statement Reviewed goals, compliance with HEP and copy of evaluation given to pt. and her daugther who was the interpreter for session today.  Session focus on improving ROM with stretches, stationary bike initially rocking then able to make full revolution following cueing for technique.  Pt and daughter explained importance of achieving full ROM to improve gait mechanics and reduce pain.  Ended session with ice applied to knee for pain and edema control.   PT Next Visit Plan Next session continue stretches, recumbent bike and quad strengthening exercises for ROM, progress functional stretching and strengthening, gait training with cane as able, manual PRN         Problem List Patient Active Problem List   Diagnosis Date Noted  . Abnormality of gait 03/15/2013  . Right knee pain 03/15/2013  . Total knee replacement status 02/27/2013   Becky Sax, LPTA; CBIS [id number]  Juel Burrow 07/23/2014, 6:25 PM  [facility] [address] [place], [place], [address] Phone: [phone]   Fax:  [phone]

## 2014-08-05 ENCOUNTER — Encounter (HOSPITAL_COMMUNITY): Payer: BLUE CROSS/BLUE SHIELD

## 2014-08-06 ENCOUNTER — Ambulatory Visit (HOSPITAL_COMMUNITY): Payer: BLUE CROSS/BLUE SHIELD

## 2014-08-11 ENCOUNTER — Ambulatory Visit (HOSPITAL_COMMUNITY): Payer: BLUE CROSS/BLUE SHIELD | Attending: Orthopedic Surgery | Admitting: Physical Therapy

## 2014-08-11 DIAGNOSIS — M25662 Stiffness of left knee, not elsewhere classified: Secondary | ICD-10-CM | POA: Diagnosis present

## 2014-08-11 DIAGNOSIS — M25562 Pain in left knee: Secondary | ICD-10-CM | POA: Diagnosis present

## 2014-08-11 DIAGNOSIS — M25561 Pain in right knee: Secondary | ICD-10-CM

## 2014-08-11 DIAGNOSIS — R29898 Other symptoms and signs involving the musculoskeletal system: Secondary | ICD-10-CM

## 2014-08-11 DIAGNOSIS — R269 Unspecified abnormalities of gait and mobility: Secondary | ICD-10-CM | POA: Diagnosis present

## 2014-08-11 DIAGNOSIS — Z96652 Presence of left artificial knee joint: Secondary | ICD-10-CM

## 2014-08-11 DIAGNOSIS — R2681 Unsteadiness on feet: Secondary | ICD-10-CM

## 2014-08-11 NOTE — Therapy (Signed)
[facility] [address] [place], [place], [address] Phone: [phone]   Fax:  [phone]  Physical Therapy Treatment  Patient Details  Name: Connie Ross MRN: [id number] Date of Birth: 03-22-1955 Referring Provider:  Nadara Mustard, MD  Encounter Date: 08/11/2014      PT End of Session - 08/11/14 1731    Visit Number 3   Number of Visits 18   Date for PT Re-Evaluation 08/13/14   Authorization Type BCBS    Authorization Time Period 07/16/14 to 09/16/14; need to send cert after front desk confirms that MD is correct    PT Start Time 1700   PT Stop Time 1745   PT Time Calculation (min) 45 min   Activity Tolerance Patient tolerated treatment well   Behavior During Therapy [facility] for tasks assessed/performed      Past Medical History  Diagnosis Date  . Hypertension   . GERD (gastroesophageal reflux disease)   . High cholesterol   . Type II diabetes mellitus   . Rheumatoid arthritis   . Anxiety     takes no meds  . DVT (deep venous thrombosis)     Past Surgical History  Procedure Laterality Date  . Total knee arthroplasty Right 02/28/2013  . Total knee arthroplasty Right 02/27/2013    Procedure: TOTAL KNEE ARTHROPLASTY- knee;  Surgeon: Nadara Mustard, MD;  Location: [facility] OR;  Service: Orthopedics;  Laterality: Right;  Right Total Knee Arthroplasty  . Eye surgery      bilateral cataracts  . Total knee arthroplasty Left 07/04/2014    Procedure: LEFT TOTAL KNEE ARTHROPLASTY;  Surgeon: Nadara Mustard, MD;  Location: [facility] OR;  Service: Orthopedics;  Laterality: Left;    There were no vitals filed for this visit.  Visit Diagnosis:  Status post total left knee replacement  Left knee pain  Knee stiffness, left  Weakness of both lower extremities  Unsteadiness  Abnormality of gait  Right knee pain      Subjective Assessment - 08/11/14 1759    Subjective Pt accompanied by daughter and grandson.  States her Lt LE doesnt  hurt anymore and is having more issues with her Rt LE and foot.  Daughter states she intends on taking her to a podiatrist.     Currently in Pain? Yes   Pain Score 3             [facility] PT Assessment - 08/11/14 0001    Assessment   Medical Diagnosis s/p L TKA    AROM   Left Knee Extension 8   Left Knee Flexion 115                     [facility] Adult PT Treatment/Exercise - 08/11/14 1718    Knee/Hip Exercises: Stretches   Active Hamstring Stretch Left;3 reps;30 seconds   Active Hamstring Stretch Limitations 12in step   Knee: Self-Stretch to increase Flexion Left;10 seconds   Knee: Self-Stretch Limitations knee drives on 12in step   Knee/Hip Exercises: Aerobic   Stationary Bike full rev seat 6 for ROM able to go backwards   Knee/Hip Exercises: Standing   Heel Raises 10 reps   Heel Raises Limitations toeraises 10 reps   Knee Flexion 10 reps   Stairs 4" reciprocally with bilateral HR's 2RT   Gait Training QC 100 feet   Knee/Hip Exercises: Supine   Quad Sets 10 reps   Short Arc Quad Sets 10 reps   Knee Extension  PROM   Knee Extension Limitations 8   Knee Flexion AAROM   Knee Flexion Limitations 115                  PT Short Term Goals - 07/23/14 1822    PT SHORT TERM GOAL #1   Title Patient will demonstrate no more than 4 degrees L knee extension and at least 110 degrees L knee flexion    Status On-going   PT SHORT TERM GOAL #2   Title Patient will demonstrate bilateral lower extremity strength of at least 4+/5 and proximal muscle strength of at least 4/5    PT SHORT TERM GOAL #3   Title Patient will be able to ambulate unlimited distances with single point cane, minimal unsteadiness, pain L knee no more than 2/10   PT SHORT TERM GOAL #4   Title Patient will be independent in correctly and consistently performing appropriate HEP, to be updated PRN    Status On-going           PT Long Term Goals - 07/23/14 1823    PT LONG TERM GOAL #1   Title  Patient will experience 0/10 pain in L knee during all functional standing and ambulation tasks    PT LONG TERM GOAL #2   Title Patient will be able to ambulate unlimited distances with no assistive device and L knee pain 0/10   PT LONG TERM GOAL #3   Title Patient will demonstrate L knee extension of 0 degrees and L knee flexion of at least 120 degrees    PT LONG TERM GOAL #4   Title Patient will be able to ascend/descend at least 6 stairs with no railing, step over step pattern, no circumduction, pain L knee 0/10, no unsteadiness                Plan - 08/11/14 1804    Clinical Impression Statement Pt comes today for 3rd session completed in nearly 4 weeks.  Daughter states there was conflicting appointments that caused her to cancel most of her appointments.  PT overall doing well with decreasing pain and increasing function.  continues to ambualte wtih a RW; worked on gait with QC today with good sequencing noted.  Progressed to stair training also with ability to complete 4" height reciprocally using bialteral UE's.  Noted improvment in flexion with abiltiy to make full revolutions on bike and measurement today of 115 degrees.     PT Next Visit Plan re-assessment needed next visit as patient at 4 week point.  Progress Lt quad strength, functional strength and improve knee extension.        Problem List Patient Active Problem List   Diagnosis Date Noted  . Abnormality of gait 03/15/2013  . Right knee pain 03/15/2013  . Total knee replacement status 02/27/2013    Lurena Nida, PTA/CLT [id number]  08/11/2014, 6:09 PM  [facility] [address] [place], [place], [address] Phone: [phone]   Fax:  [phone]

## 2014-08-13 ENCOUNTER — Ambulatory Visit (HOSPITAL_COMMUNITY): Payer: BLUE CROSS/BLUE SHIELD | Admitting: Physical Therapy

## 2014-08-13 DIAGNOSIS — R2681 Unsteadiness on feet: Secondary | ICD-10-CM

## 2014-08-13 DIAGNOSIS — Z96652 Presence of left artificial knee joint: Secondary | ICD-10-CM | POA: Diagnosis not present

## 2014-08-13 DIAGNOSIS — M25562 Pain in left knee: Secondary | ICD-10-CM

## 2014-08-13 DIAGNOSIS — M25561 Pain in right knee: Secondary | ICD-10-CM

## 2014-08-13 DIAGNOSIS — R29898 Other symptoms and signs involving the musculoskeletal system: Secondary | ICD-10-CM

## 2014-08-13 DIAGNOSIS — R269 Unspecified abnormalities of gait and mobility: Secondary | ICD-10-CM

## 2014-08-13 DIAGNOSIS — M25662 Stiffness of left knee, not elsewhere classified: Secondary | ICD-10-CM

## 2014-08-13 NOTE — Therapy (Signed)
[place], [place], [address] Phone: [phone]   Fax:  [phone]  Physical Therapy Treatment (Re-Assessment)  Patient Details  Name: Connie Ross MRN: [id number] Date of Birth: 1955/09/13 Referring Provider:  Newt Minion, MD  Encounter Date: 08/13/2014      PT End of Session - 08/13/14 1737    Visit Number 4   Number of Visits 18   Date for PT Re-Evaluation 09/10/14   Authorization Type BCBS    Authorization Time Period 07/16/14 to 09/16/14   PT Start Time 1650   PT Stop Time 1732   PT Time Calculation (min) 42 min   Activity Tolerance Patient tolerated treatment well   Behavior During Therapy [facility] for tasks assessed/performed      Past Medical History  Diagnosis Date  . Hypertension   . GERD (gastroesophageal reflux disease)   . High cholesterol   . Type II diabetes mellitus   . Rheumatoid arthritis   . Anxiety     takes no meds  . DVT (deep venous thrombosis)     Past Surgical History  Procedure Laterality Date  . Total knee arthroplasty Right 02/28/2013  . Total knee arthroplasty Right 02/27/2013    Procedure: TOTAL KNEE ARTHROPLASTY- knee;  Surgeon: Newt Minion, MD;  Location: [place];  Service: Orthopedics;  Laterality: Right;  Right Total Knee Arthroplasty  . Eye surgery      bilateral cataracts  . Total knee arthroplasty Left 07/04/2014    Procedure: LEFT TOTAL KNEE ARTHROPLASTY;  Surgeon: Newt Minion, MD;  Location: [place];  Service: Orthopedics;  Laterality: Left;    There were no vitals filed for this visit.  Visit Diagnosis:  Status post total left knee replacement  Left knee pain  Knee stiffness, left  Weakness of both lower extremities  Unsteadiness  Abnormality of gait  Right knee pain      Subjective Assessment - 08/13/14 1650    Subjective Patient accompanied by family, who acted as a Optometrist. Patient reports that she is just having a little bit of  pain and doing very well at home, and pain is being well taken care of by pain medicnies.    Pertinent History Patient had her knee replaced on July 1st; did not recieve HHPT. Things have been going well at home- patient has had the other knee replaced. Patient is currently taking medication for DVT.    How long can you stand comfortably? 8/10- maybe a couple of hours, "good long time but not that long"    How long can you walk comfortably? 8/10- walking is still going well    Patient Stated Goals be able to walk without assistive device    Currently in Pain? Yes   Pain Score 2    Pain Location Knee   Pain Orientation Left            [facility] PT Assessment - 08/13/14 0001    AROM   Left Knee Extension 7   Left Knee Flexion 113   Strength   Right Hip Flexion 3+/5   Right Hip ABduction 3-/5   Left Hip Flexion 3+/5   Left Hip ABduction 3/5   Right Knee Flexion 4-/5   Right Knee Extension 5/5   Left Knee Flexion 2+/5   Left Knee Extension 4-/5   Right Ankle Dorsiflexion 5/5   Left Ankle Dorsiflexion 4/5  [place] Adult PT Treatment/Exercise - 08/13/14 0001    Ambulation/Gait   Ambulation/Gait Yes   Ambulation/Gait Assistance 5: Supervision   Assistive device Small based quad cane   Gait Comments ambulation with quad cane with focus on improving gait pattern and developing full reciprocal pattern; patient had difficulty in sequencing but did improve with practice    Knee/Hip Exercises: Stretches   Active Hamstring Stretch Both;2 reps;30 seconds   Active Hamstring Stretch Limitations 12 inch step    Knee: Self-Stretch to increase Flexion Left;10 seconds   Knee: Self-Stretch Limitations 10 reps, 10 seconds    Gastroc Stretch 2 reps;30 seconds   Gastroc Stretch Limitations slantboard                 PT Education - 08/13/14 1736    Education provided Yes   Education Details prognosis, plan of care moving forward, sequencing with [facility]     Person(s) Educated Patient;Child(ren)   Methods Explanation   Comprehension Verbalized understanding          PT Short Term Goals - 08/13/14 1702    PT SHORT TERM GOAL #1   Title Patient will demonstrate no more than 4 degrees L knee extension and at least 110 degrees L knee flexion    Baseline 8/10- 7 degrees extension, 113 flexion    Time 3   Period Weeks   Status Partially Met   PT SHORT TERM GOAL #2   Title Patient will demonstrate bilateral lower extremity strength of at least 4+/5 and proximal muscle strength of at least 4/5    Time 3   Period Weeks   Status On-going   PT SHORT TERM GOAL #3   Title Patient will be able to ambulate unlimited distances with single point cane, minimal unsteadiness, pain L knee no more than 2/10   Baseline 8/10- now using quad cane primarily inside, will use either two canes or walker outside    Time 3   Period Weeks   Status On-going   PT SHORT TERM GOAL #4   Title Patient will be independent in correctly and consistently performing appropriate HEP, to be updated PRN    Baseline 8/10- patient reports she is compliant with HEP    Time 3   Period Weeks   Status Achieved           PT Long Term Goals - 08/13/14 1706    PT LONG TERM GOAL #1   Title Patient will experience 0/10 pain in L knee during all functional standing and ambulation tasks    Baseline 8/10- not really hurting during the day, but is bothering her at night    Time 6   Period Weeks   Status Achieved   PT LONG TERM GOAL #2   Title Patient will be able to ambulate unlimited distances with no assistive device and L knee pain 0/10   Time 6   Period Weeks   Status On-going   PT LONG TERM GOAL #3   Title Patient will demonstrate L knee extension of 0 degrees and L knee flexion of at least 120 degrees    Baseline 8/10-  7 degrees extension, 113 degrees flexion    Time 6   Period Weeks   Status On-going   PT LONG TERM GOAL #4   Title Patient will be able to  ascend/descend at least 6 stairs with no railing, step over step pattern, no circumduction, pain L knee 0/10, no unsteadiness    Time 6  Period Weeks   Status On-going               Plan - 08/13/14 1737    Clinical Impression Statement Re-asssesment performed today. Patient is improving in ROM and strength and has recently been trying to use [facility] for gait as much as she can, but still uses walker when she goes outside. Patient states that her main problem is stiffness during the day and pain at night when she is trying to sleep right now. Initiated gait training with [facility] today; patient initially had difficulty with sequence and performed 3 point gait but improved to closer to correct pattern with practice, requires further training. At this time she will benefit from skilled PT services to continue at 3x/week for antoher 4 weeks to continue to address her deficits and optimize her level of function.    Pt will benefit from skilled therapeutic intervention in order to improve on the following deficits Abnormal gait;Decreased coordination;Decreased range of motion;Difficulty walking;Decreased endurance;Obesity;Decreased activity tolerance;Pain;Decreased balance;Decreased scar mobility;Hypomobility;Impaired flexibility;Improper body mechanics;Decreased mobility;Decreased strength;Increased edema;Postural dysfunction   Rehab Potential Good   PT Frequency 3x / week   PT Duration 6 weeks   PT Treatment/Interventions ADLs/Self Care Home Management;Cryotherapy;Gait training;Stair training;DME Instruction;Functional mobility training;Therapeutic activities;Therapeutic exercise;Balance training;Neuromuscular re-education;Patient/family education;Manual techniques;Passive range of motion   PT Next Visit Plan Continue focus on ROM and knee mobility, functional strength as tolerated. Gait training with [facility].    PT Home Exercise Plan given    Consulted and Agree with Plan of Care Patient         Problem List Patient Active Problem List   Diagnosis Date Noted  . Abnormality of gait 03/15/2013  . Right knee pain 03/15/2013  . Total knee replacement status 02/27/2013    Physical Therapy Progress Note  Dates of Reporting Period: 07/16/14 to 08/13/14  Objective Reports of Subjective Statement: see above   Objective Measurements: see above   Goal Update: see above  Plan: see above   Reason Skilled Services are Required: L knee stiffness, lower extremity/proximal muscle weakness, gait and postural deviations, reduced functional activity tolerance    Deniece Ree PT, DPT [place], [place], [address] Phone: [phone]   Fax:  [phone]

## 2014-08-15 ENCOUNTER — Telehealth (HOSPITAL_COMMUNITY): Payer: Self-pay

## 2014-08-15 ENCOUNTER — Ambulatory Visit (HOSPITAL_COMMUNITY): Payer: BLUE CROSS/BLUE SHIELD

## 2014-08-15 DIAGNOSIS — R29898 Other symptoms and signs involving the musculoskeletal system: Secondary | ICD-10-CM

## 2014-08-15 DIAGNOSIS — R2681 Unsteadiness on feet: Secondary | ICD-10-CM

## 2014-08-15 DIAGNOSIS — Z96652 Presence of left artificial knee joint: Secondary | ICD-10-CM | POA: Diagnosis not present

## 2014-08-15 DIAGNOSIS — M25562 Pain in left knee: Secondary | ICD-10-CM

## 2014-08-15 DIAGNOSIS — M25662 Stiffness of left knee, not elsewhere classified: Secondary | ICD-10-CM

## 2014-08-15 DIAGNOSIS — R269 Unspecified abnormalities of gait and mobility: Secondary | ICD-10-CM

## 2014-08-15 DIAGNOSIS — M25561 Pain in right knee: Secondary | ICD-10-CM

## 2014-08-15 NOTE — Therapy (Signed)
[place], [place], [address] Phone: [phone]   Fax:  [phone]  Physical Therapy Treatment  Patient Details  Name: Connie Ross MRN: [id number] Date of Birth: [birth year] Referring Provider:  Newt Minion, MD  Encounter Date: 08/15/2014      PT End of Session - 08/15/14 1703    Visit Number 5   Number of Visits 18   Date for PT Re-Evaluation 09/10/14   Authorization Type BCBS    Authorization Time Period 07/16/14 to 09/16/14   PT Start Time 1652   PT Stop Time 1745   PT Time Calculation (min) 53 min   Activity Tolerance Patient tolerated treatment well   Behavior During Therapy [facility] for tasks assessed/performed      Past Medical History  Diagnosis Date  . Hypertension   . GERD (gastroesophageal reflux disease)   . High cholesterol   . Type II diabetes mellitus   . Rheumatoid arthritis   . Anxiety     takes no meds  . DVT (deep venous thrombosis)     Past Surgical History  Procedure Laterality Date  . Total knee arthroplasty Right 02/28/2013  . Total knee arthroplasty Right 02/27/2013    Procedure: TOTAL KNEE ARTHROPLASTY- knee;  Surgeon: Newt Minion, MD;  Location: [place];  Service: Orthopedics;  Laterality: Right;  Right Total Knee Arthroplasty  . Eye surgery      bilateral cataracts  . Total knee arthroplasty Left 07/04/2014    Procedure: LEFT TOTAL KNEE ARTHROPLASTY;  Surgeon: Newt Minion, MD;  Location: [place];  Service: Orthopedics;  Laterality: Left;    There were no vitals filed for this visit.  Visit Diagnosis:  Status post total left knee replacement  Left knee pain  Weakness of both lower extremities  Unsteadiness  Abnormality of gait  Right knee pain  Knee stiffness, left      Subjective Assessment - 08/15/14 1659    Subjective Patient accompanied by family, who acted as a Optometrist. Pt stated pain scale 5/10.  Reports compliance with HEP daily.   Currently in Pain? Yes   Pain Score 5    Pain Location Knee   Pain Orientation Left   Pain Descriptors / Indicators Aching           [facility] Adult PT Treatment/Exercise - 08/15/14 0001    Exercises   Exercises Knee/Hip   Knee/Hip Exercises: Stretches   Active Hamstring Stretch 2 reps;30 seconds;Left   Active Hamstring Stretch Limitations 12 inch step    Gastroc Stretch 3 reps;30 seconds   Gastroc Stretch Limitations slantboard    Knee/Hip Exercises: Standing   Heel Raises 10 reps   Heel Raises Limitations toeraises 10 reps   Knee Flexion 10 reps   Terminal Knee Extension 10 reps;Theraband   Theraband Level (Terminal Knee Extension) Level 3 (Green)   Gait Training [facility] gait training x 300 feet with cueing for 2 point sequence   Knee/Hip Exercises: Supine   Quad Sets 10 reps   Short Arc Quad Sets 15 reps   Knee Extension PROM             PT Short Term Goals - 08/13/14 1702    PT SHORT TERM GOAL #1   Title Patient will demonstrate no more than 4 degrees L knee extension and at least 110 degrees L knee flexion    Baseline 8/10- 7 degrees extension, 113 flexion    Time 3  Period Weeks   Status Partially Met   PT SHORT TERM GOAL #2   Title Patient will demonstrate bilateral lower extremity strength of at least 4+/5 and proximal muscle strength of at least 4/5    Time 3   Period Weeks   Status On-going   PT SHORT TERM GOAL #3   Title Patient will be able to ambulate unlimited distances with single point cane, minimal unsteadiness, pain L knee no more than 2/10   Baseline 8/10- now using quad cane primarily inside, will use either two canes or walker outside    Time 3   Period Weeks   Status On-going   PT SHORT TERM GOAL #4   Title Patient will be independent in correctly and consistently performing appropriate HEP, to be updated PRN    Baseline 8/10- patient reports she is compliant with HEP    Time 3   Period Weeks   Status Achieved           PT Long Term  Goals - 08/13/14 1706    PT LONG TERM GOAL #1   Title Patient will experience 0/10 pain in L knee during all functional standing and ambulation tasks    Baseline 8/10- not really hurting during the day, but is bothering her at night    Time 6   Period Weeks   Status Achieved   PT LONG TERM GOAL #2   Title Patient will be able to ambulate unlimited distances with no assistive device and L knee pain 0/10   Time 6   Period Weeks   Status On-going   PT LONG TERM GOAL #3   Title Patient will demonstrate L knee extension of 0 degrees and L knee flexion of at least 120 degrees    Baseline 8/10-  7 degrees extension, 113 degrees flexion    Time 6   Period Weeks   Status On-going   PT LONG TERM GOAL #4   Title Patient will be able to ascend/descend at least 6 stairs with no railing, step over step pattern, no circumduction, pain L knee 0/10, no unsteadiness    Time 6   Period Weeks   Status On-going               Plan - 08/15/14 1703    Clinical Impression Statement Pt entered dept ambulating with [facility] with inconsistent 3 point sequence gait.  Began session with gait training to improve 2 point sequence with appropriate pattern following demonstration and cueing for technique.   Session focus on improving gait mechanics, ROM and functional strenghteing with cueing for form and technique through session.  Pt with max cueing required for heel and  toe raises for proper form due to weakness.  Added theraband resistance for dorsi and plantarflexion, pt given HEP printout and theraband for ankle strengthening and terminal knee extension (in Spanish.  No reports of increased pain through session.     PT Next Visit Plan Continue focus on ROM and knee mobility, functional strength as tolerated. Gait training with [facility].         Problem List Patient Active Problem List   Diagnosis Date Noted  . Abnormality of gait 03/15/2013  . Right knee pain 03/15/2013  . Total knee replacement status  02/27/2013   Ihor Austin, [place]; [place]  Aldona Lento 08/15/2014, 5:52 PM  [place] [address] [place], [place], [address] Phone: [phone]   Fax:  [phone]

## 2014-08-15 NOTE — Patient Instructions (Signed)
Dorsiflexion: Resisted   De frente al punto de agarre de una banda elstica, con el otro extremo alrededor del pie izquierdo, lleve la punta del pie hacia usted. Repita 10 veces por rutina. Realice 10-20  rutinas por sesin. Realice 1-2  sesiones por da.  http://orth.exer.us/9   Copyright  VHI. All rights reserved.   Plantar Flexion With Band   Con la banda elstica alrededor de la planta del pie, presione el pie hacia abajo mientras mantiene la resistencia con los brazos. Sostenga 10 segundos. Repita 10-20 veces. Realice 1-2 sesiones por da.  Copyright  VHI. All rights reserved.  . Knee Extension: Terminal - Standing (Single Leg)   De frente al tubo elstico, con los pies separados al ancho de los hombros y con el tubo alrededor de la rodilla. Permita que la tensin del tubo doble levemente su rodilla. Lleve la pierna hacia atrs, estirando la rodilla. Repita 10 veces por ejercicio. Repita con la otra pierna. Haga 10-20 ejercicios por sesin. Haga 3-5 sesiones por semana. Altura de la fijacin: La rodilla  http://tub.exer.us/36   Copyright  VHI. All rights reserved.

## 2014-08-18 ENCOUNTER — Ambulatory Visit (HOSPITAL_COMMUNITY): Payer: BLUE CROSS/BLUE SHIELD | Admitting: Physical Therapy

## 2014-08-18 DIAGNOSIS — R2681 Unsteadiness on feet: Secondary | ICD-10-CM

## 2014-08-18 DIAGNOSIS — M25561 Pain in right knee: Secondary | ICD-10-CM

## 2014-08-18 DIAGNOSIS — M25562 Pain in left knee: Secondary | ICD-10-CM

## 2014-08-18 DIAGNOSIS — Z96652 Presence of left artificial knee joint: Secondary | ICD-10-CM

## 2014-08-18 DIAGNOSIS — M25662 Stiffness of left knee, not elsewhere classified: Secondary | ICD-10-CM

## 2014-08-18 DIAGNOSIS — R29898 Other symptoms and signs involving the musculoskeletal system: Secondary | ICD-10-CM

## 2014-08-18 DIAGNOSIS — R269 Unspecified abnormalities of gait and mobility: Secondary | ICD-10-CM

## 2014-08-18 NOTE — Therapy (Signed)
[place], [place], [address] Phone: [phone]   Fax:  [phone]  Physical Therapy Treatment  Patient Details  Name: Connie Ross MRN: [id number] Date of Birth: 01/29/1955 Referring Provider:  Iona Beard, MD  Encounter Date: 08/18/2014      PT End of Session - 08/18/14 1804    Visit Number 6   Number of Visits 18   Date for PT Re-Evaluation 09/10/14   Authorization Type BCBS    Authorization Time Period 07/16/14 to 09/16/14   PT Start Time 1650   PT Stop Time 1735   PT Time Calculation (min) 45 min   Activity Tolerance Patient tolerated treatment well   Behavior During Therapy [facility] for tasks assessed/performed      Past Medical History  Diagnosis Date  . Hypertension   . GERD (gastroesophageal reflux disease)   . High cholesterol   . Type II diabetes mellitus   . Rheumatoid arthritis   . Anxiety     takes no meds  . DVT (deep venous thrombosis)     Past Surgical History  Procedure Laterality Date  . Total knee arthroplasty Right 02/28/2013  . Total knee arthroplasty Right 02/27/2013    Procedure: TOTAL KNEE ARTHROPLASTY- knee;  Surgeon: Newt Minion, MD;  Location: [place];  Service: Orthopedics;  Laterality: Right;  Right Total Knee Arthroplasty  . Eye surgery      bilateral cataracts  . Total knee arthroplasty Left 07/04/2014    Procedure: LEFT TOTAL KNEE ARTHROPLASTY;  Surgeon: Newt Minion, MD;  Location: [place];  Service: Orthopedics;  Laterality: Left;    There were no vitals filed for this visit.  Visit Diagnosis:  Status post total left knee replacement  Left knee pain  Weakness of both lower extremities  Unsteadiness  Abnormality of gait  Right knee pain  Knee stiffness, left      Subjective Assessment - 08/18/14 1745    Subjective Patient's grandson Engineer, maintenance (IT)) reports she's been working hard on improving her walking.  States she has minimal pain of 2/10 in her  LT knee.   Currently in Pain? Yes   Pain Score 2    Pain Location Knee   Pain Orientation Left                         [facility] Adult PT Treatment/Exercise - 08/18/14 1704    Knee/Hip Exercises: Stretches   Active Hamstring Stretch 2 reps;30 seconds;Left   Active Hamstring Stretch Limitations 12 inch step    Knee: Self-Stretch to increase Flexion Left;10 seconds   Knee: Self-Stretch Limitations 10 reps, 10 seconds    Gastroc Stretch 3 reps;30 seconds   Gastroc Stretch Limitations slantboard    Knee/Hip Exercises: Standing   Heel Raises 15 reps   Heel Raises Limitations toeraises 15 reps   Knee Flexion 15 reps   Forward Lunges Both;10 reps   Forward Lunges Limitations onto floor with 1 HHA   Lateral Step Up Left;10 reps;Hand Hold: 1;Step Height: 2"   Forward Step Up Left;10 reps;Step Height: 4";Hand Hold: 1   Knee/Hip Exercises: Seated   Long Arc Quad Left;15 reps;Weights   Long Arc Quad Weight 4 lbs.   Sit to Sand 10 reps;without UE support  Lt futher back                  PT Short Term Goals - 08/13/14 1702    PT SHORT  TERM GOAL #1   Title Patient will demonstrate no more than 4 degrees L knee extension and at least 110 degrees L knee flexion    Baseline 8/10- 7 degrees extension, 113 flexion    Time 3   Period Weeks   Status Partially Met   PT SHORT TERM GOAL #2   Title Patient will demonstrate bilateral lower extremity strength of at least 4+/5 and proximal muscle strength of at least 4/5    Time 3   Period Weeks   Status On-going   PT SHORT TERM GOAL #3   Title Patient will be able to ambulate unlimited distances with single point cane, minimal unsteadiness, pain L knee no more than 2/10   Baseline 8/10- now using quad cane primarily inside, will use either two canes or walker outside    Time 3   Period Weeks   Status On-going   PT SHORT TERM GOAL #4   Title Patient will be independent in correctly and consistently performing appropriate  HEP, to be updated PRN    Baseline 8/10- patient reports she is compliant with HEP    Time 3   Period Weeks   Status Achieved           PT Long Term Goals - 08/13/14 1706    PT LONG TERM GOAL #1   Title Patient will experience 0/10 pain in L knee during all functional standing and ambulation tasks    Baseline 8/10- not really hurting during the day, but is bothering her at night    Time 6   Period Weeks   Status Achieved   PT LONG TERM GOAL #2   Title Patient will be able to ambulate unlimited distances with no assistive device and L knee pain 0/10   Time 6   Period Weeks   Status On-going   PT LONG TERM GOAL #3   Title Patient will demonstrate L knee extension of 0 degrees and L knee flexion of at least 120 degrees    Baseline 8/10-  7 degrees extension, 113 degrees flexion    Time 6   Period Weeks   Status On-going   PT LONG TERM GOAL #4   Title Patient will be able to ascend/descend at least 6 stairs with no railing, step over step pattern, no circumduction, pain L knee 0/10, no unsteadiness    Time 6   Period Weeks   Status On-going               Plan - 08/18/14 1749    Clinical Impression Statement Much improved gait quality today using narrow base QC.  Added forward lunges and step ups today.  Pt required therapist facilitation to bend knee to utilize correct usage of quadricep with all actvities.  PT tends to substitute and use hip musculature and c/o hip cramping and burning.  Pt unable to complete lateral step up with 4" without severe substitutions.   Worked on isolating quadricep with specific exercises as noted weakness present.   PT Next Visit Plan Continue focus on ROM and quad strength.        Problem List Patient Active Problem List   Diagnosis Date Noted  . Abnormality of gait 03/15/2013  . Right knee pain 03/15/2013  . Total knee replacement status 02/27/2013    Lurena Nida, PTA/CLT [phone]  08/18/2014, 6:06 PM  [facility] [address] [place], [place], [address] Phone: [phone]   Fax:  [phone]      

## 2014-08-20 ENCOUNTER — Ambulatory Visit (HOSPITAL_COMMUNITY): Payer: BLUE CROSS/BLUE SHIELD

## 2014-08-20 DIAGNOSIS — M25662 Stiffness of left knee, not elsewhere classified: Secondary | ICD-10-CM

## 2014-08-20 DIAGNOSIS — Z96652 Presence of left artificial knee joint: Secondary | ICD-10-CM | POA: Diagnosis not present

## 2014-08-20 DIAGNOSIS — R29898 Other symptoms and signs involving the musculoskeletal system: Secondary | ICD-10-CM

## 2014-08-20 DIAGNOSIS — M25562 Pain in left knee: Secondary | ICD-10-CM

## 2014-08-20 DIAGNOSIS — R269 Unspecified abnormalities of gait and mobility: Secondary | ICD-10-CM

## 2014-08-20 DIAGNOSIS — M25561 Pain in right knee: Secondary | ICD-10-CM

## 2014-08-20 DIAGNOSIS — R2681 Unsteadiness on feet: Secondary | ICD-10-CM

## 2014-08-20 NOTE — Therapy (Signed)
[place], [place], [address] Phone: [phone]   Fax:  [phone]  Physical Therapy Treatment  Patient Details  Name: Connie Ross MRN: [id number] Date of Birth: Dec 15, 1955 Referring Provider:  Iona Beard, MD  Encounter Date: 08/20/2014      PT End of Session - 08/20/14 1716    Visit Number 7   Number of Visits 18   Date for PT Re-Evaluation 09/10/14   Authorization Type BCBS    Authorization Time Period 07/16/14 to 09/16/14   PT Start Time 1650   PT Stop Time 1738   PT Time Calculation (min) 48 min   Activity Tolerance Patient tolerated treatment well   Behavior During Therapy [facility] for tasks assessed/performed      Past Medical History  Diagnosis Date  . Hypertension   . GERD (gastroesophageal reflux disease)   . High cholesterol   . Type II diabetes mellitus   . Rheumatoid arthritis   . Anxiety     takes no meds  . DVT (deep venous thrombosis)     Past Surgical History  Procedure Laterality Date  . Total knee arthroplasty Right 02/28/2013  . Total knee arthroplasty Right 02/27/2013    Procedure: TOTAL KNEE ARTHROPLASTY- knee;  Surgeon: Newt Minion, MD;  Location: [place];  Service: Orthopedics;  Laterality: Right;  Right Total Knee Arthroplasty  . Eye surgery      bilateral cataracts  . Total knee arthroplasty Left 07/04/2014    Procedure: LEFT TOTAL KNEE ARTHROPLASTY;  Surgeon: Newt Minion, MD;  Location: [place];  Service: Orthopedics;  Laterality: Left;    There were no vitals filed for this visit.  Visit Diagnosis:  Status post total left knee replacement  Left knee pain  Weakness of both lower extremities  Unsteadiness  Abnormality of gait  Right knee pain  Knee stiffness, left      Subjective Assessment - 08/20/14 1656    Subjective Patient's grandson Engineer, maintenance (IT)) reports she's been working hard on improving her walking.  States she has minimal pain of 2/10 in her  LT knee.   Currently in Pain? Yes   Pain Score 2    Pain Location Knee   Pain Orientation Left             [facility] Adult PT Treatment/Exercise - 08/20/14 0001    Exercises   Exercises Knee/Hip   Knee/Hip Exercises: Stretches   Active Hamstring Stretch 30 seconds;Left;3 reps   Active Hamstring Stretch Limitations 12 inch step    Knee: Self-Stretch to increase Flexion Left;10 seconds   Knee: Self-Stretch Limitations 10 reps, 10 seconds 6 instep   Gastroc Stretch 3 reps;30 seconds   Gastroc Stretch Limitations slantboard    Knee/Hip Exercises: Aerobic   Stationary Bike full revolution seat 6 for ROM x 8 min   Knee/Hip Exercises: Standing   Heel Raises 15 reps   Heel Raises Limitations toeraises 15 reps   Knee Flexion 15 reps   Forward Lunges Both;10 reps   Forward Lunges Limitations onto floor with 1 HHA   Lateral Step Up Left;10 reps;Hand Hold: 1;Step Height: 2"   Forward Step Up Left;10 reps;Step Height: 4";Hand Hold: 1   Functional Squat Limitations 10 STS no HHA   Gait Training SBQC gait training x 300 feet with cueing for 2 point sequence   Knee/Hip Exercises: Seated   Sit to Sand 10 reps;without UE support   Knee/Hip Exercises: Supine   Short Arc  Quad Sets 15 reps   Heel Slides 15 reps   Heel Slides Limitations 3-115            PT Short Term Goals - 08/13/14 1702    PT SHORT TERM GOAL #1   Title Patient will demonstrate no more than 4 degrees L knee extension and at least 110 degrees L knee flexion    Baseline 8/10- 7 degrees extension, 113 flexion    Time 3   Period Weeks   Status Partially Met   PT SHORT TERM GOAL #2   Title Patient will demonstrate bilateral lower extremity strength of at least 4+/5 and proximal muscle strength of at least 4/5    Time 3   Period Weeks   Status On-going   PT SHORT TERM GOAL #3   Title Patient will be able to ambulate unlimited distances with single point cane, minimal unsteadiness, pain L knee no more than 2/10    Baseline 8/10- now using quad cane primarily inside, will use either two canes or walker outside    Time 3   Period Weeks   Status On-going   PT SHORT TERM GOAL #4   Title Patient will be independent in correctly and consistently performing appropriate HEP, to be updated PRN    Baseline 8/10- patient reports she is compliant with HEP    Time 3   Period Weeks   Status Achieved           PT Long Term Goals - 08/13/14 1706    PT LONG TERM GOAL #1   Title Patient will experience 0/10 pain in L knee during all functional standing and ambulation tasks    Baseline 8/10- not really hurting during the day, but is bothering her at night    Time 6   Period Weeks   Status Achieved   PT LONG TERM GOAL #2   Title Patient will be able to ambulate unlimited distances with no assistive device and L knee pain 0/10   Time 6   Period Weeks   Status On-going   PT LONG TERM GOAL #3   Title Patient will demonstrate L knee extension of 0 degrees and L knee flexion of at least 120 degrees    Baseline 8/10-  7 degrees extension, 113 degrees flexion    Time 6   Period Weeks   Status On-going   PT LONG TERM GOAL #4   Title Patient will be able to ascend/descend at least 6 stairs with no railing, step over step pattern, no circumduction, pain L knee 0/10, no unsteadiness    Time 6   Period Weeks   Status On-going               Plan - 08/20/14 1716    Clinical Impression Statement Pt improving sequence with [facility] with min cueing for proper techniques.  Session focus on improving ROM and functional strengthening.  Pt required therapist facilitation to complete all exercises correctly to reduce compensation with hip musculature and utilize correct musculature.  AROM is improving 3-115 degrees, pt does continue to demonstrate weak quadriceps and overall hip musculature.   PT Next Visit Plan Continue focus on ROM and quad strength.        Problem List Patient Active Problem List   Diagnosis  Date Noted  . Abnormality of gait 03/15/2013  . Right knee pain 03/15/2013  . Total knee replacement status 02/27/2013   Ihor Austin, LPTA; [facility] [phone]   Aldona Lento  08/20/2014, 5:32 PM  [place], [place], [address] Phone: [phone]   Fax:  [phone]

## 2014-08-22 ENCOUNTER — Ambulatory Visit (HOSPITAL_COMMUNITY): Payer: BLUE CROSS/BLUE SHIELD

## 2014-08-22 DIAGNOSIS — Z96652 Presence of left artificial knee joint: Secondary | ICD-10-CM

## 2014-08-22 DIAGNOSIS — R2681 Unsteadiness on feet: Secondary | ICD-10-CM

## 2014-08-22 DIAGNOSIS — M25561 Pain in right knee: Secondary | ICD-10-CM

## 2014-08-22 DIAGNOSIS — M25662 Stiffness of left knee, not elsewhere classified: Secondary | ICD-10-CM

## 2014-08-22 DIAGNOSIS — R269 Unspecified abnormalities of gait and mobility: Secondary | ICD-10-CM

## 2014-08-22 DIAGNOSIS — R29898 Other symptoms and signs involving the musculoskeletal system: Secondary | ICD-10-CM

## 2014-08-22 DIAGNOSIS — M25562 Pain in left knee: Secondary | ICD-10-CM

## 2014-08-22 NOTE — Therapy (Signed)
[place], [place], [address] Phone: [phone]   Fax:  [phone]  Physical Therapy Treatment  Patient Details  Name: Connie Ross MRN: [id number] Date of Birth: [birth year] Referring Provider:  Newt Minion, MD  Encounter Date: 08/22/2014      PT End of Session - 08/22/14 1705    Visit Number 8   Number of Visits 18   Date for PT Re-Evaluation 09/10/14   Authorization Type BCBS    Authorization Time Period 07/16/14 to 09/16/14   PT Start Time 1652   PT Stop Time 1730   PT Time Calculation (min) 38 min   Activity Tolerance Patient tolerated treatment well   Behavior During Therapy [facility] for tasks assessed/performed      Past Medical History  Diagnosis Date  . Hypertension   . GERD (gastroesophageal reflux disease)   . High cholesterol   . Type II diabetes mellitus   . Rheumatoid arthritis   . Anxiety     takes no meds  . DVT (deep venous thrombosis)     Past Surgical History  Procedure Laterality Date  . Total knee arthroplasty Right 02/28/2013  . Total knee arthroplasty Right 02/27/2013    Procedure: TOTAL KNEE ARTHROPLASTY- knee;  Surgeon: Newt Minion, MD;  Location: [place];  Service: Orthopedics;  Laterality: Right;  Right Total Knee Arthroplasty  . Eye surgery      bilateral cataracts  . Total knee arthroplasty Left 07/04/2014    Procedure: LEFT TOTAL KNEE ARTHROPLASTY;  Surgeon: Newt Minion, MD;  Location: [place];  Service: Orthopedics;  Laterality: Left;    There were no vitals filed for this visit.  Visit Diagnosis:  Status post total left knee replacement  Left knee pain  Weakness of both lower extremities  Unsteadiness  Abnormality of gait  Right knee pain  Knee stiffness, left      Subjective Assessment - 08/22/14 1702    Subjective Pt stated minimal pain today 2/10 Lt knee.  Patient's granddaughter translater for today's session.     Currently in Pain? Yes   Pain Score 2    Pain Location Knee   Pain Orientation Left   Pain Descriptors / Indicators Aching            [facility] Adult PT Treatment/Exercise - 08/22/14 0001    Exercises   Exercises Knee/Hip   Knee/Hip Exercises: Stretches   Active Hamstring Stretch 30 seconds;Left;3 reps   Active Hamstring Stretch Limitations 12 inch step    Knee: Self-Stretch to increase Flexion Left;10 seconds   Knee: Self-Stretch Limitations 10 reps, 10 seconds 6 instep   Gastroc Stretch 3 reps;30 seconds   Gastroc Stretch Limitations slantboard    Knee/Hip Exercises: Aerobic   Stationary Bike full revolution seat 6 for ROM x 8 min   Knee/Hip Exercises: Standing   Heel Raises 15 reps   Heel Raises Limitations toeraises 15 reps   Knee Flexion 15 reps   Lateral Step Up Left;15 reps;Hand Hold: 2;Step Height: 2"   Forward Step Up Left;15 reps;Hand Hold: 1;Step Height: 4"   Functional Squat 10 reps   Functional Squat Limitations cueing for weight loading   Knee/Hip Exercises: Supine   Short Arc Quad Sets 15 reps   Heel Slides 15 reps            PT Short Term Goals - 08/13/14 1702    PT SHORT TERM GOAL #1   Title Patient will  demonstrate no more than 4 degrees L knee extension and at least 110 degrees L knee flexion    Baseline 8/10- 7 degrees extension, 113 flexion    Time 3   Period Weeks   Status Partially Met   PT SHORT TERM GOAL #2   Title Patient will demonstrate bilateral lower extremity strength of at least 4+/5 and proximal muscle strength of at least 4/5    Time 3   Period Weeks   Status On-going   PT SHORT TERM GOAL #3   Title Patient will be able to ambulate unlimited distances with single point cane, minimal unsteadiness, pain L knee no more than 2/10   Baseline 8/10- now using quad cane primarily inside, will use either two canes or walker outside    Time 3   Period Weeks   Status On-going   PT SHORT TERM GOAL #4   Title Patient will be independent in correctly and consistently  performing appropriate HEP, to be updated PRN    Baseline 8/10- patient reports she is compliant with HEP    Time 3   Period Weeks   Status Achieved           PT Long Term Goals - 08/13/14 1706    PT LONG TERM GOAL #1   Title Patient will experience 0/10 pain in L knee during all functional standing and ambulation tasks    Baseline 8/10- not really hurting during the day, but is bothering her at night    Time 6   Period Weeks   Status Achieved   PT LONG TERM GOAL #2   Title Patient will be able to ambulate unlimited distances with no assistive device and L knee pain 0/10   Time 6   Period Weeks   Status On-going   PT LONG TERM GOAL #3   Title Patient will demonstrate L knee extension of 0 degrees and L knee flexion of at least 120 degrees    Baseline 8/10-  7 degrees extension, 113 degrees flexion    Time 6   Period Weeks   Status On-going   PT LONG TERM GOAL #4   Title Patient will be able to ascend/descend at least 6 stairs with no railing, step over step pattern, no circumduction, pain L knee 0/10, no unsteadiness    Time 6   Period Weeks   Status On-going               Plan - 08/22/14 1829    Clinical Impression Statement Pt continues to require cueing to place all quad cane down at once with gait, improving sequence and overall gait mechanics.  Pt encouraged to look into getting a SPC to improve gait mechanics.  Session focus on functional strengthening primarly gluteal and quadriceps and ROM.  Therapist facilitaiton to complete all exercises with proper form and to reduce compensation with hip musculature with lateral step ups with tacile and verbal cueing as well as demonstration.  No reports of pain at end of session.     PT Next Visit Plan Continue focus on ROM and quad strength.        Problem List Patient Active Problem List   Diagnosis Date Noted  . Abnormality of gait 03/15/2013  . Right knee pain 03/15/2013  . Total knee replacement status  02/27/2013   Ihor Austin, LPTA; [place]  Aldona Lento 08/22/2014, 6:35 PM  [place] [address] [place], [place], [address]  Phone: [phone]   Fax:  [phone]

## 2014-08-25 ENCOUNTER — Ambulatory Visit (HOSPITAL_COMMUNITY): Payer: BLUE CROSS/BLUE SHIELD | Admitting: Physical Therapy

## 2014-08-25 DIAGNOSIS — M25562 Pain in left knee: Secondary | ICD-10-CM

## 2014-08-25 DIAGNOSIS — Z96652 Presence of left artificial knee joint: Secondary | ICD-10-CM

## 2014-08-25 DIAGNOSIS — R29898 Other symptoms and signs involving the musculoskeletal system: Secondary | ICD-10-CM

## 2014-08-25 DIAGNOSIS — R269 Unspecified abnormalities of gait and mobility: Secondary | ICD-10-CM

## 2014-08-25 DIAGNOSIS — R2681 Unsteadiness on feet: Secondary | ICD-10-CM

## 2014-08-25 NOTE — Therapy (Signed)
[place], [place], [address] Phone: [phone]   Fax:  [phone]  Physical Therapy Treatment  Patient Details  Name: Connie Ross MRN: [id number] Date of Birth: [birth year] Referring Provider:  Newt Minion, MD  Encounter Date: 08/25/2014      PT End of Session - 08/25/14 1722    Visit Number 9   Number of Visits 18   Date for PT Re-Evaluation 09/10/14   Authorization Type BCBS    Authorization Time Period 07/16/14 to 09/16/14   PT Start Time 1650   PT Stop Time 1732   PT Time Calculation (min) 42 min   Activity Tolerance Patient tolerated treatment well   Behavior During Therapy [facility] for tasks assessed/performed      Past Medical History  Diagnosis Date  . Hypertension   . GERD (gastroesophageal reflux disease)   . High cholesterol   . Type II diabetes mellitus   . Rheumatoid arthritis   . Anxiety     takes no meds  . DVT (deep venous thrombosis)     Past Surgical History  Procedure Laterality Date  . Total knee arthroplasty Right 02/28/2013  . Total knee arthroplasty Right 02/27/2013    Procedure: TOTAL KNEE ARTHROPLASTY- knee;  Surgeon: Newt Minion, MD;  Location: [place];  Service: Orthopedics;  Laterality: Right;  Right Total Knee Arthroplasty  . Eye surgery      bilateral cataracts  . Total knee arthroplasty Left 07/04/2014    Procedure: LEFT TOTAL KNEE ARTHROPLASTY;  Surgeon: Newt Minion, MD;  Location: [place];  Service: Orthopedics;  Laterality: Left;    There were no vitals filed for this visit.  Visit Diagnosis:  Status post total left knee replacement  Left knee pain  Weakness of both lower extremities  Unsteadiness  Abnormality of gait      Subjective Assessment - 08/25/14 1651    Subjective Patient presents with family and some pain L knee, around 4/10. Grandson acted as Optometrist today.    Pertinent History Patient had her knee replaced on July 1st; did not  recieve HHPT. Things have been going well at home- patient has had the other knee replaced. Patient is currently taking medication for DVT.    Currently in Pain? Yes   Pain Score 4    Pain Location Knee   Pain Orientation Left                         [facility] Adult PT Treatment/Exercise - 08/25/14 0001    Ambulation/Gait   Ambulation/Gait Yes   Ambulation/Gait Assistance 6: Modified independent (Device/Increase time)   Assistive device Straight cane   Gait Comments ambulation with single point cane x232f, occasional cues for sequencing but otherwise independent    Knee/Hip Exercises: Stretches   Active Hamstring Stretch Both;3 reps;30 seconds   Active Hamstring Stretch Limitations 12 inch box    Knee: Self-Stretch to increase Flexion Left   Knee: Self-Stretch Limitations 10 reps, 10 seconds 12 inch box    Gastroc Stretch 3 reps;30 seconds   Gastroc Stretch Limitations slantboard    Knee/Hip Exercises: Aerobic   Stationary Bike full revolution seat 6 for ROM x 10 min   Knee/Hip Exercises: Standing   Heel Raises Both;20 reps   Heel Raises Limitations heel raises    Forward Lunges Both;1 set;10 reps   Forward Lunges Limitations onto floor    Forward Step Up Both;1 set;15  reps   Forward Step Up Limitations 4 inch step    Step Down Both;1 set;10 reps   Step Down Limitations 4 inch box    Functional Squat 10 reps   Functional Squat Limitations cueing for weight loading   Other Standing Knee Exercises 3D hip excursions 1x10                PT Education - 08/25/14 1721    Education provided Yes   Education Details may not need full quad cane since she regularly does not put all 4 tips on floor, can likely get SPC from drug store    Person(s) Educated Patient   Methods Explanation   Comprehension Verbalized understanding          PT Short Term Goals - 08/13/14 1702    PT SHORT TERM GOAL #1   Title Patient will demonstrate no more than 4 degrees L knee  extension and at least 110 degrees L knee flexion    Baseline 8/10- 7 degrees extension, 113 flexion    Time 3   Period Weeks   Status Partially Met   PT SHORT TERM GOAL #2   Title Patient will demonstrate bilateral lower extremity strength of at least 4+/5 and proximal muscle strength of at least 4/5    Time 3   Period Weeks   Status On-going   PT SHORT TERM GOAL #3   Title Patient will be able to ambulate unlimited distances with single point cane, minimal unsteadiness, pain L knee no more than 2/10   Baseline 8/10- now using quad cane primarily inside, will use either two canes or walker outside    Time 3   Period Weeks   Status On-going   PT SHORT TERM GOAL #4   Title Patient will be independent in correctly and consistently performing appropriate HEP, to be updated PRN    Baseline 8/10- patient reports she is compliant with HEP    Time 3   Period Weeks   Status Achieved           PT Long Term Goals - 08/13/14 1706    PT LONG TERM GOAL #1   Title Patient will experience 0/10 pain in L knee during all functional standing and ambulation tasks    Baseline 8/10- not really hurting during the day, but is bothering her at night    Time 6   Period Weeks   Status Achieved   PT LONG TERM GOAL #2   Title Patient will be able to ambulate unlimited distances with no assistive device and L knee pain 0/10   Time 6   Period Weeks   Status On-going   PT LONG TERM GOAL #3   Title Patient will demonstrate L knee extension of 0 degrees and L knee flexion of at least 120 degrees    Baseline 8/10-  7 degrees extension, 113 degrees flexion    Time 6   Period Weeks   Status On-going   PT LONG TERM GOAL #4   Title Patient will be able to ascend/descend at least 6 stairs with no railing, step over step pattern, no circumduction, pain L knee 0/10, no unsteadiness    Time 6   Period Weeks   Status On-going               Plan - 08/25/14 1722    Clinical Impression Statement  Continued functional exercises and stretching today, also introduced single point cane as patient is not in  need of quad cane for stability at this piont. Patient performed all exercises well today but did require cues for form of new exercises. Tolerated single point cane well with good sequencing and no unsteadiness.    Pt will benefit from skilled therapeutic intervention in order to improve on the following deficits Abnormal gait;Decreased coordination;Decreased range of motion;Difficulty walking;Decreased endurance;Obesity;Decreased activity tolerance;Pain;Decreased balance;Decreased scar mobility;Hypomobility;Impaired flexibility;Improper body mechanics;Decreased mobility;Decreased strength;Increased edema;Postural dysfunction   Rehab Potential Good   PT Frequency 3x / week   PT Duration 6 weeks   PT Treatment/Interventions ADLs/Self Care Home Management;Cryotherapy;Gait training;Stair training;DME Instruction;Functional mobility training;Therapeutic activities;Therapeutic exercise;Balance training;Neuromuscular re-education;Patient/family education;Manual techniques;Passive range of motion   PT Next Visit Plan Continue focus on ROM and quad strength.   PT Home Exercise Plan given    Consulted and Agree with Plan of Care Patient        Problem List Patient Active Problem List   Diagnosis Date Noted  . Abnormality of gait 03/15/2013  . Right knee pain 03/15/2013  . Total knee replacement status 02/27/2013    Deniece Ree PT, DPT [place] [address] [place], [place], [address] Phone: [phone]   Fax:  [phone]

## 2014-08-27 ENCOUNTER — Ambulatory Visit (HOSPITAL_COMMUNITY): Payer: BLUE CROSS/BLUE SHIELD | Admitting: Physical Therapy

## 2014-08-27 DIAGNOSIS — R29898 Other symptoms and signs involving the musculoskeletal system: Secondary | ICD-10-CM

## 2014-08-27 DIAGNOSIS — Z96652 Presence of left artificial knee joint: Secondary | ICD-10-CM | POA: Diagnosis not present

## 2014-08-27 DIAGNOSIS — R269 Unspecified abnormalities of gait and mobility: Secondary | ICD-10-CM

## 2014-08-27 DIAGNOSIS — M25662 Stiffness of left knee, not elsewhere classified: Secondary | ICD-10-CM

## 2014-08-27 DIAGNOSIS — R2681 Unsteadiness on feet: Secondary | ICD-10-CM

## 2014-08-27 DIAGNOSIS — M25562 Pain in left knee: Secondary | ICD-10-CM

## 2014-08-27 NOTE — Therapy (Signed)
[facility] [address] [place], [place], [address] Phone: [phone]   Fax:  [phone]  Physical Therapy Treatment  Patient Details  Name: Connie Ross MRN: [id number] Date of Birth: 12-07-1955 Referring Provider:  Mirna Mires, MD  Encounter Date: 08/27/2014      PT End of Session - 08/27/14 1724    Visit Number 10   Number of Visits 15   Date for PT Re-Evaluation 09/10/14   Authorization Type BCBS    Authorization Time Period 07/16/14 to 09/16/14   PT Start Time 1650   PT Stop Time 1730   PT Time Calculation (min) 40 min   Activity Tolerance Patient tolerated treatment well   Behavior During Therapy [facility] for tasks assessed/performed      Past Medical History  Diagnosis Date  . Hypertension   . GERD (gastroesophageal reflux disease)   . High cholesterol   . Type II diabetes mellitus   . Rheumatoid arthritis   . Anxiety     takes no meds  . DVT (deep venous thrombosis)     Past Surgical History  Procedure Laterality Date  . Total knee arthroplasty Right 02/28/2013  . Total knee arthroplasty Right 02/27/2013    Procedure: TOTAL KNEE ARTHROPLASTY- knee;  Surgeon: Nadara Mustard, MD;  Location: [facility] OR;  Service: Orthopedics;  Laterality: Right;  Right Total Knee Arthroplasty  . Eye surgery      bilateral cataracts  . Total knee arthroplasty Left 07/04/2014    Procedure: LEFT TOTAL KNEE ARTHROPLASTY;  Surgeon: Nadara Mustard, MD;  Location: [facility] OR;  Service: Orthopedics;  Laterality: Left;    There were no vitals filed for this visit.  Visit Diagnosis:  Status post total left knee replacement  Left knee pain  Weakness of both lower extremities  Unsteadiness  Abnormality of gait  Knee stiffness, left      Subjective Assessment - 08/27/14 1652    Subjective Patient presents with family for translation and only mild pain L knee. Doing well today.    Pertinent History Patient had her knee replaced on July  1st; did not recieve HHPT. Things have been going well at home- patient has had the other knee replaced. Patient is currently taking medication for DVT.    Currently in Pain? Yes   Pain Score 2    Pain Location Knee   Pain Orientation Left            [facility] PT Assessment - 08/27/14 0001    AROM   Left Knee Extension 2   Left Knee Flexion 110                     [facility] Adult PT Treatment/Exercise - 08/27/14 0001    Ambulation/Gait   Stairs Yes   Stairs Assistance 6: Modified independent (Device/Increase time)   Stair Management Technique Two rails;Alternating pattern   Number of Stairs 7   Height of Stairs 4   Gait Comments able to navigate 4 inch steps with reciprocal pattern but difficulty with knee stability; had a harder time performing same task on 7 inch steps    Knee/Hip Exercises: Stretches   Active Hamstring Stretch Both;3 reps;30 seconds   Active Hamstring Stretch Limitations 12 inch box    Quad Stretch Left;2 reps;30 seconds   Knee: Self-Stretch to increase Flexion Left   Knee: Self-Stretch Limitations 10 reps, 10 seconds 12 inch box    Gastroc Stretch 3 reps;30 seconds  Gastroc Stretch Limitations slantboard    Knee/Hip Exercises: Aerobic   Stationary Bike full revolution seat 6 for ROM x 10 min   Knee/Hip Exercises: Standing   Step Down --   Step Down Limitations --   Rocker Board Limitations x20AP, x20 lateral U HHA                 PT Education - 08/27/14 1724    Education provided Yes   Education Details doing well overall, likely will only need 4-5 more sessions with focus on stairs, knee stability, some ROM    Person(s) Educated Patient   Methods Explanation   Comprehension Verbalized understanding          PT Short Term Goals - 08/27/14 1710    PT SHORT TERM GOAL #1   Title Patient will demonstrate no more than 4 degrees L knee extension and at least 110 degrees L knee flexion    Period Weeks   Status Achieved   PT SHORT  TERM GOAL #2   Title Patient will demonstrate bilateral lower extremity strength of at least 4+/5 and proximal muscle strength of at least 4/5    Time 3   Period Weeks   Status On-going   PT SHORT TERM GOAL #3   Title Patient will be able to ambulate unlimited distances with single point cane, minimal unsteadiness, pain L knee no more than 2/10   Time 3   Period Weeks   Status On-going   PT SHORT TERM GOAL #4   Title Patient will be independent in correctly and consistently performing appropriate HEP, to be updated PRN    Time 3   Period Weeks   Status Achieved           PT Long Term Goals - 08/27/14 1712    PT LONG TERM GOAL #1   Title Patient will experience 0/10 pain in L knee during all functional standing and ambulation tasks    Time 6   Period Weeks   Status Achieved   PT LONG TERM GOAL #2   Title Patient will be able to ambulate unlimited distances with no assistive device and L knee pain 0/10   Baseline 8/24- fatiuge limits her more than pain    Time 6   Period Weeks   Status On-going   PT LONG TERM GOAL #3   Title Patient will demonstrate L knee extension of 0 degrees and L knee flexion of at least 120 degrees    Time 6   Period Weeks   Status On-going   PT LONG TERM GOAL #4   Title Patient will be able to ascend/descend at least 6 stairs with no railing, step over step pattern, no circumduction, pain L knee 0/10, no unsteadiness    Baseline 8/24- difficulty with stairs and reciprocal pattern, knee instability    Time 6   Period Weeks   Status On-going               Plan - 08/27/14 1724    Clinical Impression Statement Performed functional stretching today and also introduced stair training. Patient does have difficulty on stairs possibly paritally due to remaiing ROM deficits and knee stability/muscular control. Performed quick assessment of progress towards goals; at this time patient is doing well and will likely only need 4-5 more sessions focusing  on stairs, knee strength/stability, ROM as she reports she is able to do everything she wants and needs at home without difficulty.    Pt  will benefit from skilled therapeutic intervention in order to improve on the following deficits Abnormal gait;Decreased coordination;Decreased range of motion;Difficulty walking;Decreased endurance;Obesity;Decreased activity tolerance;Pain;Decreased balance;Decreased scar mobility;Hypomobility;Impaired flexibility;Improper body mechanics;Decreased mobility;Decreased strength;Increased edema;Postural dysfunction   Rehab Potential Good   PT Frequency 3x / week   PT Duration 6 weeks   PT Treatment/Interventions ADLs/Self Care Home Management;Cryotherapy;Gait training;Stair training;DME Instruction;Functional mobility training;Therapeutic activities;Therapeutic exercise;Balance training;Neuromuscular re-education;Patient/family education;Manual techniques;Passive range of motion   PT Next Visit Plan Focus on knee strength/stability, ROM, stairs; possible DC in 4-5 sessions    PT Home Exercise Plan given    Consulted and Agree with Plan of Care Patient        Problem List Patient Active Problem List   Diagnosis Date Noted  . Abnormality of gait 03/15/2013  . Right knee pain 03/15/2013  . Total knee replacement status 02/27/2013    Nedra Hai PT, DPT [phone]  [facility] [address] [place], [place], [address] Phone: [phone]   Fax:  [phone]

## 2014-08-29 ENCOUNTER — Ambulatory Visit (HOSPITAL_COMMUNITY): Payer: BLUE CROSS/BLUE SHIELD

## 2014-09-02 ENCOUNTER — Encounter (HOSPITAL_COMMUNITY): Payer: BLUE CROSS/BLUE SHIELD | Admitting: Physical Therapy

## 2014-09-03 ENCOUNTER — Encounter (HOSPITAL_COMMUNITY): Payer: BLUE CROSS/BLUE SHIELD | Admitting: Physical Therapy

## 2014-09-05 ENCOUNTER — Encounter (HOSPITAL_COMMUNITY): Payer: BLUE CROSS/BLUE SHIELD | Admitting: Physical Therapy

## 2014-09-09 ENCOUNTER — Encounter (HOSPITAL_COMMUNITY): Payer: BLUE CROSS/BLUE SHIELD | Admitting: Physical Therapy

## 2014-09-10 ENCOUNTER — Ambulatory Visit (HOSPITAL_COMMUNITY): Payer: BLUE CROSS/BLUE SHIELD | Admitting: Physical Therapy

## 2014-09-12 ENCOUNTER — Ambulatory Visit (HOSPITAL_COMMUNITY): Payer: BLUE CROSS/BLUE SHIELD | Attending: Orthopedic Surgery

## 2014-09-12 DIAGNOSIS — R29898 Other symptoms and signs involving the musculoskeletal system: Secondary | ICD-10-CM | POA: Insufficient documentation

## 2014-09-12 DIAGNOSIS — M25562 Pain in left knee: Secondary | ICD-10-CM | POA: Diagnosis present

## 2014-09-12 DIAGNOSIS — R2681 Unsteadiness on feet: Secondary | ICD-10-CM | POA: Diagnosis present

## 2014-09-12 DIAGNOSIS — M25662 Stiffness of left knee, not elsewhere classified: Secondary | ICD-10-CM | POA: Insufficient documentation

## 2014-09-12 DIAGNOSIS — Z96652 Presence of left artificial knee joint: Secondary | ICD-10-CM | POA: Diagnosis present

## 2014-09-12 DIAGNOSIS — M25561 Pain in right knee: Secondary | ICD-10-CM | POA: Insufficient documentation

## 2014-09-12 DIAGNOSIS — R269 Unspecified abnormalities of gait and mobility: Secondary | ICD-10-CM | POA: Diagnosis present

## 2014-09-12 NOTE — Therapy (Signed)
[facility] [address] [place], [place], [address] Phone: [phone]   Fax:  [phone]  Physical Therapy Treatment (Re-Assessment)  Patient Details  Name: Connie Ross MRN: [id number] Date of Birth: Sep 08, 1955 Referring Provider:  Mirna Mires, MD  Encounter Date: 09/12/2014      PT End of Session - 09/12/14 1747    Visit Number 11   Number of Visits 15   Date for PT Re-Evaluation 09/26/14   Authorization Type BCBS    Authorization Time Period 07/16/14 to 09/16/14   PT Start Time 1740   PT Stop Time 1835   PT Time Calculation (min) 55 min   Activity Tolerance Patient tolerated treatment well   Behavior During Therapy [facility] for tasks assessed/performed      Past Medical History  Diagnosis Date  . Hypertension   . GERD (gastroesophageal reflux disease)   . High cholesterol   . Type II diabetes mellitus   . Rheumatoid arthritis   . Anxiety     takes no meds  . DVT (deep venous thrombosis)     Past Surgical History  Procedure Laterality Date  . Total knee arthroplasty Right 02/28/2013  . Total knee arthroplasty Right 02/27/2013    Procedure: TOTAL KNEE ARTHROPLASTY- knee;  Surgeon: Nadara Mustard, MD;  Location: [facility] OR;  Service: Orthopedics;  Laterality: Right;  Right Total Knee Arthroplasty  . Eye surgery      bilateral cataracts  . Total knee arthroplasty Left 07/04/2014    Procedure: LEFT TOTAL KNEE ARTHROPLASTY;  Surgeon: Nadara Mustard, MD;  Location: [facility] OR;  Service: Orthopedics;  Laterality: Left;    There were no vitals filed for this visit.  Visit Diagnosis:  Status post total left knee replacement  Left knee pain  Weakness of both lower extremities  Unsteadiness  Abnormality of gait  Knee stiffness, left      Subjective Assessment - 09/12/14 1742    Subjective Pt present with daugther as translater today, stated knee is feeling good.  Reports Lt foot sore today,   Daughter reports mother can  sleep better for long periods of time.     How long can you sit comfortably? 09/12/2014:  Able to sit for long periods of time confortably with no issues   How long can you stand comfortably? 09/12/2014 Able to stand confortably for3-4 hours8/10- maybe a couple of hours, "good long time but not that long"    How long can you walk comfortably? 09/12/2014: Able to walk for long periods of time with and without cane (8/10- walking is still going well )   Currently in Pain? Yes   Pain Score 3    Pain Location Foot   Pain Orientation Left   Pain Descriptors / Indicators Sore            [facility] PT Assessment - 09/12/14 0001    Assessment   Medical Diagnosis s/p L TKA    Onset Date/Surgical Date 07/04/14   Next MD Visit Unscheduled   AROM   Left Knee Extension 2  was 2   Left Knee Flexion 115  was 110   Strength   Right Hip Flexion 3+/5  was 3+/5   Right Hip Extension 4/5   Right Hip ABduction 4/5  was 3-/5   Left Hip Flexion 4-/5  was 3+/5   Left Hip Extension 3+/5   Left Hip ABduction 4/5  was 3/5   Right Knee Flexion 4+/5  was 4-/5  Right Knee Extension 5/5   Left Knee Flexion 4-/5  was 2+/5   Left Knee Extension --  was 4-/5   Right Ankle Dorsiflexion 5/5   Left Ankle Dorsiflexion --  was 4/5   Standardized Balance Assessment   Standardized Balance Assessment Timed Up and Go Test;Five Times Sit to Stand   Five times sit to stand comments  9.91 no HHA   Timed Up and Go Test   TUG Normal TUG   Normal TUG (seconds) 14.5  no AD           [facility] Adult PT Treatment/Exercise - 09/12/14 0001    Knee/Hip Exercises: Stretches   Active Hamstring Stretch Both;3 reps;30 seconds   Active Hamstring Stretch Limitations 12 inch box    Quad Stretch 3 reps;30 seconds   Knee: Self-Stretch to increase Flexion Left   Knee: Self-Stretch Limitations 10 reps, 10 seconds 12 inch box    Gastroc Stretch 3 reps;30 seconds   Gastroc Stretch Limitations slantboard    Knee/Hip  Exercises: Aerobic   Stationary Bike full revolution seat 5 for ROM x 8 min   Knee/Hip Exercises: Standing   Lateral Step Up Left;15 reps;Hand Hold: 2;Step Height: 4"   Functional Squat 10 reps   Functional Squat Limitations cueing for weight loading   Stairs 3RT reciprocal pattern 1HR ascend and descending   Knee/Hip Exercises: Supine   Heel Slides 15 reps   Heel Slides Limitations 2-115            PT Short Term Goals - 09/12/14 1748    PT SHORT TERM GOAL #1   Title Patient will demonstrate no more than 4 degrees L knee extension and at least 110 degrees L knee flexion    Status Achieved   PT SHORT TERM GOAL #2   Title Patient will demonstrate bilateral lower extremity strength of at least 4+/5 and proximal muscle strength of at least 4/5    Status On-going   PT SHORT TERM GOAL #3   Title Patient will be able to ambulate unlimited distances with single point cane, minimal unsteadiness, pain L knee no more than 2/10   Baseline 09/12/2014:  Ambulates with SPC with no reports of pain or unsteadiness   Status Achieved   PT SHORT TERM GOAL #4   Title Patient will be independent in correctly and consistently performing appropriate HEP, to be updated PRN    Status Achieved           PT Long Term Goals - 09/12/14 1750    PT LONG TERM GOAL #1   Title Patient will experience 0/10 pain in L knee during all functional standing and ambulation tasks    Status Achieved   PT LONG TERM GOAL #2   Title Patient will be able to ambulate unlimited distances with no assistive device and L knee pain 0/10   Baseline 09/12/2014:  Ambulates with no AD indoors and for 10 minutes intervals without AD outside   Status On-going   PT LONG TERM GOAL #3   Title Patient will demonstrate L knee extension of 0 degrees and L knee flexion of at least 120 degrees    Baseline 09/12/2014   PT LONG TERM GOAL #4   Title Patient will be able to ascend/descend at least 6 stairs with no railing, step over step  pattern, no circumduction, pain L knee 0/10, no unsteadiness                Plan - 09/12/14  1837    Clinical Impression Statement Reassessment complete wtih the following findings:  Pt.'s daughter acted as the interpreter this session.  Reports compliance with HEP daily and is now ambulating with no AD indoors and uses SPC outdoors though can ambulate with no AD for 10 minutes prior fatigue.  AROM improving to 2-115 degrees though is impaired with flexion and functional tasks including descending stairs.  Therapist facilitation to reduce sidestepping down stairs.  Strength is improving though does continue to show weakness especially gluts and hip musculature.  Pt will continue to benefit from skilled intervention for 4 more sessions to improve AROM, functional strenghtening and gait mechanics with no AD.     PT Next Visit Plan Recommend continuing for 4 more session per PT plan last session: Focus on knee strength/stability, ROM, stairs;        Problem List Patient Active Problem List   Diagnosis Date Noted  . Abnormality of gait 03/15/2013  . Right knee pain 03/15/2013  . Total knee replacement status 02/27/2013   Becky Sax, LPTA; CBIS [phone]  Juel Burrow 09/12/2014, 6:46 PM   Physical Therapy Progress Note  Dates of Reporting Period: 08/13/14 to 09/15/14  Objective Reports of Subjective Statement: see above   Objective Measurements: see above   Goal Update: see above   Plan: see above   Reason Skilled Services are Required: AROM, functional strength, gait/stairs training, development of advanced HEP for use after DC   Nedra Hai PT, DPT [phone]     [facility] [address] [place], [place], [address] Phone: [phone]   Fax:  [phone]

## 2014-09-15 ENCOUNTER — Ambulatory Visit (HOSPITAL_COMMUNITY): Payer: BLUE CROSS/BLUE SHIELD

## 2014-09-15 DIAGNOSIS — R2681 Unsteadiness on feet: Secondary | ICD-10-CM

## 2014-09-15 DIAGNOSIS — M25561 Pain in right knee: Secondary | ICD-10-CM

## 2014-09-15 DIAGNOSIS — Z96652 Presence of left artificial knee joint: Secondary | ICD-10-CM | POA: Diagnosis not present

## 2014-09-15 DIAGNOSIS — M25562 Pain in left knee: Secondary | ICD-10-CM

## 2014-09-15 DIAGNOSIS — R29898 Other symptoms and signs involving the musculoskeletal system: Secondary | ICD-10-CM

## 2014-09-15 DIAGNOSIS — M25662 Stiffness of left knee, not elsewhere classified: Secondary | ICD-10-CM

## 2014-09-15 DIAGNOSIS — R269 Unspecified abnormalities of gait and mobility: Secondary | ICD-10-CM

## 2014-09-15 NOTE — Therapy (Signed)
[facility] [address] [place], [place], [address] Phone: [phone]   Fax:  [phone]  Physical Therapy Treatment  Patient Details  Name: Connie Ross MRN: [id number] Date of Birth: 21-Mar-1955 Referring Provider:  Mirna Mires, MD  Encounter Date: 09/15/2014      PT End of Session - 09/15/14 1656    Visit Number 12   Number of Visits 15   Date for PT Re-Evaluation 09/26/14   Authorization Type BCBS    Authorization Time Period 07/16/14 to 09/16/14   PT Start Time 1646   PT Stop Time 1735   PT Time Calculation (min) 49 min   Activity Tolerance Patient tolerated treatment well   Behavior During Therapy [facility] for tasks assessed/performed      Past Medical History  Diagnosis Date  . Hypertension   . GERD (gastroesophageal reflux disease)   . High cholesterol   . Type II diabetes mellitus   . Rheumatoid arthritis   . Anxiety     takes no meds  . DVT (deep venous thrombosis)     Past Surgical History  Procedure Laterality Date  . Total knee arthroplasty Right 02/28/2013  . Total knee arthroplasty Right 02/27/2013    Procedure: TOTAL KNEE ARTHROPLASTY- knee;  Surgeon: Nadara Mustard, MD;  Location: [facility] OR;  Service: Orthopedics;  Laterality: Right;  Right Total Knee Arthroplasty  . Eye surgery      bilateral cataracts  . Total knee arthroplasty Left 07/04/2014    Procedure: LEFT TOTAL KNEE ARTHROPLASTY;  Surgeon: Nadara Mustard, MD;  Location: [facility] OR;  Service: Orthopedics;  Laterality: Left;    There were no vitals filed for this visit.  Visit Diagnosis:  Status post total left knee replacement  Left knee pain  Weakness of both lower extremities  Unsteadiness  Abnormality of gait  Knee stiffness, left  Right knee pain      Subjective Assessment - 09/15/14 1655    Subjective Pt stated knee is feeling good today, pain scale 2-3/10.   Currently in Pain? Yes   Pain Score 3    Pain Location Knee   Pain  Orientation Left   Pain Descriptors / Indicators Sore             [facility] Adult PT Treatment/Exercise - 09/15/14 0001    Exercises   Exercises Knee/Hip   Knee/Hip Exercises: Stretches   Quad Stretch 3 reps;30 seconds   Quad Stretch Limitations prone with rope   Knee: Self-Stretch to increase Flexion Left   Knee: Self-Stretch Limitations 15 reps, 10 seconds 6 inch box    Gastroc Stretch 3 reps;30 seconds   Gastroc Stretch Limitations slantboard    Knee/Hip Exercises: Aerobic   Stationary Bike full revolution seat 5 for ROM x 8 min   Knee/Hip Exercises: Standing   Forward Lunges Both;1 set;10 reps   Forward Lunges Limitations onto floor    Lateral Step Up Left;15 reps;Hand Hold: 2;Step Height: 4"   Functional Squat 10 reps   Functional Squat Limitations cueing for weight loading   Stairs 5RT 1HR ascend and descending 4in step   Other Standing Knee Exercises 6in hurdles alternating 2RT           PT Short Term Goals - 09/12/14 1748    PT SHORT TERM GOAL #1   Title Patient will demonstrate no more than 4 degrees L knee extension and at least 110 degrees L knee flexion    Status Achieved  PT SHORT TERM GOAL #2   Title Patient will demonstrate bilateral lower extremity strength of at least 4+/5 and proximal muscle strength of at least 4/5    Status On-going   PT SHORT TERM GOAL #3   Title Patient will be able to ambulate unlimited distances with single point cane, minimal unsteadiness, pain L knee no more than 2/10   Baseline 09/12/2014:  Ambulates with SPC with no reports of pain or unsteadiness   Status Achieved   PT SHORT TERM GOAL #4   Title Patient will be independent in correctly and consistently performing appropriate HEP, to be updated PRN    Status Achieved           PT Long Term Goals - 09/12/14 1750    PT LONG TERM GOAL #1   Title Patient will experience 0/10 pain in L knee during all functional standing and ambulation tasks    Status Achieved   PT LONG  TERM GOAL #2   Title Patient will be able to ambulate unlimited distances with no assistive device and L knee pain 0/10   Baseline 09/12/2014:  Ambulates with no AD indoors and for 10 minutes intervals without AD outside   Status On-going   PT LONG TERM GOAL #3   Title Patient will demonstrate L knee extension of 0 degrees and L knee flexion of at least 120 degrees    Baseline 09/12/2014   PT LONG TERM GOAL #4   Title Patient will be able to ascend/descend at least 6 stairs with no railing, step over step pattern, no circumduction, pain L knee 0/10, no unsteadiness                Plan - 09/15/14 1728    Clinical Impression Statement Session focus on improving knee AROM, overall knee stabiltiy and functional strengthening.  Added 6in hurdles step over to improve balance and hip flexion strengtheing with 5" holds for knee stability.  Pt improved eccentic control descending stairs this session, continues to require cueing to reduce sidestepping down stairs.  Pt.'s daughter interpreter stated pt. has been complinaing of Rt ankle pain, noted increased pronation with stance phase.  Pt given lift of orthortist stores to have good supportive shoes to assist with excessive pronation with gait.  No reports of increased pain through session.     PT Next Visit Plan Continue PT POC to improve ROM, functional strengthening with stairs, knee stabilty and gait training with no AD.          Problem List Patient Active Problem List   Diagnosis Date Noted  . Abnormality of gait 03/15/2013  . Right knee pain 03/15/2013  . Total knee replacement status 02/27/2013   Becky Sax, LPTA; CBIS [phone]  Juel Burrow 09/15/2014, 5:43 PM  [facility] [address] [place], [place], [address] Phone: [phone]   Fax:  [phone]

## 2014-09-15 NOTE — Addendum Note (Signed)
Addended by: Milinda Pointer on: 09/15/2014 06:05 PM   Modules accepted: Orders

## 2014-09-17 ENCOUNTER — Encounter (HOSPITAL_COMMUNITY): Payer: BLUE CROSS/BLUE SHIELD | Admitting: Physical Therapy

## 2014-09-19 ENCOUNTER — Ambulatory Visit (HOSPITAL_COMMUNITY): Payer: BLUE CROSS/BLUE SHIELD | Admitting: Physical Therapy

## 2014-09-23 ENCOUNTER — Ambulatory Visit (HOSPITAL_COMMUNITY): Payer: BLUE CROSS/BLUE SHIELD | Admitting: Physical Therapy

## 2014-09-23 DIAGNOSIS — M25561 Pain in right knee: Secondary | ICD-10-CM

## 2014-09-23 DIAGNOSIS — Z96652 Presence of left artificial knee joint: Secondary | ICD-10-CM | POA: Diagnosis not present

## 2014-09-23 DIAGNOSIS — R2681 Unsteadiness on feet: Secondary | ICD-10-CM

## 2014-09-23 DIAGNOSIS — R269 Unspecified abnormalities of gait and mobility: Secondary | ICD-10-CM

## 2014-09-23 DIAGNOSIS — M25662 Stiffness of left knee, not elsewhere classified: Secondary | ICD-10-CM

## 2014-09-23 DIAGNOSIS — R29898 Other symptoms and signs involving the musculoskeletal system: Secondary | ICD-10-CM

## 2014-09-23 DIAGNOSIS — M25562 Pain in left knee: Secondary | ICD-10-CM

## 2014-09-23 NOTE — Therapy (Signed)
[facility] [address] [place], [place], [address] Phone: [phone]   Fax:  [phone]  Physical Therapy Treatment  Patient Details  Name: Connie Ross MRN: [id number] Date of Birth: Jun 11, 1955 Referring Provider:  Mirna Mires, MD  Encounter Date: 09/23/2014      PT End of Session - 09/23/14 1734    Visit Number 14   Number of Visits 15   Date for PT Re-Evaluation 09/26/14   Authorization Type BCBS    Authorization Time Period 07/16/14 to 09/16/14   PT Start Time 1650   PT Stop Time 1733   PT Time Calculation (min) 43 min   Activity Tolerance Patient tolerated treatment well   Behavior During Therapy [facility] for tasks assessed/performed      Past Medical History  Diagnosis Date  . Hypertension   . GERD (gastroesophageal reflux disease)   . High cholesterol   . Type II diabetes mellitus   . Rheumatoid arthritis   . Anxiety     takes no meds  . DVT (deep venous thrombosis)     Past Surgical History  Procedure Laterality Date  . Total knee arthroplasty Right 02/28/2013  . Total knee arthroplasty Right 02/27/2013    Procedure: TOTAL KNEE ARTHROPLASTY- knee;  Surgeon: Nadara Mustard, MD;  Location: [facility];  Service: Orthopedics;  Laterality: Right;  Right Total Knee Arthroplasty  . Eye surgery      bilateral cataracts  . Total knee arthroplasty Left 07/04/2014    Procedure: LEFT TOTAL KNEE ARTHROPLASTY;  Surgeon: Nadara Mustard, MD;  Location: [facility];  Service: Orthopedics;  Laterality: Left;    There were no vitals filed for this visit.  Visit Diagnosis:  Status post total left knee replacement  Left knee pain  Weakness of both lower extremities  Unsteadiness  Abnormality of gait  Knee stiffness, left  Right knee pain      Subjective Assessment - 09/23/14 1731    Subjective PT reports mild pain in Lt knee of 3/10 but daughter states she does ALOT at home, pretty much doing everything including  cooking/cleaning.  STates she only uses her [facility] with outdoor ambulation.  Upon discussion with daughter, pt is not having any diffiuclty completing any tasks at home.    Currently in Pain? Yes   Pain Score 3    Pain Location Knee   Pain Orientation Left                         [facility] Adult PT Treatment/Exercise - 09/23/14 1705    Knee/Hip Exercises: Stretches   Active Hamstring Stretch Both;3 reps;30 seconds   Active Hamstring Stretch Limitations 12 inch box    Knee: Self-Stretch to increase Flexion Left   Knee: Self-Stretch Limitations 15 reps, 10 seconds 6 inch box    Gastroc Stretch 3 reps;30 seconds   Gastroc Stretch Limitations slantboard    Knee/Hip Exercises: Aerobic   Stationary Bike full revolution seat 5 for ROM x 8 min   Knee/Hip Exercises: Standing   Forward Lunges Both;1 set;10 reps   Forward Lunges Limitations onto floor    Lateral Step Up Left;10 reps;Step Height: 2";Hand Hold: 1   Forward Step Up Left;10 reps;Step Height: 6";Hand Hold: 1   Functional Squat 15 reps   Functional Squat Limitations cueing for weight loading   Stairs 7" steps reciprocally 1HR ascending, 1HR descending 3RT   SLS unable Lt without HHA, Rt: max of 5"  without UE assist   Gait Training 1RT (226') without AD                  PT Short Term Goals - 09/12/14 1748    PT SHORT TERM GOAL #1   Title Patient will demonstrate no more than 4 degrees L knee extension and at least 110 degrees L knee flexion    Status Achieved   PT SHORT TERM GOAL #2   Title Patient will demonstrate bilateral lower extremity strength of at least 4+/5 and proximal muscle strength of at least 4/5    Status On-going   PT SHORT TERM GOAL #3   Title Patient will be able to ambulate unlimited distances with single point cane, minimal unsteadiness, pain L knee no more than 2/10   Baseline 09/12/2014:  Ambulates with SPC with no reports of pain or unsteadiness   Status Achieved   PT SHORT TERM GOAL  #4   Title Patient will be independent in correctly and consistently performing appropriate HEP, to be updated PRN    Status Achieved           PT Long Term Goals - 09/12/14 1750    PT LONG TERM GOAL #1   Title Patient will experience 0/10 pain in L knee during all functional standing and ambulation tasks    Status Achieved   PT LONG TERM GOAL #2   Title Patient will be able to ambulate unlimited distances with no assistive device and L knee pain 0/10   Baseline 09/12/2014:  Ambulates with no AD indoors and for 10 minutes intervals without AD outside   Status On-going   PT LONG TERM GOAL #3   Title Patient will demonstrate L knee extension of 0 degrees and L knee flexion of at least 120 degrees    Baseline 09/12/2014   PT LONG TERM GOAL #4   Title Patient will be able to ascend/descend at least 6 stairs with no railing, step over step pattern, no circumduction, pain L knee 0/10, no unsteadiness                Plan - 09/23/14 1735    Clinical Impression Statement Focused today on increasing stability in Lt LE.  Pt unable to stand on Lt LE without UE assist.  Encouraged patient to practice this task standing at kitchen sink. Able to negotiate 7" stairs reciprocally today but required 1 HHA ascending and 2 HHA descending.   No ankle pain voiced today and with much improved gait without using SPC.   No reports of pain during session.  Explained to daughter next visit would probably be her last and she was in agreement.    PT Next Visit Plan Re-eval next visit with anticipated discharge.         Problem List Patient Active Problem List   Diagnosis Date Noted  . Abnormality of gait 03/15/2013  . Right knee pain 03/15/2013  . Total knee replacement status 02/27/2013   Lurena Nida, PTA/CLT [phone]  09/23/2014, 5:38 PM  [facility] [address] [place], [place], [address] Phone: [phone]   Fax:  [phone]

## 2014-09-25 ENCOUNTER — Encounter (HOSPITAL_COMMUNITY): Payer: Self-pay | Admitting: Physical Therapy

## 2014-10-03 ENCOUNTER — Ambulatory Visit (HOSPITAL_COMMUNITY): Payer: BLUE CROSS/BLUE SHIELD | Admitting: Physical Therapy

## 2014-10-03 DIAGNOSIS — Z96652 Presence of left artificial knee joint: Secondary | ICD-10-CM | POA: Diagnosis not present

## 2014-10-03 DIAGNOSIS — M25562 Pain in left knee: Secondary | ICD-10-CM

## 2014-10-03 DIAGNOSIS — R29898 Other symptoms and signs involving the musculoskeletal system: Secondary | ICD-10-CM

## 2014-10-03 DIAGNOSIS — R269 Unspecified abnormalities of gait and mobility: Secondary | ICD-10-CM

## 2014-10-03 DIAGNOSIS — R2681 Unsteadiness on feet: Secondary | ICD-10-CM

## 2014-10-03 NOTE — Therapy (Signed)
[place], [place], [address] Phone: [phone]   Fax:  [phone]  Physical Therapy Treatment  Patient Details  Name: Connie Ross MRN: [id number] Date of Birth: 04/07/1955 Referring Provider:  Newt Minion, MD  Encounter Date: 10/03/2014      PT End of Session - 10/03/14 1549    Visit Number 15   Number of Visits 15   Authorization Type BCBS    PT Start Time 1884   PT Stop Time 1539   PT Time Calculation (min) 24 min   Activity Tolerance Patient tolerated treatment well   Behavior During Therapy [facility] for tasks assessed/performed      Past Medical History  Diagnosis Date  . Hypertension   . GERD (gastroesophageal reflux disease)   . High cholesterol   . Type II diabetes mellitus   . Rheumatoid arthritis   . Anxiety     takes no meds  . DVT (deep venous thrombosis)     Past Surgical History  Procedure Laterality Date  . Total knee arthroplasty Right 02/28/2013  . Total knee arthroplasty Right 02/27/2013    Procedure: TOTAL KNEE ARTHROPLASTY- knee;  Surgeon: Newt Minion, MD;  Location: [place];  Service: Orthopedics;  Laterality: Right;  Right Total Knee Arthroplasty  . Eye surgery      bilateral cataracts  . Total knee arthroplasty Left 07/04/2014    Procedure: LEFT TOTAL KNEE ARTHROPLASTY;  Surgeon: Newt Minion, MD;  Location: [place];  Service: Orthopedics;  Laterality: Left;    There were no vitals filed for this visit.  Visit Diagnosis:  Status post total left knee replacement  Left knee pain  Weakness of both lower extremities  Unsteadiness  Abnormality of gait      Subjective Assessment - 10/03/14 1519    Subjective Pt reports that she gets some pain when she is standing for too long. Pt reports that she is not having any difficulty with anything at home, and her ankle is what's bothering her.    How long can you sit comfortably? no limitations   How long can you stand  comfortably? no limitations   How long can you walk comfortably? no limitations   Currently in Pain? Yes   Pain Score 3    Pain Location Knee   Pain Orientation Left            [facility] PT Assessment - 10/03/14 0001    AROM   Left Knee Extension 1   Left Knee Flexion 116   Strength   Right Hip Flexion 4/5   Right Hip Extension 4/5   Right Hip ABduction 5/5   Left Hip Flexion 4/5   Left Hip Extension 4/5   Left Hip ABduction 4+/5   Right Knee Flexion 5/5   Right Knee Extension 5/5   Left Knee Flexion 5/5   Right Ankle Dorsiflexion 5/5   Standardized Balance Assessment   Five times sit to stand comments  13.8   Timed Up and Go Test   Normal TUG (seconds) 16.5                     [facility] Adult PT Treatment/Exercise - 10/03/14 0001    Ambulation/Gait   Stairs Yes   Stairs Assistance 6: Modified independent (Device/Increase time)   Stair Management Technique Two rails;Alternating pattern   Number of Stairs 8  4 x 2 RT   Height of Stairs 7  Knee/Hip Exercises: Stretches   Active Hamstring Stretch Both;3 reps;30 seconds   Active Hamstring Stretch Limitations 12 inch box                 PT Education - 10/03/14 1548    Education provided Yes   Education Details Goals, progress, and HEP reviewed          PT Short Term Goals - 10/03/14 1549    PT SHORT TERM GOAL #1   Title Patient will demonstrate no more than 4 degrees L knee extension and at least 110 degrees L knee flexion    Baseline 8/10- 7 degrees extension, 113 flexion    Time 3   Period Weeks   Status Achieved   PT SHORT TERM GOAL #2   Title Patient will demonstrate bilateral lower extremity strength of at least 4+/5 and proximal muscle strength of at least 4/5    Time 3   Period Weeks   Status Partially Met   PT SHORT TERM GOAL #3   Title Patient will be able to ambulate unlimited distances with single point cane, minimal unsteadiness, pain L knee no more than 2/10   Baseline  09/12/2014:  Ambulates with SPC with no reports of pain or unsteadiness   Time 3   Period Weeks   Status Achieved   PT SHORT TERM GOAL #4   Title Patient will be independent in correctly and consistently performing appropriate HEP, to be updated PRN    Baseline 8/10- patient reports she is compliant with HEP    Time 3   Period Weeks   Status Achieved           PT Long Term Goals - 10/03/14 1550    PT LONG TERM GOAL #1   Title Patient will experience 0/10 pain in L knee during all functional standing and ambulation tasks    Baseline 8/10- not really hurting during the day, but is bothering her at night    Time 6   Period Weeks   Status Achieved   PT LONG TERM GOAL #2   Title Patient will be able to ambulate unlimited distances with no assistive device and L knee pain 0/10   Time 6   Period Weeks   Status Partially Met   PT LONG TERM GOAL #3   Title Patient will demonstrate L knee extension of 0 degrees and L knee flexion of at least 120 degrees    Time 6   Period Weeks   Status Not Met   PT LONG TERM GOAL #4   Title Patient will be able to ascend/descend at least 6 stairs with no railing, step over step pattern, no circumduction, pain L knee 0/10, no unsteadiness    Time 6   Period Weeks   Status Achieved               Plan - 10/03/14 1549    Clinical Impression Statement Reassessment completed today. Pt demonstrates improvements in ROM, strength, gait, and functional mobility. She is now ambulating without AD in her home and for short distances in the community. Pt's biggest limitation at this time is her R ankle pain, which has hindered her gait mechanics. Pt was educated on ambulating with SBQC in L hand instead of R to improve WB on RLE. Pt has met all LTGs except for the goal set for strength, which will improve with time and with pt's HEP. Pt has shown good compliance with HEP, and is being discharged  to continue independently at this time.          Problem  List Patient Active Problem List   Diagnosis Date Noted  . Abnormality of gait 03/15/2013  . Right knee pain 03/15/2013  . Total knee replacement status 02/27/2013    PHYSICAL THERAPY DISCHARGE SUMMARY  Visits from Start of Care: 15  Current functional level related to goals / functional outcomes: Pt demonstrates improvements in knee ROM and strength, and is now able to ambulate household and short community distances without AD and with no pain in knee.    Remaining deficits: Pt continues to lack some ROM in L knee. She is having difficulty with ambulating [place] distances due to pain in her R ankle.    Education / Equipment: HEP  Plan: Patient agrees to discharge.  Patient goals were partially met. Patient is being discharged due to being pleased with the current functional level.  ?????        Hilma Favors, PT, DPT [phone] 10/03/2014, 3:56 PM  [place] [address] [place], [place], [address] Phone: [phone]   Fax:  [phone]

## 2014-11-12 NOTE — Telephone Encounter (Signed)
Called concerning apt date and time  Casey Cockerham, LPTA; CBIS [phone]  

## 2015-07-29 ENCOUNTER — Other Ambulatory Visit: Payer: Self-pay | Admitting: Orthopedic Surgery

## 2015-07-29 DIAGNOSIS — M545 Low back pain: Secondary | ICD-10-CM

## 2015-08-08 ENCOUNTER — Ambulatory Visit
Admission: RE | Admit: 2015-08-08 | Discharge: 2015-08-08 | Disposition: A | Discharge status: Home / Self Care | Payer: BLUE CROSS/BLUE SHIELD | Source: Ambulatory Visit | Attending: Orthopedic Surgery | Admitting: Orthopedic Surgery

## 2015-08-08 DIAGNOSIS — M545 Low back pain: Secondary | ICD-10-CM

## 2015-09-05 DIAGNOSIS — M545 Low back pain: Secondary | ICD-10-CM | POA: Diagnosis not present

## 2015-09-05 DIAGNOSIS — E1165 Type 2 diabetes mellitus with hyperglycemia: Secondary | ICD-10-CM | POA: Diagnosis not present

## 2015-09-05 DIAGNOSIS — E669 Obesity, unspecified: Secondary | ICD-10-CM | POA: Diagnosis not present

## 2015-10-14 DIAGNOSIS — M545 Low back pain: Secondary | ICD-10-CM | POA: Diagnosis not present

## 2015-10-14 DIAGNOSIS — M4317 Spondylolisthesis, lumbosacral region: Secondary | ICD-10-CM | POA: Diagnosis not present

## 2015-10-14 DIAGNOSIS — Z6841 Body Mass Index (BMI) 40.0 and over, adult: Secondary | ICD-10-CM | POA: Diagnosis not present

## 2015-10-14 DIAGNOSIS — Z23 Encounter for immunization: Secondary | ICD-10-CM | POA: Diagnosis not present

## 2015-10-14 DIAGNOSIS — I10 Essential (primary) hypertension: Secondary | ICD-10-CM | POA: Diagnosis not present

## 2015-10-14 DIAGNOSIS — E1165 Type 2 diabetes mellitus with hyperglycemia: Secondary | ICD-10-CM | POA: Diagnosis not present

## 2015-10-14 DIAGNOSIS — M5417 Radiculopathy, lumbosacral region: Secondary | ICD-10-CM | POA: Diagnosis not present

## 2015-10-14 DIAGNOSIS — M069 Rheumatoid arthritis, unspecified: Secondary | ICD-10-CM | POA: Diagnosis not present

## 2015-10-29 DIAGNOSIS — M4317 Spondylolisthesis, lumbosacral region: Secondary | ICD-10-CM | POA: Diagnosis not present

## 2015-10-29 DIAGNOSIS — M5417 Radiculopathy, lumbosacral region: Secondary | ICD-10-CM | POA: Diagnosis not present

## 2015-11-21 ENCOUNTER — Other Ambulatory Visit: Payer: Self-pay | Admitting: Rheumatology

## 2015-11-23 ENCOUNTER — Telehealth: Payer: Self-pay | Admitting: Radiology

## 2015-11-23 ENCOUNTER — Other Ambulatory Visit: Payer: Self-pay | Admitting: Rheumatology

## 2015-11-23 LAB — CBC WITH DIFFERENTIAL/PLATELET
Basophils Absolute: 0 cells/uL (ref 0–200)
Basophils Relative: 0 %
Eosinophils Absolute: 536 cells/uL — ABNORMAL HIGH (ref 15–500)
Eosinophils Relative: 8 %
HCT: 35.7 % (ref 35.0–45.0)
Hemoglobin: 11.8 g/dL (ref 11.7–15.5)
Lymphocytes Relative: 23 %
Lymphs Abs: 1541 cells/uL (ref 850–3900)
MCH: 31.7 pg (ref 27.0–33.0)
MCHC: 33.1 g/dL (ref 32.0–36.0)
MCV: 96 fL (ref 80.0–100.0)
MPV: 9.2 fL (ref 7.5–12.5)
Monocytes Absolute: 335 cells/uL (ref 200–950)
Monocytes Relative: 5 %
Neutro Abs: 4288 cells/uL (ref 1500–7800)
Neutrophils Relative %: 64 %
Platelets: 266 10*3/uL (ref 140–400)
RBC: 3.72 MIL/uL — ABNORMAL LOW (ref 3.80–5.10)
RDW: 14.3 % (ref 11.0–15.0)
WBC: 6.7 10*3/uL (ref 3.8–10.8)

## 2015-11-23 LAB — COMPLETE METABOLIC PANEL WITH GFR
ALT: 24 U/L (ref 6–29)
AST: 23 U/L (ref 10–35)
Albumin: 3.9 g/dL (ref 3.6–5.1)
Alkaline Phosphatase: 96 U/L (ref 33–130)
BUN: 16 mg/dL (ref 7–25)
CO2: 29 mmol/L (ref 20–31)
Calcium: 9.3 mg/dL (ref 8.6–10.4)
Chloride: 102 mmol/L (ref 98–110)
Creat: 0.86 mg/dL (ref 0.50–0.99)
GFR, Est African American: 85 mL/min (ref >=60)
GFR, Est Non African American: 74 mL/min (ref >=60)
Glucose, Bld: 242 mg/dL — ABNORMAL HIGH (ref 65–99)
Potassium: 4 mmol/L (ref 3.5–5.3)
Sodium: 140 mmol/L (ref 135–146)
Total Bilirubin: 0.3 mg/dL (ref 0.2–1.2)
Total Protein: 6.9 g/dL (ref 6.1–8.1)

## 2015-11-23 NOTE — Telephone Encounter (Signed)
Called daughter she states labs were done today. Told her I will call with the results when they are received. Last visit  07/02/15 Next visit 01/01/16 Tb neg 08/03/15

## 2015-11-23 NOTE — Telephone Encounter (Signed)
Call patient/ labs past due for Humira/ Rasuvo

## 2015-11-26 LAB — QUANTIFERON TB GOLD ASSAY (BLOOD)
Interferon Gamma Release Assay: NEGATIVE
Mitogen-Nil: 2.06 IU/mL
Quantiferon Nil Value: 0.17 IU/mL
Quantiferon Tb Ag Minus Nil Value: 0.04 IU/mL

## 2015-11-30 DIAGNOSIS — M4317 Spondylolisthesis, lumbosacral region: Secondary | ICD-10-CM | POA: Diagnosis not present

## 2015-11-30 DIAGNOSIS — M5417 Radiculopathy, lumbosacral region: Secondary | ICD-10-CM | POA: Diagnosis not present

## 2015-12-01 ENCOUNTER — Telehealth: Payer: Self-pay | Admitting: Radiology

## 2015-12-01 MED ORDER — RASUVO 20 MG/0.4ML ~~LOC~~ SOAJ: 20.0000 mg | SUBCUTANEOUS | 1 refills | Status: DC

## 2015-12-01 MED ORDER — HUMIRA PEN 40 MG/0.8ML ~~LOC~~ PNKT: 40.0000 mg | PEN_INJECTOR | SUBCUTANEOUS | 1 refills | Status: DC

## 2015-12-01 NOTE — Telephone Encounter (Signed)
Last visit  07/02/15 Next visit 01/01/16 Normal Labs 11/2015  including neg TB gold Ok to refill per Dr Corliss Skains

## 2015-12-01 NOTE — Telephone Encounter (Signed)
Labs in [facility] normal/ neg Humira and Rasuvo have been requested

## 2015-12-03 ENCOUNTER — Other Ambulatory Visit: Payer: Self-pay | Admitting: Rheumatology

## 2016-01-01 ENCOUNTER — Ambulatory Visit: Payer: Self-pay | Admitting: Rheumatology

## 2016-01-19 ENCOUNTER — Other Ambulatory Visit: Payer: Self-pay | Admitting: Radiology

## 2016-01-19 ENCOUNTER — Telehealth: Payer: Self-pay | Admitting: Radiology

## 2016-01-19 ENCOUNTER — Other Ambulatory Visit: Payer: Self-pay | Admitting: Rheumatology

## 2016-01-19 MED ORDER — METHOTREXATE (PF) 20 MG/0.4ML ~~LOC~~ SOAJ: SUBCUTANEOUS | 0 refills | Status: DC

## 2016-01-19 MED ORDER — ADALIMUMAB 40 MG/0.8ML ~~LOC~~ AJKT: 1.0000 "pen " | AUTO-INJECTOR | SUBCUTANEOUS | 0 refills | Status: DC

## 2016-01-19 NOTE — Telephone Encounter (Signed)
Patients daughter walked in, they can no longer use [facility], have resent remaining refills of the Humira and Rasu[facility] per her request

## 2016-01-19 NOTE — Telephone Encounter (Signed)
Patients daughter walked in, they can no longer use [facility], have resent remaining refills of the Humira and Rasuvo [facility] per her request   Daughter requested samples but at the time I could not review chart to see if appropriate , told her someone would review and call her if available

## 2016-01-19 NOTE — Telephone Encounter (Signed)
Patient needs a refill of of Humira and Rasuvo. Due to patient's new insurance [facility] [address].

## 2016-01-19 NOTE — Telephone Encounter (Signed)
Patients daughter walked in, they can no longer use [facility], have resent remaining refills of the Humira and Rasuvo in to [facility] per her request  

## 2016-01-23 DIAGNOSIS — E1169 Type 2 diabetes mellitus with other specified complication: Secondary | ICD-10-CM | POA: Diagnosis not present

## 2016-01-25 NOTE — Telephone Encounter (Signed)
No answers/ RX has been sent/ patient due for follow up soon, will discuss.

## 2016-01-26 ENCOUNTER — Ambulatory Visit: Payer: Self-pay | Admitting: Rheumatology

## 2016-01-28 ENCOUNTER — Telehealth: Payer: Self-pay | Admitting: Rheumatology

## 2016-01-28 ENCOUNTER — Other Ambulatory Visit: Payer: Self-pay | Admitting: *Deleted

## 2016-01-28 MED ORDER — METHOTREXATE (PF) 20 MG/0.4ML ~~LOC~~ SOAJ: SUBCUTANEOUS | 0 refills | Status: DC

## 2016-01-28 NOTE — Telephone Encounter (Signed)
Patient's daughter states that the [facility] Patient's remaining refill have been resent to the correct pharmacy. Patient is also requesting we call them to verify the diagnosis code for Humira use. Patient's daughter provided number [phone].

## 2016-01-28 NOTE — Progress Notes (Signed)
Patient's daughter states that the [facility]. Patient's remaining refill have been resent to the correct pharmacy. Patient is also requesting we call them to verify the diagnosis code for Humira use. Patient's daughter provided number [phone].  

## 2016-02-01 NOTE — Progress Notes (Signed)
Office Visit Note  Patient: Connie Ross             Date of Birth: 09/13/1955           MRN: [id number]             PCP: Maggie Font, MD Referring: Iona Beard, MD Visit Date: 02/03/2016 Occupation: @GUAROCC @    Subjective:  Pain hands and feet   History of Present Illness: Connie Ross is a 61 y.o. female with seronegative rheumatoid arthritis. She ran out of Humira and Rasuvo about 1-1/2 month ago due to insurance issues. She's been having increased pain and discomfort. She complains of increased pain and swelling in her hands and feet. She continues to have some lower back pain from underlying disc disease. Her total knee replacements are doing well.  Activities of Daily Living:  Patient reports morning stiffness for 2 hours.   Patient Reports nocturnal pain.  Difficulty dressing/grooming: Reports Difficulty climbing stairs: Reports Difficulty getting out of chair: Reports Difficulty using hands for taps, buttons, cutlery, and/or writing: Reports   Review of Systems  Constitutional: Positive for fatigue. Negative for night sweats, weight gain, weight loss and weakness.  HENT: Negative for mouth sores, trouble swallowing, trouble swallowing, mouth dryness and nose dryness.   Eyes: Negative for pain, redness, visual disturbance and dryness.  Respiratory: Negative for cough, shortness of breath and difficulty breathing.   Cardiovascular: Negative for chest pain, palpitations, hypertension, irregular heartbeat and swelling in legs/feet.  Gastrointestinal: Negative for blood in stool, constipation and diarrhea.  Endocrine: Negative for increased urination.  Genitourinary: Negative for vaginal dryness.  Musculoskeletal: Positive for arthralgias, joint pain, joint swelling, myalgias, morning stiffness and myalgias. Negative for muscle weakness and muscle tenderness.  Skin: Negative for color change, rash, hair loss, skin tightness, ulcers and sensitivity to  sunlight.  Allergic/Immunologic: Negative for susceptible to infections.  Neurological: Negative for dizziness, memory loss and night sweats.  Hematological: Negative for swollen glands.  Psychiatric/Behavioral: Positive for depressed mood and sleep disturbance. The patient is not nervous/anxious.     PMFS History:  Patient Active Problem List   Diagnosis Date Noted  . Rheumatoid arthritis of multiple sites with negative rheumatoid factor ([place]) 02/03/2016  . High risk medication use 02/03/2016  . 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 Essential hypertension 02/03/2016  . History of diabetes mellitus 02/03/2016  . History of diabetic neuropathy 02/03/2016  . Dyslipidemia 02/03/2016  . Vitamin D deficiency 02/03/2016  . Abnormality of gait 03/15/2013  . Right knee pain 03/15/2013  . History of total knee replacement, bilateral 02/27/2013    Past Medical History:  Diagnosis Date  . Anxiety    takes no meds  . DVT (deep venous thrombosis) ([place])   . GERD (gastroesophageal reflux disease)   . High cholesterol   . Hypertension   . Rheumatoid arthritis ([place])   . Type II diabetes mellitus ([place])     No family history on file. Past Surgical History:  Procedure Laterality Date  . EYE SURGERY     bilateral cataracts  . TOTAL KNEE ARTHROPLASTY Right 02/28/2013  . TOTAL KNEE ARTHROPLASTY Right 02/27/2013   Procedure: TOTAL KNEE ARTHROPLASTY- knee;  Surgeon: Newt Minion, MD;  Location: [place];  Service: Orthopedics;  Laterality: Right;  Right Total Knee Arthroplasty  . TOTAL KNEE ARTHROPLASTY Left 07/04/2014   Procedure: LEFT TOTAL KNEE ARTHROPLASTY;  Surgeon: Newt Minion, MD;  Location: [place];  Service: Orthopedics;  Laterality: Left;  Social History   Social History Narrative  . No narrative on file     Objective: Vital Signs: BP (!) 146/70   Pulse 90   Resp 16   Ht 5' 1"  (1.549 m)   Wt 218 lb (98.9 kg)   LMP  (LMP Unknown)   BMI 41.19 kg/m    Physical Exam    Constitutional: She is oriented to person, place, and time. She appears well-developed and well-nourished.  HENT:  Head: Normocephalic and atraumatic.  Eyes: Conjunctivae and EOM are normal.  Neck: Normal range of motion.  Cardiovascular: Normal rate, regular rhythm, normal heart sounds and intact distal pulses.   Pulmonary/Chest: Effort normal and breath sounds normal.  Abdominal: Soft. Bowel sounds are normal.  Lymphadenopathy:    She has no cervical adenopathy.  Neurological: She is alert and oriented to person, place, and time.  Skin: Skin is warm and dry. Capillary refill takes less than 2 seconds.  Psychiatric: She has a normal mood and affect. Her behavior is normal.  Nursing note and vitals reviewed.    Musculoskeletal Exam: Described below  CDAI Exam: CDAI Homunculus Exam:   Tenderness:  Right hand: 2nd MCP and 3rd MCP Left hand: 2nd MCP and 3rd MCP RLE: tibiotalar LLE: tibiotalar Right foot: 2nd MTP and 3rd MTP Left foot: 1st MTP  Swelling:  Right hand: 2nd MCP Left hand: 2nd MCP  Joint Counts:  CDAI Tender Joint count: 4 CDAI Swollen Joint count: 2  Global Assessments:  Patient Global Assessment: 8 Provider Global Assessment: 5  CDAI Calculated Score: 19    Investigation: Findings:  June 2015 TB Gold negative, April 2012 chest x-ray negative, hepatitis panel negative 11/23/2015 CBC normal CMP elevated glucose, TB gold negative    Imaging: No results found.  Speciality Comments: No specialty comments available.    Procedures:  No procedures performed Allergies: Patient has no known allergies.   Assessment / Plan:     Visit Diagnoses: Rheumatoid arthritis of multiple sites with negative rheumatoid factor (HCC) - RF negative, CCP negative, elevated ESR, positive ANA, positive Ro. She is having a flare off Humira and methotrexate. She complains of discussed stiffness and discomfort on regular basis. She had minimal synovitis on examination some  synovial thickening in her hands. She had discomfort with range of motion of her bilateral knee joints ankles and tenderness across her MTP joints.  High risk medication use -she is supposed to be on Humira every other week, Rasuvo 20 mg once a week, folic acid 2 mg a day which she ran out of her medications about 6 weeks ago. We will refill her meds today and monitor her labs closely - Plan: CBC with Differential/Platelet, COMPLETE METABOLIC PANEL WITH GFR, CBC with Differential/Platelet, COMPLETE METABOLIC PANEL WITH GFR  DDD lumbar spine: She continues to have some chronic lower back pain  History of total knee replacement, bilateral: Her knee joints are doing well  Her other medical problems are listed as follows:  Essential hypertension  History of diabetic neuropathy  Dyslipidemia  Vitamin D deficiency  History of diabetes mellitus    Orders: Orders Placed This Encounter  Procedures  . CBC with Differential/Platelet  . COMPLETE METABOLIC PANEL WITH GFR  . CBC with Differential/Platelet  . COMPLETE METABOLIC PANEL WITH GFR   Meds ordered this encounter  Medications  . DISCONTD: Adalimumab (HUMIRA PEN) 40 MG/0.8ML PNKT    Sig: Inject 1 pen into the skin every 14 (fourteen) days.    Dispense:  1 each    Refill:  0  . DISCONTD: Methotrexate, PF, (RASUVO) 20 MG/0.4ML SOAJ    Sig: INJECT 1 PEN WEEKLY    Dispense:  1.6 mL    Refill:  0  . Adalimumab (HUMIRA PEN) 40 MG/0.8ML PNKT    Sig: Inject 1 pen into the skin every 14 (fourteen) days.    Dispense:  3 each    Refill:  0  . Methotrexate, PF, (RASUVO) 20 MG/0.4ML SOAJ    Sig: INJECT 1 PEN WEEKLY    Dispense:  4 pen    Refill:  0    Face-to-face time spent with patient was 53mnutes. 50% of time was spent in counseling and coordination of care.  Follow-Up Instructions: Return in about 5 months (around 07/02/2016) for Rheumatoid arthritis.   SBo Merino MD  Note - This record has been created using DR[employer] producer  Chart creation errors have been sought, but may not always  have been located. Such creation errors do not reflect on  the standard of medical care.

## 2016-02-02 ENCOUNTER — Other Ambulatory Visit: Payer: Self-pay | Admitting: Radiology

## 2016-02-02 NOTE — Telephone Encounter (Signed)
Refill request received via fax for  Humira and Rasuvo, these were sent in 2 weeks ago, patient coming in for appointment this week, will discuss.

## 2016-02-03 ENCOUNTER — Encounter: Payer: Self-pay | Admitting: Rheumatology

## 2016-02-03 ENCOUNTER — Ambulatory Visit (INDEPENDENT_AMBULATORY_CARE_PROVIDER_SITE_OTHER): Payer: BLUE CROSS/BLUE SHIELD | Admitting: Rheumatology

## 2016-02-03 VITALS — BP 146/70 | HR 90 | Resp 16 | Ht 61.0 in | Wt 218.0 lb

## 2016-02-03 DIAGNOSIS — I10 Essential (primary) hypertension: Secondary | ICD-10-CM | POA: Insufficient documentation

## 2016-02-03 DIAGNOSIS — M47816 Spondylosis without myelopathy or radiculopathy, lumbar region: Secondary | ICD-10-CM

## 2016-02-03 DIAGNOSIS — E559 Vitamin D deficiency, unspecified: Secondary | ICD-10-CM

## 2016-02-03 DIAGNOSIS — Z96653 Presence of artificial knee joint, bilateral: Secondary | ICD-10-CM | POA: Diagnosis not present

## 2016-02-03 DIAGNOSIS — M5417 Radiculopathy, lumbosacral region: Secondary | ICD-10-CM | POA: Diagnosis not present

## 2016-02-03 DIAGNOSIS — E785 Hyperlipidemia, unspecified: Secondary | ICD-10-CM

## 2016-02-03 DIAGNOSIS — M4317 Spondylolisthesis, lumbosacral region: Secondary | ICD-10-CM | POA: Diagnosis not present

## 2016-02-03 DIAGNOSIS — Z79899 Other long term (current) drug therapy: Secondary | ICD-10-CM | POA: Diagnosis not present

## 2016-02-03 DIAGNOSIS — M0609 Rheumatoid arthritis without rheumatoid factor, multiple sites: Secondary | ICD-10-CM | POA: Diagnosis not present

## 2016-02-03 DIAGNOSIS — Z8639 Personal history of other endocrine, nutritional and metabolic disease: Secondary | ICD-10-CM

## 2016-02-03 LAB — CBC WITH DIFFERENTIAL/PLATELET
Basophils Absolute: 86 cells/uL (ref 0–200)
Basophils Relative: 1 %
Eosinophils Absolute: 860 cells/uL — ABNORMAL HIGH (ref 15–500)
Eosinophils Relative: 10 %
HCT: 36.6 % (ref 35.0–45.0)
Hemoglobin: 11.8 g/dL (ref 11.7–15.5)
Lymphocytes Relative: 21 %
Lymphs Abs: 1806 cells/uL (ref 850–3900)
MCH: 30.3 pg (ref 27.0–33.0)
MCHC: 32.2 g/dL (ref 32.0–36.0)
MCV: 93.8 fL (ref 80.0–100.0)
MPV: 9.6 fL (ref 7.5–12.5)
Monocytes Absolute: 430 cells/uL (ref 200–950)
Monocytes Relative: 5 %
Neutro Abs: 5418 cells/uL (ref 1500–7800)
Neutrophils Relative %: 63 %
Platelets: 308 10*3/uL (ref 140–400)
RBC: 3.9 MIL/uL (ref 3.80–5.10)
RDW: 13.7 % (ref 11.0–15.0)
WBC: 8.6 10*3/uL (ref 3.8–10.8)

## 2016-02-03 MED ORDER — METHOTREXATE (PF) 20 MG/0.4ML ~~LOC~~ SOAJ: SUBCUTANEOUS | 0 refills | Status: DC

## 2016-02-03 MED ORDER — ADALIMUMAB 40 MG/0.8ML ~~LOC~~ AJKT: 1.0000 "pen " | AUTO-INJECTOR | SUBCUTANEOUS | 0 refills | Status: DC

## 2016-02-03 MED ORDER — METHOTREXATE (PF) 20 MG/0.4ML ~~LOC~~ SOAJ: 20.0000 mg | SUBCUTANEOUS | 0 refills | Status: DC

## 2016-02-03 MED ORDER — ADALIMUMAB 40 MG/0.8ML ~~LOC~~ AJKT: 40.0000 mg | AUTO-INJECTOR | SUBCUTANEOUS | 0 refills | Status: DC

## 2016-02-03 NOTE — Telephone Encounter (Signed)
[facility] and verified the ICD 10 code for the medication.

## 2016-02-03 NOTE — Patient Instructions (Signed)
Standing Labs We placed an order today for your standing lab work.    Please come back and get your standing labs in April and every 3 months  We have open lab Monday through Friday from 8:30-11:30 AM and 1:30-4 PM at the office of Dr. Jashley Yellin/Naitik Panwala, PA.   The office is located at [address], [address], [place], [place] [address] No appointment is necessary.   Labs are drawn by [facility].  You may receive a bill from [facility] for your lab work.    

## 2016-02-03 NOTE — Progress Notes (Signed)
Rheumatology Medication Review by a Pharmacist Does the patient feel that his/her medications are working for him/her?  Yes Has the patient been experiencing any side effects to the medications prescribed?  No Does the patient have any problems obtaining medications?  Yes  Issues to address at subsequent visits: Access to medications   Pharmacist comments:  Connie Ross is a pleasant 61 yo F who presents for follow up of her rheumatoid arthritis.  She is prescribed Humira 40 mg every 14 days, Rasuvo 20 mg weekly, and folic acid 2 mg daily.  Patient reports she has been out of Humira and Rasuvo since December and she has been unable to fill the prescriptions under her new insurance.  Reviewed patient's insurance.  She has original [employer] and [facility]  Identified that Humira must be filled at [facility].  I called and spoke to Connie Ross in the [facility] department who confirms that Humira is ready for patient has a $5 copay.  I gave patient the phone number for [facility] to schedule her delivery of Humira.  I was informed that Rasuvo needs a prior authorization.  Will submit prior authorization on Cover My Meds.  I advised patient that if PA is not approved, alternative would be methotrexate vial and syringe.  Patient prefers to use Rasuvo, but wanted to go ahead and learn how to use vial and syringe if needed.  Educated patient on how to use a vial and syringe and reviewed injection technique with patient.  Provided patient on educational material regarding injection technique and storage of methotrexate.  Will update patient as soon as I know status of PA.    Discussed medications with Dr. Corliss Skains who agreed to provide patient with a sample of Humira and Rasuvo.  Patient was provided with one sample pen of Humira and Rasuvo 20 mg while she is waiting for her prescriptions to be processed/filled.  The patient has been instructed regarding the correct  time, dose, and frequency of taking this medication, including desired effects and most common side effects.   [name], Pharm.D., BCPS, CPP Clinical Pharmacist Pager: [phone] Phone: [phone] 02/03/2016 4:07 PM

## 2016-02-04 ENCOUNTER — Telehealth: Payer: Self-pay | Admitting: Pharmacist

## 2016-02-04 LAB — COMPLETE METABOLIC PANEL WITH GFR
ALT: 25 U/L (ref 6–29)
AST: 27 U/L (ref 10–35)
Albumin: 3.6 g/dL (ref 3.6–5.1)
Alkaline Phosphatase: 88 U/L (ref 33–130)
BUN: 15 mg/dL (ref 7–25)
CO2: 27 mmol/L (ref 20–31)
Calcium: 9.3 mg/dL (ref 8.6–10.4)
Chloride: 101 mmol/L (ref 98–110)
Creat: 0.71 mg/dL (ref 0.50–0.99)
GFR, Est African American: >89 mL/min (ref >=60)
GFR, Est Non African American: >89 mL/min (ref >=60)
Glucose, Bld: 302 mg/dL — ABNORMAL HIGH (ref 65–99)
Potassium: 4.3 mmol/L (ref 3.5–5.3)
Sodium: 139 mmol/L (ref 135–146)
Total Bilirubin: 0.3 mg/dL (ref 0.2–1.2)
Total Protein: 6.9 g/dL (ref 6.1–8.1)

## 2016-02-04 NOTE — Telephone Encounter (Signed)
Received approval for patient's Rasuvo pens from 02/03/2016-02/02/2018.  I called patient to inform her.  Patient's daughter, [name], answered the phone.  Patient has given permission to speak with her daughter.    I informed [name] that Connie Ross's Rasuvo was approved by her insurance.  Advised they should be able to contact patient's pharmacy to have that prescription filled.  Advised them to call me if they have any further difficulty having the prescription filled.  Veronica voiced understanding and denied any further questions.    [name], Pharm.D., BCPS, CPP Clinical Pharmacist Pager: [phone] Phone: [phone] 02/04/2016 8:36 AM

## 2016-02-08 ENCOUNTER — Telehealth: Payer: Self-pay | Admitting: Pharmacist

## 2016-02-08 NOTE — Telephone Encounter (Signed)
I called patient to follow up on her medications.  Last week she was having trouble getting her Humira and Rasuvo.  I spoke to her daughter, [name], who reports they do not have the medications yet, but she confirms the medications have been filled and they have a delivery date set.  She did voice that the Rasuvo copay has increased even with the coupon card.  Advised that we can switch patient to methotrexate vial and syringe in the future if they have difficulty affording the medication.  They are not interested at this time but will let me know if they decide to switch.  She denies any other questions or concerns regarding her medications at this time.  Advised them to call me if they have any problems getting the medication in the future.     [name], Pharm.D., BCPS, CPP Clinical Pharmacist Pager: [phone] Phone: [phone] 02/08/2016 8:53 AM

## 2016-02-22 DIAGNOSIS — M4317 Spondylolisthesis, lumbosacral region: Secondary | ICD-10-CM | POA: Diagnosis not present

## 2016-02-22 DIAGNOSIS — M5417 Radiculopathy, lumbosacral region: Secondary | ICD-10-CM | POA: Diagnosis not present

## 2016-02-24 ENCOUNTER — Telehealth: Payer: Self-pay | Admitting: Pharmacist

## 2016-02-24 NOTE — Telephone Encounter (Signed)
Received two faxes from [facility] regarding patient's Humira and Rasuvo.  Humira was requesting a prior authorization and Rasuvo fax stated the claim was rejected by insurance.  I spoke to patient's daughter, [name], regarding patient's medications.  She confirmed that patient is now filling her Humira and Rasuvo [facility] and she did get her prescription earlier this month.  I called [facility] to inform them that patient is now using [facility] for these two medications.    [name], Pharm.D., BCPS, CPP Clinical Pharmacist Pager: [phone] Phone: [phone] 02/24/2016 8:31 AM

## 2016-02-25 NOTE — Progress Notes (Signed)
Received a fax from [facility] regarding a prior authorization approval for Humira from 02/25/16 to 02/24/17.   Reference number:[id number]   Will send document to scan center.  Lonna Rabold, [place], CPhT  4:28 PM

## 2016-03-04 ENCOUNTER — Other Ambulatory Visit: Payer: Self-pay | Admitting: Rheumatology

## 2016-03-04 NOTE — Telephone Encounter (Signed)
ok 

## 2016-03-04 NOTE — Telephone Encounter (Signed)
Last Visit: 02/03/16 Next Visit: 06/30/16 Labs: 02/03/16 Elevated glucose  Okay to refill Rasuvo?

## 2016-03-23 DIAGNOSIS — M5417 Radiculopathy, lumbosacral region: Secondary | ICD-10-CM | POA: Diagnosis not present

## 2016-03-23 DIAGNOSIS — M4317 Spondylolisthesis, lumbosacral region: Secondary | ICD-10-CM | POA: Diagnosis not present

## 2016-03-29 DIAGNOSIS — M4317 Spondylolisthesis, lumbosacral region: Secondary | ICD-10-CM | POA: Diagnosis not present

## 2016-03-29 DIAGNOSIS — M5136 Other intervertebral disc degeneration, lumbar region: Secondary | ICD-10-CM | POA: Diagnosis not present

## 2016-03-29 DIAGNOSIS — M48062 Spinal stenosis, lumbar region with neurogenic claudication: Secondary | ICD-10-CM | POA: Diagnosis not present

## 2016-03-29 DIAGNOSIS — M79671 Pain in right foot: Secondary | ICD-10-CM | POA: Diagnosis not present

## 2016-03-29 DIAGNOSIS — M4726 Other spondylosis with radiculopathy, lumbar region: Secondary | ICD-10-CM | POA: Diagnosis not present

## 2016-03-29 DIAGNOSIS — M546 Pain in thoracic spine: Secondary | ICD-10-CM | POA: Diagnosis not present

## 2016-04-06 ENCOUNTER — Ambulatory Visit (INDEPENDENT_AMBULATORY_CARE_PROVIDER_SITE_OTHER): Payer: Medicare Other

## 2016-04-06 ENCOUNTER — Ambulatory Visit (INDEPENDENT_AMBULATORY_CARE_PROVIDER_SITE_OTHER): Payer: BLUE CROSS/BLUE SHIELD | Admitting: Orthopedic Surgery

## 2016-04-06 DIAGNOSIS — M79671 Pain in right foot: Secondary | ICD-10-CM

## 2016-04-06 NOTE — Progress Notes (Signed)
Office Visit Note   Patient: Connie Ross           Date of Birth: 03-27-1955           MRN: [id number] Visit Date: 04/06/2016              Requested by: Mirna Mires, MD [address] [place], [place] [address] PCP: Evlyn Courier, MD  No chief complaint on file.     HPI: Patient is a 61 year old woman with multiple medical problems including rheumatoid arthritis for 30 years type 2 diabetes with hypertension and morbid obesity with global right foot pain. Patient has tried sneakers without relief. Patient is seen Dr. Newell Coral for evaluation for back surgery that she was advised that she would need to have her foot functional and able to ambulate and pertussis up 8 with rehabilitation therapy for her back. Patient complains of heel pain and ankle pain midfoot pain and forefoot pain.  Assessment & Plan: Visit Diagnoses:  1. Pain in right foot     Plan: Discussed with the patient that with her multiple joint involvement in her foot there is not any good surgical options. Any fusion of a joint in her foot would increase pressure on the other arthritic joints and increase her foot pain. I have recommended a stiffer soled walking shoe such a new balance walking shoe with a soft multi density orthotic that she could get at [employer]. Discussed that insurance would not cover custom orthotics.  Follow-Up Instructions: Return if symptoms worsen or fail to improve.   Ortho Exam  Patient is alert, oriented, no adenopathy, well-dressed, normal affect, normal respiratory effort. Patient is currently ambulating in a wheelchair she is a good dorsalis pedis and posterior tibial pulse. 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 She is tender to palpation along the posterior tibial tendon as well as tender to palpation in the sinus Tarsi with lateral impingement. She is also tender to palpation anteriorly over the ankle and pain with range  of motion of the ankle and pain with range of motion of the subtalar joint. She is tender to palpation across the Lisfranc complex and has pain to palpation beneath all of her metatarsal heads. There is no redness no cellulitis no swelling no signs of gout. Radiographs have no periarticular cystic changes.  Imaging: Xr Foot Complete Right  Result Date: 04/06/2016 Three-view radiographs of the right foot shows advanced arthritic changes throughout the foot and ankle. Patient has collapse through the tibial talar joint with arthritic changes and bony spurs including spurs around the tibial talar joint spurs around the talonavicular and navicular to medial cuneiform. Patient has arthritic changes across the midfoot including the Lisfranc joint with bunion deformity of the great toe with good joint space between the remaining MTP joints.   Labs: No results found for: HGBA1C, ESRSEDRATE, CRP, LABURIC, REPTSTATUS, GRAMSTAIN, CULT, LABORGA  Orders:  Orders Placed This Encounter  Procedures  . XR Foot Complete Right   No orders of the defined types were placed in this encounter.    Procedures: No procedures performed  Clinical Data: No additional findings.  ROS:  All other systems negative, except as noted in the HPI. Review of Systems  Objective: Vital Signs: LMP  (LMP Unknown)   Specialty Comments:  No specialty comments available.  PMFS History: Patient Active Problem List   Diagnosis Date Noted  . Pain in right foot 04/06/2016  . Rheumatoid arthritis  of multiple sites with negative rheumatoid factor (HCC) 02/03/2016  . High risk medication use 02/03/2016  . 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 Essential hypertension 02/03/2016  . History of diabetes mellitus 02/03/2016  . History of diabetic neuropathy 02/03/2016  . Dyslipidemia 02/03/2016  . Vitamin D deficiency 02/03/2016  . Abnormality of gait 03/15/2013  . Right knee pain 03/15/2013  . History of total knee  replacement, bilateral 02/27/2013   Past Medical History:  Diagnosis Date  . Anxiety    takes no meds  . DVT (deep venous thrombosis) (HCC)   . GERD (gastroesophageal reflux disease)   . High cholesterol   . Hypertension   . Rheumatoid arthritis (HCC)   . Type II diabetes mellitus (HCC)     No family history on file.  Past Surgical History:  Procedure Laterality Date  . EYE SURGERY     bilateral cataracts  . TOTAL KNEE ARTHROPLASTY Right 02/28/2013  . TOTAL KNEE ARTHROPLASTY Right 02/27/2013   Procedure: TOTAL KNEE ARTHROPLASTY- knee;  Surgeon: Nadara Mustard, MD;  Location: [facility] OR;  Service: Orthopedics;  Laterality: Right;  Right Total Knee Arthroplasty  . TOTAL KNEE ARTHROPLASTY Left 07/04/2014   Procedure: LEFT TOTAL KNEE ARTHROPLASTY;  Surgeon: Nadara Mustard, MD;  Location: [facility] OR;  Service: Orthopedics;  Laterality: Left;   Social History   Occupational History  . Not on file.   Social History Main Topics  . Smoking status: Never Smoker  . Smokeless tobacco: Never Used  . Alcohol use No  . Drug use: No  . Sexual activity: Not Currently

## 2016-04-29 ENCOUNTER — Other Ambulatory Visit: Payer: Self-pay | Admitting: Rheumatology

## 2016-04-29 NOTE — Telephone Encounter (Signed)
02/03/16 06/30/16 Labs WNL 02/03/16 TB gold neg 11/23/15 Ok to refill per Dr Corliss Skains

## 2016-05-25 ENCOUNTER — Other Ambulatory Visit: Payer: Self-pay | Admitting: Rheumatology

## 2016-05-26 NOTE — Telephone Encounter (Signed)
Last Visit: 02/03/16 Next Visit: 06/30/16 Labs WNL 02/03/16  Left message to remind patient she is due for labs.   Okay to refill Rasuvo?

## 2016-05-26 NOTE — Telephone Encounter (Signed)
ok 

## 2016-06-01 ENCOUNTER — Other Ambulatory Visit: Payer: Self-pay | Admitting: *Deleted

## 2016-06-01 ENCOUNTER — Telehealth: Payer: Self-pay | Admitting: Rheumatology

## 2016-06-01 DIAGNOSIS — Z79899 Other long term (current) drug therapy: Secondary | ICD-10-CM

## 2016-06-01 NOTE — Telephone Encounter (Signed)
Labs released.  

## 2016-06-01 NOTE — Telephone Encounter (Signed)
Patient going to [facility] in [place] Saturday for labs. Please release orders. Call daughter if questions.

## 2016-06-13 ENCOUNTER — Other Ambulatory Visit: Payer: Self-pay | Admitting: *Deleted

## 2016-06-13 DIAGNOSIS — Z79899 Other long term (current) drug therapy: Secondary | ICD-10-CM

## 2016-06-20 ENCOUNTER — Other Ambulatory Visit: Payer: Self-pay

## 2016-06-20 ENCOUNTER — Telehealth: Payer: Self-pay | Admitting: Rheumatology

## 2016-06-20 ENCOUNTER — Encounter: Payer: Self-pay | Admitting: Rheumatology

## 2016-06-20 DIAGNOSIS — Z79899 Other long term (current) drug therapy: Secondary | ICD-10-CM | POA: Diagnosis not present

## 2016-06-20 LAB — COMPLETE METABOLIC PANEL WITH GFR
ALT: 21 U/L (ref 6–29)
AST: 23 U/L (ref 10–35)
Albumin: 3.7 g/dL (ref 3.6–5.1)
Alkaline Phosphatase: 102 U/L (ref 33–130)
BUN: 22 mg/dL (ref 7–25)
CO2: 27 mmol/L (ref 20–31)
Calcium: 9.1 mg/dL (ref 8.6–10.4)
Chloride: 102 mmol/L (ref 98–110)
Creat: 0.89 mg/dL (ref 0.50–0.99)
GFR, Est African American: 81 mL/min (ref >=60)
GFR, Est Non African American: 70 mL/min (ref >=60)
Glucose, Bld: 502 mg/dL (ref 65–99)
Potassium: 4.6 mmol/L (ref 3.5–5.3)
Sodium: 137 mmol/L (ref 135–146)
Total Bilirubin: 0.4 mg/dL (ref 0.2–1.2)
Total Protein: 7.2 g/dL (ref 6.1–8.1)

## 2016-06-20 LAB — CBC WITH DIFFERENTIAL/PLATELET
Basophils Absolute: 0 cells/uL (ref 0–200)
Basophils Relative: 0 %
Eosinophils Absolute: 603 cells/uL — ABNORMAL HIGH (ref 15–500)
Eosinophils Relative: 9 %
HCT: 37.7 % (ref 35.0–45.0)
Hemoglobin: 11.8 g/dL (ref 11.7–15.5)
Lymphocytes Relative: 25 %
Lymphs Abs: 1675 cells/uL (ref 850–3900)
MCH: 30.5 pg (ref 27.0–33.0)
MCHC: 31.3 g/dL — ABNORMAL LOW (ref 32.0–36.0)
MCV: 97.4 fL (ref 80.0–100.0)
MPV: 9.3 fL (ref 7.5–12.5)
Monocytes Absolute: 402 cells/uL (ref 200–950)
Monocytes Relative: 6 %
Neutro Abs: 4020 cells/uL (ref 1500–7800)
Neutrophils Relative %: 60 %
Platelets: 272 10*3/uL (ref 140–400)
RBC: 3.87 MIL/uL (ref 3.80–5.10)
RDW: 14.7 % (ref 11.0–15.0)
WBC: 6.7 10*3/uL (ref 3.8–10.8)

## 2016-06-20 NOTE — Telephone Encounter (Signed)
I reviewed patient's chart.  Noted we sent in Rasuvo on 05/25/16 for 84 day supply and we sent in Humira on 04/29/16 for 28 day supply with 2 refills.  I called [facility] and was informed that patient was mailed Rasuvo 84 day supply on 05/27/16 and she was mailed Humira 28 day supply on 05/31/16.  Patient has one Humira refill remaining.    I spoke to patient's daughter while she was in the office.  She confirms patient received Humira but is now out.  She is due for her next injection next week.  I informed her that there is a refill remaining at the pharmacy.  I advised patient's daughter to call [facility] to schedule delivery of refill.  She does not recall patient receiving Rasuvo prescription.  She knows patient has at least 1 Rasuvo pen at home for this week.  I advised her to follow up with patient on this to confirm whether she received the prescription and to call [facility] if they did not receive the prescription.  She voiced understanding.   [name], Pharm.D., BCPS, CPP Clinical Pharmacist Pager: [phone] Phone: [phone] 06/20/2016 5:07 PM

## 2016-06-20 NOTE — Progress Notes (Signed)
Non fasting Glucose level critically high At 502.   I was notified around 9:45pm I called at [phone] But could not reach patient.   Left message on machine.   Go to ER If symptomatic for hyperglycemia.  Call me back for further discussion.  Also, speak with pcp tomorrow to address high glucose levels.

## 2016-06-20 NOTE — Progress Notes (Signed)
Patient's blood glucose is 502. I called patient tonight and could reach only the answering service. I left a message for patient to call us back. Please contact patient as soon as possible. Also contact her PCP.

## 2016-06-20 NOTE — Telephone Encounter (Signed)
Patient here now for labs. Requesting samples of Humeria, and Rasvvo injection. Patient completely out.

## 2016-06-21 ENCOUNTER — Encounter (HOSPITAL_COMMUNITY): Payer: Self-pay

## 2016-06-21 ENCOUNTER — Telehealth: Payer: Self-pay | Admitting: Rheumatology

## 2016-06-21 ENCOUNTER — Emergency Department (HOSPITAL_COMMUNITY)
Admission: EM | Admit: 2016-06-21 | Discharge: 2016-06-21 | Disposition: A | Discharge status: Home / Self Care | Payer: BLUE CROSS/BLUE SHIELD | Attending: Emergency Medicine | Admitting: Emergency Medicine

## 2016-06-21 DIAGNOSIS — R739 Hyperglycemia, unspecified: Secondary | ICD-10-CM

## 2016-06-21 DIAGNOSIS — Z79899 Other long term (current) drug therapy: Secondary | ICD-10-CM | POA: Diagnosis not present

## 2016-06-21 DIAGNOSIS — F419 Anxiety disorder, unspecified: Secondary | ICD-10-CM | POA: Insufficient documentation

## 2016-06-21 DIAGNOSIS — Z7984 Long term (current) use of oral hypoglycemic drugs: Secondary | ICD-10-CM | POA: Diagnosis not present

## 2016-06-21 DIAGNOSIS — E1165 Type 2 diabetes mellitus with hyperglycemia: Secondary | ICD-10-CM | POA: Diagnosis not present

## 2016-06-21 DIAGNOSIS — I10 Essential (primary) hypertension: Secondary | ICD-10-CM | POA: Insufficient documentation

## 2016-06-21 DIAGNOSIS — Z86718 Personal history of other venous thrombosis and embolism: Secondary | ICD-10-CM | POA: Insufficient documentation

## 2016-06-21 DIAGNOSIS — E7800 Pure hypercholesterolemia, unspecified: Secondary | ICD-10-CM | POA: Diagnosis not present

## 2016-06-21 LAB — CBC
HCT: 35.4 % — ABNORMAL LOW (ref 36.0–46.0)
Hemoglobin: 11.4 g/dL — ABNORMAL LOW (ref 12.0–15.0)
MCH: 30.9 pg (ref 26.0–34.0)
MCHC: 32.2 g/dL (ref 30.0–36.0)
MCV: 95.9 fL (ref 78.0–100.0)
Platelets: 253 10*3/uL (ref 150–400)
RBC: 3.69 MIL/uL — ABNORMAL LOW (ref 3.87–5.11)
RDW: 14.4 % (ref 11.5–15.5)
WBC: 8.1 10*3/uL (ref 4.0–10.5)

## 2016-06-21 LAB — CBG MONITORING, ED: Glucose-Capillary: 334 mg/dL — ABNORMAL HIGH (ref 65–99)

## 2016-06-21 LAB — URINALYSIS, ROUTINE W REFLEX MICROSCOPIC
Bilirubin Urine: NEGATIVE
Glucose, UA: 50 mg/dL — AB
Ketones, ur: NEGATIVE mg/dL
Nitrite: NEGATIVE
Protein, ur: NEGATIVE mg/dL
Specific Gravity, Urine: 1.025 (ref 1.005–1.030)
pH: 5 (ref 5.0–8.0)

## 2016-06-21 LAB — BASIC METABOLIC PANEL
Anion gap: 7 (ref 5–15)
BUN: 18 mg/dL (ref 6–20)
CO2: 26 mmol/L (ref 22–32)
Calcium: 9.4 mg/dL (ref 8.9–10.3)
Chloride: 104 mmol/L (ref 101–111)
Creatinine, Ser: 0.77 mg/dL (ref 0.44–1.00)
GFR calc Af Amer: >60 mL/min (ref >60)
GFR calc non Af Amer: >60 mL/min (ref >60)
Glucose, Bld: 362 mg/dL — ABNORMAL HIGH (ref 65–99)
Potassium: 4.1 mmol/L (ref 3.5–5.1)
Sodium: 137 mmol/L (ref 135–145)

## 2016-06-21 NOTE — Telephone Encounter (Signed)
Patients daughter returning call from last pm. Mother is fine. Patient will be getting insulin today, and will be seeing her PCP.

## 2016-06-21 NOTE — ED Provider Notes (Signed)
Pt seen and evaluated. Hyperglycemic without acidosis. Non compliant with Trujeo.  Discussed compliance and long term effects. Pt with normal exam.   Rolland Porter, MD 06/21/16 1626

## 2016-06-21 NOTE — Telephone Encounter (Signed)
-----   Message from [place], [place] sent at 06/20/2016 10:11 PM EDT ----- Regarding: Glucose 502 Pls call or and tell her to f-u with pcp about critical high glucose levels .  HPI===> Non fasting Glucose level critically high At 502.   I was notified around 9:45pm I called at [phone] But could not reach patient.   Left message on machine.   Go to ER If symptomatic for hyperglycemia.  Call me back for further discussion.  Also, speak with pcp tomorrow to address high glucose levels.

## 2016-06-21 NOTE — Telephone Encounter (Signed)
Spoke with Ms. Connie Ross's daughter Connie Ross. She states that her mother will be getting her insulin today. Patient is not having any issues at this time. Patient does have a follow up appointment with her PCP later this month. Connie Ross to contact the PCP and make them aware of the current readings as they may want to see Connie Ross sooner. She verbalized understanding.

## 2016-06-21 NOTE — ED Provider Notes (Signed)
[facility] Provider Note   CSN: [id number] Arrival date & time: 06/21/16  1110     History   Chief Complaint Chief Complaint  Patient presents with  . Hyperglycemia    HPI Connie Ross is a 61 y.o. female.  The history is provided by the patient and a relative. No language interpreter was used.  Hyperglycemia  Blood sugar level PTA:  500 Severity:  Moderate Onset quality:  Gradual Duration: unknown, noted on routine labwork yesterday. Timing:  Intermittent Progression:  Improving Diabetes status:  Controlled with oral medications Context: noncompliance   Relieved by:  Nothing Ineffective treatments:  None tried Associated symptoms: no abdominal pain, no chest pain, no dysuria, no fatigue, no fever, no shortness of breath and no vomiting     Past Medical History:  Diagnosis Date  . Anxiety    takes no meds  . DVT (deep venous thrombosis) (HCC)   . GERD (gastroesophageal reflux disease)   . High cholesterol   . Hypertension   . Rheumatoid arthritis (HCC)   . Type II diabetes mellitus [facility])     Patient Active Problem List   Diagnosis Date Noted  . Pain in right foot 04/06/2016  . Rheumatoid arthritis of multiple sites with negative rheumatoid factor (HCC) 02/03/2016  . High risk medication use 02/03/2016  . 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 Essential hypertension 02/03/2016  . History of diabetes mellitus 02/03/2016  . History of diabetic neuropathy 02/03/2016  . Dyslipidemia 02/03/2016  . Vitamin D deficiency 02/03/2016  . Abnormality of gait 03/15/2013  . Right knee pain 03/15/2013  . History of total knee replacement, bilateral 02/27/2013    Past Surgical History:  Procedure Laterality Date  . EYE SURGERY     bilateral cataracts  . TOTAL KNEE ARTHROPLASTY Right 02/28/2013  . TOTAL KNEE ARTHROPLASTY Right 02/27/2013   Procedure: TOTAL KNEE ARTHROPLASTY- knee;  Surgeon: Nadara Mustard, MD;  Location: [facility] OR;  Service:  Orthopedics;  Laterality: Right;  Right Total Knee Arthroplasty  . TOTAL KNEE ARTHROPLASTY Left 07/04/2014   Procedure: LEFT TOTAL KNEE ARTHROPLASTY;  Surgeon: Nadara Mustard, MD;  Location: [facility] OR;  Service: Orthopedics;  Laterality: Left;    OB History    No data available       Home Medications    Prior to Admission medications   Medication Sig Start Date End Date Taking? Authorizing Provider  ACCU-CHEK FASTCLIX LANCETS MISC  01/28/16  Yes [provider]  acetaminophen (TYLENOL) 500 MG tablet Take 500 mg by mouth every 6 (six) hours as needed for mild pain.   Yes [provider]  Adalimumab (HUMIRA PEN) 40 MG/0.8ML PNKT Inject 40 mg into the skin every 14 (fourteen) days. 02/03/16  Yes Deveshwar, Janalyn Rouse, MD  aspirin 81 MG tablet Take 81 mg by mouth daily.   Yes [provider]  fexofenadine (ALLEGRA) 180 MG tablet Take 180 mg by mouth daily as needed for allergies.    Yes [provider]  folic acid (FOLVITE) 1 MG tablet Take 1 mg by mouth daily.   Yes [provider]  HYDROcodone-acetaminophen (NORCO/VICODIN) 5-325 MG per tablet Take 1 tablet by mouth every 6 (six) hours as needed for severe pain.  06/25/14  Yes [provider]  Insulin Glargine (TOUJEO MAX SOLOSTAR) 300 UNIT/ML SOPN Inject 10 Units into the skin every morning.   Yes [provider]  losartan-hydrochlorothiazide (HYZAAR) 50-12.5 MG per tablet Take 1 tablet by mouth daily.   Yes  [provider]  Methotrexate, PF, (RASUVO) 20 MG/0.4ML SOAJ INJECT 1 PEN WEEKLY Patient taking differently: Inject 20 mg into the muscle once a week. Sundays 02/03/16  Yes Deveshwar, Janalyn Rouse, MD  niacin (NIASPAN) 500 MG CR tablet Take 500 mg by mouth at bedtime.  06/23/14  Yes [provider]  pregabalin (LYRICA) 50 MG capsule Take 50 mg by mouth daily.   Yes [provider]  RASUVO 20 MG/0.4ML SOAJ INJECT ONE PEN (20 MG) SUBCUTANEOUSLY ONCE EVERY WEEK. 05/26/16   Yes Deveshwar, Janalyn Rouse, MD  sitaGLIPtin-metformin (JANUMET) 50-1000 MG tablet Take 1 tablet by mouth daily.    Yes [provider]  Adalimumab (HUMIRA PEN) 40 MG/0.8ML PNKT Inject 1 pen into the skin every 14 (fourteen) days. Patient not taking: Reported on 06/21/2016 04/29/16   Pollyann Savoy, MD  Methotrexate, PF, 20 MG/0.4ML SOAJ Inject 20 mg into the skin once a week. Patient not taking: Reported on 06/21/2016 02/03/16   Pollyann Savoy, MD  senna-docusate (SENOKOT S) 8.6-50 MG per tablet Take 1 tablet by mouth at bedtime as needed. Patient not taking: Reported on 06/21/2016 07/06/14   Tarry Kos, MD    Family History No family history on file.  Social History Social History  Substance Use Topics  . Smoking status: Never Smoker  . Smokeless tobacco: Never Used  . Alcohol use No     Allergies   Patient has no known allergies.   Review of Systems Review of Systems  Constitutional: Negative for chills, fatigue and fever.  HENT: Negative for ear pain and sore throat.   Eyes: Negative for pain and visual disturbance.  Respiratory: Negative for cough and shortness of breath.   Cardiovascular: Negative for chest pain and palpitations.  Gastrointestinal: Negative for abdominal pain and vomiting.  Genitourinary: Negative for dysuria and hematuria.  Musculoskeletal: Negative for arthralgias and back pain.  Skin: Negative for color change and rash.  Neurological: Negative for seizures, syncope and headaches.  All other systems reviewed and are negative.    Physical Exam Updated Vital Signs BP 109/79   Pulse 62   Temp 99.2 F (37.3 C) (Oral)   Resp 14   Ht 5\' 2"  (1.575 m)   Wt 104.3 kg (230 lb)   LMP  (LMP Unknown)   SpO2 96%   BMI 42.07 kg/m   Physical Exam  Constitutional: She appears well-developed. No distress.  HENT:  Head: Normocephalic and atraumatic.  Eyes: Conjunctivae are normal.  Neck: Neck supple.  Cardiovascular: Normal rate and regular  rhythm.   No murmur heard. Pulmonary/Chest: Effort normal and breath sounds normal. No respiratory distress.  Abdominal: Soft. There is no tenderness.  Musculoskeletal: She exhibits no edema.  Neurological: She is alert. No cranial nerve deficit. Coordination normal.  5/5 motor strength and intact sensation in all extremities. Finger-to-nose intact bilaterally  Skin: Skin is warm and dry.  Psychiatric: She has a normal mood and affect.  Nursing note and vitals reviewed.    ED Treatments / Results  Labs (all labs ordered are listed, but only abnormal results are displayed) Labs Reviewed  BASIC METABOLIC PANEL - Abnormal; Notable for the following:       Result Value   Glucose, Bld 362 (*)    All other components within normal limits  CBC - Abnormal; Notable for the following:    RBC 3.69 (*)    Hemoglobin 11.4 (*)    HCT 35.4 (*)    All other components within normal limits  URINALYSIS, ROUTINE W REFLEX MICROSCOPIC - Abnormal; Notable for the following:    APPearance HAZY (*)    Glucose, UA 50 (*)    Hgb urine dipstick SMALL (*)    Leukocytes, UA TRACE (*)    Bacteria, UA MANY (*)    Squamous Epithelial / LPF 0-5 (*)    All other components within normal limits  CBG MONITORING, ED - Abnormal; Notable for the following:    Glucose-Capillary 334 (*)    All other components within normal limits    EKG  EKG Interpretation None       Radiology No results found.  Procedures Procedures (including critical care time)  Medications Ordered in ED Medications - No data to display   Initial Impression / Assessment and Plan / ED Course  I have reviewed the triage vital signs and the nursing notes.  Pertinent labs & imaging results that were available during my care of the patient were reviewed by me and considered in my medical decision making (see chart for details).     61 year old female history of T2 DM, RA, HTN, and obesity who presents with asymptomatic  hyperglycemia.  She is accompanied today by her daughters, who serve as interpreters.  Patient declines formal hospital interpreter. Patient was evaluated yesterday for routine lab work by Rheumatology for RA. Her glucose on CMP was noted to be 502. She was instructed to present to ED for further evaluation. Patient denies current symptoms today.  Her current prescribed DM regimen is Janumet and Toujeo. She is compliant with Janumet. Toujeo was started in Fall 2017, but patient has been noncompliant with this medication as she is asymptomatic. She does not like to and does not check her sugars at home, only finding out glucose during labwork visits. She missed a PCP appointment recently. Patient denies headaches, fevers, vomiting, diarrhea, chest pain, vomiting, dysuria, or rash.  AF, VSS. No focal neuro deficits. Lungs CTAB. Abdomen soft, benign throughout.  BMP showing Glucose 362. Pt with otherwise asymptomatic hyperglycemia. Spoke extensively with patient and family regarding long term systemic effects of uncontrolled hyperglycemia. She and family voice understanding. Pt states she will begin taking her Toujeo. She is stable for continued followup with PCP.  Return precautions provided for worsening symptoms. Pt will f/u with PCP at first availability. Pt verbalized agreement with plan.  Pt care d/w Dr. Fayrene Fearing  Final Clinical Impressions(s) / ED Diagnoses   Final diagnoses:  Hyperglycemia    New Prescriptions Discharge Medication List as of 06/21/2016  4:24 PM       Connie Ross, Homero Fellers, MD 06/23/16 0143    Rolland Porter, MD 07/03/16 2337

## 2016-06-21 NOTE — Discharge Instructions (Signed)
Please check your sugar before meals and maintain diary/log of sugars. Please continue janumet and resume your tuojeo as prescribed by primary doctor. Please followup with your primary doctor as scheduled

## 2016-06-21 NOTE — ED Triage Notes (Signed)
Pt presents to the ed for her blood sugar being elevated stated it was 500 last night and 400 this morning. She was having diarrhea on Saturday and denies any symptoms at all today.  Pt is not on insulin she takes a pill, unknown, to control blood sugar. Pt daughter translated for triage, pt refused hospital provided translator

## 2016-06-23 NOTE — Telephone Encounter (Signed)
I called patient to follow up on her Humira and Rasuvo prescriptions.  Patient's daughter, [name], answered and said her sister [name] did not mention anything about the prescriptions to her.  She will check with [name] to make sure Humira was ordered from the pharmacy and check on her Rasuvo prescription and will call us if there is any concerns.  Patient is scheduled for follow up next week on 06/30/16.    [name], Pharm.D., BCPS, CPP Clinical Pharmacist Pager: [phone] Phone: [phone] 06/23/2016 10:48 AM

## 2016-06-30 ENCOUNTER — Ambulatory Visit: Payer: BLUE CROSS/BLUE SHIELD | Admitting: Rheumatology

## 2016-07-01 ENCOUNTER — Encounter: Payer: Self-pay | Admitting: Rheumatology

## 2016-07-01 ENCOUNTER — Telehealth (INDEPENDENT_AMBULATORY_CARE_PROVIDER_SITE_OTHER): Payer: Self-pay | Admitting: Orthopedic Surgery

## 2016-07-01 ENCOUNTER — Ambulatory Visit (INDEPENDENT_AMBULATORY_CARE_PROVIDER_SITE_OTHER): Payer: BLUE CROSS/BLUE SHIELD | Admitting: Rheumatology

## 2016-07-01 VITALS — BP 118/62 | Resp 18 | Ht 62.0 in | Wt 218.0 lb

## 2016-07-01 DIAGNOSIS — Z96653 Presence of artificial knee joint, bilateral: Secondary | ICD-10-CM | POA: Diagnosis not present

## 2016-07-01 DIAGNOSIS — M0609 Rheumatoid arthritis without rheumatoid factor, multiple sites: Secondary | ICD-10-CM

## 2016-07-01 DIAGNOSIS — Z8639 Personal history of other endocrine, nutritional and metabolic disease: Secondary | ICD-10-CM

## 2016-07-01 DIAGNOSIS — Z79899 Other long term (current) drug therapy: Secondary | ICD-10-CM | POA: Diagnosis not present

## 2016-07-01 DIAGNOSIS — M722 Plantar fascial fibromatosis: Secondary | ICD-10-CM | POA: Diagnosis not present

## 2016-07-01 MED ORDER — ADALIMUMAB 40 MG/0.8ML ~~LOC~~ AJKT: 40.0000 mg | AUTO-INJECTOR | SUBCUTANEOUS | 2 refills | Status: DC

## 2016-07-01 NOTE — Telephone Encounter (Signed)
Pt requesting copy of x-ray of foot for upcoming apt.

## 2016-07-01 NOTE — Progress Notes (Signed)
Rheumatology Medication Review by a Pharmacist Does the patient feel that his/her medications are working for him/her?  Yes Has the patient been experiencing any side effects to the medications prescribed?  No Does the patient have any problems obtaining medications?  Yes  Issues to address at subsequent visits:    Pharmacist comments:  Connie Ross is a pleasant 61 yo F who presents for follow up of rheumatoid arthritis.  Connie Ross previously reported having difficulty getting her Humira and Rasuvo prescription from the pharmacy.  Today, she reports Humira is scheduled to be delivered next week.  This is her last refill.  She also confirms she received the Rasuvo 84 day supply from the pharmacy.  I advised her to always contact [facility] when she is down to her last pen of Rasuvo or Humira to schedule next refill.  Patient voiced understanding and denies any further questions regarding her medications at this time.  Most recent TB Gold was negative on 08/01/15.  Will recommend TB Gold with next labs.    [name], Pharm.D., BCPS, CPP Clinical Pharmacist Pager: [phone] Phone: [phone] 07/01/2016 9:26 AM

## 2016-07-01 NOTE — Progress Notes (Signed)
Office Visit Note  Patient: Connie Ross             Date of Birth: 1955-04-27           MRN: [id number]             PCP: Iona Beard, MD Referring: Iona Beard, MD Visit Date: 07/01/2016 Occupation: @GUAROCC @    Subjective:  Pain of the Right Foot (In the morning gets better throughout the day ) and Medication Management   History of Present Illness: Connie Ross is a 61 y.o. female  Whose last seen in our office on 02/25/2016 for rheumatoid arthritis. During that visit, patient reported that she was out of Humira and Rasuvo for about 1-1/2 months. She was having some insurance issues. As a result of being off the medication, she ended up having pain and swelling in her hands and feet. She also has underlying low back pain from disc disease. She is status post total knee replacement and doing very well with those.  In addition, recently she had her labs done and her nonfasting glucose was elevated to 502. I took the call and notify the patient by phone to go to the emergency department and coordinate with the PCP because of risk of DKA. I was unable to reach the patient and had to leave a message on the machine and asked them to call me back should they have any questions or concerns. Patient has other glucose values that are in the 300s. We emphasized to the patient the importance of controlling her diabetes better.  Today, patient reports that she is doing well with her joints. There were confused about insurance and obtaining their Humira and Rasuvo. We did discuss the importance of discussing with her pharmacy if they have any refills left. We've also discussed to contact our office should they have need for the medication after they've contacted their pharmacy. I reminded the patient the importance of keeping office visits as well as doing labs on regular basis so that we can refill the medication when they're due. Patient understands and is agreeable. She is  here with her daughter who translates for [place].   Only new complaint patient has is that she's been having severe pain in the heel of her foot secondary to plantar fasciitis. We discussed conservative treatments including exercise, Voltaren gel, weight loss. If all of this conservative therapy fails, we will do injection in the future. We did discuss inserts at the shoe market the patient should try first.  Patient is on Humira every 2 weeks Rasuvo 20 mg every week Folic acid 2 mg every day   Activities of Daily Living:  Patient reports morning stiffness for 15 minutes.   Patient Reports nocturnal pain.  Difficulty dressing/grooming: Denies Difficulty climbing stairs: Reports Difficulty getting out of chair: Reports Difficulty using hands for taps, buttons, cutlery, and/or writing: Denies   Review of Systems  Constitutional: Negative for fatigue.  HENT: Negative for mouth sores and mouth dryness.   Eyes: Negative for dryness.  Respiratory: Negative for shortness of breath.   Gastrointestinal: Negative for constipation and diarrhea.  Musculoskeletal: Negative for myalgias and myalgias.  Skin: Negative for sensitivity to sunlight.  Psychiatric/Behavioral: Negative for decreased concentration and sleep disturbance.    PMFS History:  Patient Active Problem List   Diagnosis Date Noted  . Pain in right foot 04/06/2016  . Rheumatoid arthritis of multiple sites with negative rheumatoid factor ([place]) 02/03/2016  . High risk medication use 02/03/2016  .  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 Essential hypertension 02/03/2016  . History of diabetes mellitus 02/03/2016  . History of diabetic neuropathy 02/03/2016  . Dyslipidemia 02/03/2016  . Vitamin D deficiency 02/03/2016  . Abnormality of gait 03/15/2013  . Right knee pain 03/15/2013  . History of total knee replacement, bilateral 02/27/2013    Past Medical History:  Diagnosis Date  . Anxiety    takes no meds  . DVT  (deep venous thrombosis) ([place])   . GERD (gastroesophageal reflux disease)   . High cholesterol   . Hypertension   . Rheumatoid arthritis ([place])   . Type II diabetes mellitus ([place])     No family history on file. Past Surgical History:  Procedure Laterality Date  . EYE SURGERY     bilateral cataracts  . TOTAL KNEE ARTHROPLASTY Right 02/28/2013  . TOTAL KNEE ARTHROPLASTY Right 02/27/2013   Procedure: TOTAL KNEE ARTHROPLASTY- knee;  Surgeon: Newt Minion, MD;  Location: [place];  Service: Orthopedics;  Laterality: Right;  Right Total Knee Arthroplasty  . TOTAL KNEE ARTHROPLASTY Left 07/04/2014   Procedure: LEFT TOTAL KNEE ARTHROPLASTY;  Surgeon: Newt Minion, MD;  Location: [place];  Service: Orthopedics;  Laterality: Left;   Social History   Social History Narrative  . No narrative on file     Objective: Vital Signs: BP 118/62   Resp 18   Ht 5' 2"  (1.575 m)   Wt 218 lb (98.9 kg)   LMP  (LMP Unknown)   BMI 39.87 kg/m    Physical Exam  Constitutional: She is oriented to person, place, and time. She appears well-developed and well-nourished.  HENT:  Head: Normocephalic and atraumatic.  Eyes: EOM are normal. Pupils are equal, round, and reactive to light.  Cardiovascular: Normal rate, regular rhythm and normal heart sounds.  Exam reveals no gallop and no friction rub.   No murmur heard. Pulmonary/Chest: Effort normal and breath sounds normal. She has no wheezes. She has no rales.  Abdominal: Soft. Bowel sounds are normal. She exhibits no distension. There is no tenderness. There is no guarding. No hernia.  Musculoskeletal: Normal range of motion. She exhibits no edema, tenderness or deformity.  Lymphadenopathy:    She has no cervical adenopathy.  Neurological: She is alert and oriented to person, place, and time. Coordination normal.  Skin: Skin is warm and dry. Capillary refill takes less than 2 seconds. No rash noted.  Psychiatric: She has a normal mood and affect. Her behavior is  normal.  Nursing note and vitals reviewed.    Musculoskeletal Exam:  Full range of motion of all joints Grip strength is equal and strong bilaterally Fibromyalgia tender points are absent  CDAI Exam: CDAI Homunculus Exam:   Joint Counts:  CDAI Tender Joint count: 0 CDAI Swollen Joint count: 0  Global Assessments:  Patient Global Assessment: 8 Provider Global Assessment: 8  CDAI Calculated Score: 16  No synovitis on examination. History of contractures at the DIP PIP joints of most digits but no inflammation, swelling.  Investigation: No additional findings. Admission on 06/21/2016, Discharged on 06/21/2016  Component Date Value Ref Range Status  . Sodium 06/21/2016 137  135 - 145 mmol/L Final  . Potassium 06/21/2016 4.1  3.5 - 5.1 mmol/L Final  . Chloride 06/21/2016 104  101 - 111 mmol/L Final  . CO2 06/21/2016 26  22 - 32 mmol/L Final  . Glucose, Bld 06/21/2016 362* 65 - 99 mg/dL Final  . BUN 06/21/2016 18  6 -  20 mg/dL Final  . Creatinine, Ser 06/21/2016 0.77  0.44 - 1.00 mg/dL Final  . Calcium 06/21/2016 9.4  8.9 - 10.3 mg/dL Final  . GFR calc non Af Amer 06/21/2016 >60  >60 mL/min Final  . GFR calc Af Amer 06/21/2016 >60  >60 mL/min Final   Comment: (NOTE) The eGFR has been calculated using the CKD EPI equation. This calculation has not been validated in all clinical situations. eGFR's persistently <60 mL/min signify possible Chronic Kidney Disease.   . Anion gap 06/21/2016 7  5 - 15 Final  . WBC 06/21/2016 8.1  4.0 - 10.5 K/uL Final  . RBC 06/21/2016 3.69* 3.87 - 5.11 MIL/uL Final  . Hemoglobin 06/21/2016 11.4* 12.0 - 15.0 g/dL Final  . HCT 06/21/2016 35.4* 36.0 - 46.0 % Final  . MCV 06/21/2016 95.9  78.0 - 100.0 fL Final  . MCH 06/21/2016 30.9  26.0 - 34.0 pg Final  . MCHC 06/21/2016 32.2  30.0 - 36.0 g/dL Final  . RDW 06/21/2016 14.4  11.5 - 15.5 % Final  . Platelets 06/21/2016 253  150 - 400 K/uL Final  . Color, Urine 06/21/2016 YELLOW  YELLOW Final  .  APPearance 06/21/2016 HAZY* CLEAR Final  . Specific Gravity, Urine 06/21/2016 1.025  1.005 - 1.030 Final  . pH 06/21/2016 5.0  5.0 - 8.0 Final  . Glucose, UA 06/21/2016 50* NEGATIVE mg/dL Final  . Hgb urine dipstick 06/21/2016 SMALL* NEGATIVE Final  . Bilirubin Urine 06/21/2016 NEGATIVE  NEGATIVE Final  . Ketones, ur 06/21/2016 NEGATIVE  NEGATIVE mg/dL Final  . Protein, ur 06/21/2016 NEGATIVE  NEGATIVE mg/dL Final  . Nitrite 06/21/2016 NEGATIVE  NEGATIVE Final  . Leukocytes, UA 06/21/2016 TRACE* NEGATIVE Final  . RBC / HPF 06/21/2016 0-5  0 - 5 RBC/hpf Final  . WBC, UA 06/21/2016 6-30  0 - 5 WBC/hpf Final  . Bacteria, UA 06/21/2016 MANY* NONE SEEN Final  . Squamous Epithelial / LPF 06/21/2016 0-5* NONE SEEN Final  . Mucous 06/21/2016 PRESENT   Final  . Hyaline Casts, UA 06/21/2016 PRESENT   Final  . Glucose-Capillary 06/21/2016 334* 65 - 99 mg/dL Final  Orders Only on 06/20/2016  Component Date Value Ref Range Status  . WBC 06/20/2016 6.7  3.8 - 10.8 K/uL Final  . RBC 06/20/2016 3.87  3.80 - 5.10 MIL/uL Final  . Hemoglobin 06/20/2016 11.8  11.7 - 15.5 g/dL Final  . HCT 06/20/2016 37.7  35.0 - 45.0 % Final  . MCV 06/20/2016 97.4  80.0 - 100.0 fL Final  . MCH 06/20/2016 30.5  27.0 - 33.0 pg Final  . MCHC 06/20/2016 31.3* 32.0 - 36.0 g/dL Final  . RDW 06/20/2016 14.7  11.0 - 15.0 % Final  . Platelets 06/20/2016 272  140 - 400 K/uL Final  . MPV 06/20/2016 9.3  7.5 - 12.5 fL Final  . Neutro Abs 06/20/2016 4020  1,500 - 7,800 cells/uL Final  . Lymphs Abs 06/20/2016 1675  850 - 3,900 cells/uL Final  . Monocytes Absolute 06/20/2016 402  200 - 950 cells/uL Final  . Eosinophils Absolute 06/20/2016 603* 15 - 500 cells/uL Final  . Basophils Absolute 06/20/2016 0  0 - 200 cells/uL Final  . Neutrophils Relative % 06/20/2016 60  % Final  . Lymphocytes Relative 06/20/2016 25  % Final  . Monocytes Relative 06/20/2016 6  % Final  . Eosinophils Relative 06/20/2016 9  % Final  . Basophils Relative  06/20/2016 0  % Final  . Smear Review 06/20/2016 Criteria  for review not met   Final  . Sodium 06/20/2016 137  135 - 146 mmol/L Final  . Potassium 06/20/2016 4.6  3.5 - 5.3 mmol/L Final  . Chloride 06/20/2016 102  98 - 110 mmol/L Final  . CO2 06/20/2016 27  20 - 31 mmol/L Final  . Glucose, Bld 06/20/2016 502* 65 - 99 mg/dL Final   Result repeated and verified.  . BUN 06/20/2016 22  7 - 25 mg/dL Final  . Creat 06/20/2016 0.89  0.50 - 0.99 mg/dL Final   Comment:   For patients > or = 61 years of age: The upper reference limit for Creatinine is approximately 13% higher for people identified as African-American.     . Total Bilirubin 06/20/2016 0.4  0.2 - 1.2 mg/dL Final  . Alkaline Phosphatase 06/20/2016 102  33 - 130 U/L Final  . AST 06/20/2016 23  10 - 35 U/L Final  . ALT 06/20/2016 21  6 - 29 U/L Final  . Total Protein 06/20/2016 7.2  6.1 - 8.1 g/dL Final  . Albumin 06/20/2016 3.7  3.6 - 5.1 g/dL Final  . Calcium 06/20/2016 9.1  8.6 - 10.4 mg/dL Final  . GFR, Est African American 06/20/2016 81  >=60 mL/min Final  . GFR, Est Non African American 06/20/2016 70  >=60 mL/min Final  Office Visit on 02/03/2016  Component Date Value Ref Range Status  . WBC 02/03/2016 8.6  3.8 - 10.8 K/uL Final  . RBC 02/03/2016 3.90  3.80 - 5.10 MIL/uL Final  . Hemoglobin 02/03/2016 11.8  11.7 - 15.5 g/dL Final  . HCT 02/03/2016 36.6  35.0 - 45.0 % Final  . MCV 02/03/2016 93.8  80.0 - 100.0 fL Final  . MCH 02/03/2016 30.3  27.0 - 33.0 pg Final  . MCHC 02/03/2016 32.2  32.0 - 36.0 g/dL Final  . RDW 02/03/2016 13.7  11.0 - 15.0 % Final  . Platelets 02/03/2016 308  140 - 400 K/uL Final  . MPV 02/03/2016 9.6  7.5 - 12.5 fL Final  . Neutro Abs 02/03/2016 5418  1,500 - 7,800 cells/uL Final  . Lymphs Abs 02/03/2016 1806  850 - 3,900 cells/uL Final  . Monocytes Absolute 02/03/2016 430  200 - 950 cells/uL Final  . Eosinophils Absolute 02/03/2016 860* 15 - 500 cells/uL Final  . Basophils Absolute 02/03/2016  86  0 - 200 cells/uL Final  . Neutrophils Relative % 02/03/2016 63  % Final  . Lymphocytes Relative 02/03/2016 21  % Final  . Monocytes Relative 02/03/2016 5  % Final  . Eosinophils Relative 02/03/2016 10  % Final  . Basophils Relative 02/03/2016 1  % Final  . Smear Review 02/03/2016 Criteria for review not met   Final  . Sodium 02/03/2016 139  135 - 146 mmol/L Final  . Potassium 02/03/2016 4.3  3.5 - 5.3 mmol/L Final  . Chloride 02/03/2016 101  98 - 110 mmol/L Final  . CO2 02/03/2016 27  20 - 31 mmol/L Final  . Glucose, Bld 02/03/2016 302* 65 - 99 mg/dL Final  . BUN 02/03/2016 15  7 - 25 mg/dL Final  . Creat 02/03/2016 0.71  0.50 - 0.99 mg/dL Final   Comment:   For patients > or = 61 years of age: The upper reference limit for Creatinine is approximately 13% higher for people identified as African-American.     . Total Bilirubin 02/03/2016 0.3  0.2 - 1.2 mg/dL Final  . Alkaline Phosphatase 02/03/2016 88  33 - 130 U/L  Final  . AST 02/03/2016 27  10 - 35 U/L Final  . ALT 02/03/2016 25  6 - 29 U/L Final  . Total Protein 02/03/2016 6.9  6.1 - 8.1 g/dL Final  . Albumin 02/03/2016 3.6  3.6 - 5.1 g/dL Final  . Calcium 02/03/2016 9.3  8.6 - 10.4 mg/dL Final  . GFR, Est African American 02/03/2016 >89  >=60 mL/min Final  . GFR, Est Non African American 02/03/2016 >89  >=60 mL/min Final     Imaging: No results found.  Speciality Comments: No specialty comments available.    Procedures:  No procedures performed Allergies: Patient has no known allergies.   Assessment / Plan:     Visit Diagnoses: Rheumatoid arthritis of multiple sites with negative rheumatoid factor (HCC)  High risk medication use  History of total knee replacement, bilateral  History of diabetes mellitus  Plantar fasciitis of right foot   Plan: #1: Rheumatoid arthritis with positive rheumatoid factor. Doing well currently. Had trouble with getting up medication due to misunderstanding and lack of labs as  well as insurance issues. We have discussed remedies to mitigate those problems future so she can continue medications.  #2: High risk prescription. Humira every 2 weeks Rasuvo 20 mg per week Folic acid 2 mg per day Currently patient is responding well to the Humira Rasuvo treatment.  Recent labs are up-to-date She will need labs again in 2 months that include CBC with differential and CMP with GFR  Please come back and get your standing labs in August 2018, October 2018, December 2018   #3: Recent nonfasting glucose showed 502 levels. Patient was made aware of the importance of monitoring sugars and keeping them at a lower level and coordinating with the PCP. The daughter reports that 2 days blood sugar was about 198. This is the best it's been in a while. There taking more efforts to manage diabetes.  #4: Right foot plantar fasciitis. Patient was instructed to get inserts for her shoes Will use Voltaren gel We'll do exercise  #5: Return to clinic in 4 months  Orders: No orders of the defined types were placed in this encounter.  No orders of the defined types were placed in this encounter.   Face-to-face time spent with patient was 30 minutes. 50% of time was spent in counseling and coordination of care.  Follow-Up Instructions: Return in about 4 months (around 10/31/2016) for RA,humira q 2 wks, rasuvo 89m, iddm, rt plantar fasciitis.   NEliezer Lofts PA-C  Note - This record has been created using [employer]  Chart creation errors have been sought, but may not always  have been located. Such creation errors do not reflect on  the standard of medical care.

## 2016-07-01 NOTE — Patient Instructions (Addendum)
Standing Labs We placed an order today for your standing lab work.    Please come back and get your standing labs in August 2018, October 2018, December 2018  We have open lab Monday through Friday from 8:30-11:30 AM and 1:30-4 PM at the office of Dr. Arbutus Ped, PA.   The office is located at [address], [place], [place] [address] No appointment is necessary.   Labs are drawn by [facility].  You may receive a bill from [facility] for your lab work. If you have any questions regarding directions or hours of operation,  please call [phone].     Fascitis plantar con rehabilitacin Plantar Fasciitis Rehab Consulte al mdico qu ejercicios son seguros para usted. Haga los ejercicios exactamente como se lo haya indicado el mdico y gradelos como se lo hayan indicado. Es normal sentir tirantez, tensin, presin o molestias leves mientras hace estos ejercicios, pero debe detenerse de inmediato si siente dolor repentino o si el dolor empeora. No comience a hacer estos ejercicios hasta que se lo indique el mdico. EJERCICIOS DE [employer] Y AMPLITUD DE MOVIMIENTOS Estos ejercicios calientan los msculos y las articulaciones, y mejoran la movilidad y la flexibilidad del pie. Adems, ayudan a Engineer, materials. EjercicioA: Estiramiento de la fascia plantar 1. Sintese con la pierna izquierda/derecha cruzada sobre la rodilla opuesta. 2. Sostenga el taln con Edison Simon, con el pulgar cerca del arco. Con la otra mano, sostenga los dedos de los pies y empjelos con [place]. Debe sentir un estiramiento en la parte de abajo de los dedos o del pie, o de [place]. 3. Mantenga esta posicin durante _________ segundos. 4. Afloje lentamente los dedos y vuelva a la posicin inicial. Repita __________ veces. Realice este ejercicio __________ veces al da. EjercicioB: Msculos gemelos, de pie 1. Prese con las [employer] pared. 2. Extienda la pierna  izquierda/derecha hacia atrs y flexione ligeramente la rodilla de la pierna de adelante. 3. Mantenga los talones en el suelo y la rodilla de atrs extendida, y pase el peso hacia la pared, sin arquear la espalda. Debe sentir un estiramiento suave en la pantorrilla izquierda/derecha. 4. Mantenga esta posicin durante ___________ segundos. Repita __________ veces. Realice este ejercicio __________ veces al da. EjercicioC: Msculo sleo, de pie 1. Prese con las [employer] pared. 2. Extienda la pierna izquierda/derecha hacia atrs y flexione ligeramente la rodilla de la pierna de adelante. 3. Mantenga los talones apoyados en el suelo, flexione la rodilla de atrs y lleve el peso ligeramente a la pierna de atrs. Debe sentir un estiramiento suave en la parte profunda de la pantorrilla. 4. Mantenga esta posicin durante ___________ segundos. Repita __________ veces. Realice este ejercicio __________ veces al da. EjercicioD: Msculos gemelos y sleo, de pie 1. Prese sobre un escaln apoyando solo la regin metatarsiana de su pie derecho/izquierdo. La parte metatarsiana de la planta del pie es la superficie sobre la que caminamos, justo debajo de los dedos. 2. Mantenga el otro pie apoyado con firmeza en el mismo escaln. 3. Sostngase de la pared o de una baranda para mantener el equilibrio. 4. Levante lentamente el [employer], y permita que el peso del cuerpo presione el taln sobre el borde del escaln. Debe sentir un estiramiento en la pantorrilla izquierda/derecha. 5. Mantenga esta posicin durante ___________ segundos. 6. Vuelva a poner ambos pies sobre el escaln. 7. Repita este ejercicio con una leve flexin en la rodilla izquierda/derecha. Reptalo __________ veces con la rodilla  izquierda/derecha extendida y __________ veces con la rodilla izquierda/derecha flexionada. Realice este ejercicio __________ veces al da. EJERCICIO DE EQUILIBRIO Este ejercicio aumenta el equilibrio y  el control de la fuerza del arco, para ayudar a reducir la presin sobre la fascia plantar. EjercicioE: Pararse en una sola pierna 1. Sin calzado, prese cerca de una baranda o [place]. Puede sostenerse de la baranda o del marco de la puerta, segn lo necesite. 2. Prese sobre el pie izquierdo/derecho. Sin despegar el dedo gordo del suelo, intente mantener el arco levantado. No deje que el pie se vaya hacia adentro. 3. Mantenga esta posicin durante ___________ segundos. 4. Si este ejercicio es [address], puede intentar [employer] con los ojos cerrados o parado sobre [place]. Repita __________ veces. Realice este ejercicio __________ veces al da. Esta informacin no tiene Theme park manager el consejo del mdico. Asegrese de hacerle al mdico cualquier pregunta que tenga. Document Released: 10/06/2005 Document Revised: 05/06/2014 Document Reviewed: 11/03/2014 Elsevier Interactive Patient Education  2018 Elsevier Inc.   ==================================== Plantar Fasciitis Rehab Ask your health care provider which exercises are safe for you. Do exercises exactly as told by your health care provider and adjust them as directed. It is normal to feel mild stretching, pulling, tightness, or discomfort as you do these exercises, but you should stop right away if you feel sudden pain or your pain gets worse. Do not begin these exercises until told by your health care provider. Stretching and range of motion exercises These exercises warm up your muscles and joints and improve the movement and flexibility of your foot. These exercises also help to relieve pain. Exercise A: Plantar fascia stretch  1. Sit with your left / right leg crossed over your opposite knee. 2. Hold your heel with one hand with that thumb near your arch. With your other hand, hold your toes and gently pull them back toward the top of your foot. You should feel a stretch on the bottom of your toes or your foot or  both. 3. Hold this stretch for__________ seconds. 4. Slowly release your toes and return to the starting position. Repeat __________ times. Complete this exercise __________ times a day. Exercise B: Gastroc, standing  1. Stand with your hands against a wall. 2. Extend your left / right leg behind you, and bend your front knee slightly. 3. Keeping your heels on the floor and keeping your back knee straight, shift your weight toward the wall without arching your back. You should feel a gentle stretch in your left / right calf. 4. Hold this position for __________ seconds. Repeat __________ times. Complete this exercise __________ times a day. Exercise C: Soleus, standing 1. Stand with your hands against a wall. 2. Extend your left / right leg behind you, and bend your front knee slightly. 3. Keeping your heels on the floor, bend your back knee and slightly shift your weight over the back leg. You should feel a gentle stretch deep in your calf. 4. Hold this position for __________ seconds. Repeat __________ times. Complete this exercise __________ times a day. Exercise D: Gastrocsoleus, standing 1. Stand with the ball of your left / right foot on a step. The ball of your foot is on the walking surface, right under your toes. 2. Keep your other foot firmly on the same step. 3. Hold onto the wall or a railing for balance. 4. Slowly lift your other foot, allowing your body weight to press your heel down over the edge of  the step. You should feel a stretch in your left / right calf. 5. Hold this position for __________ seconds. 6. Return both feet to the step. 7. Repeat this exercise with a slight bend in your left / right knee. Repeat __________ times with your left / right knee straight and __________ times with your left / right knee bent. Complete this exercise __________ times a day. Balance exercise This exercise builds your balance and strength control of your arch to help take pressure  off your plantar fascia. Exercise E: Single leg stand 1. Without shoes, stand near a railing or in a doorway. You may hold onto the railing or door frame as needed. 2. Stand on your left / right foot. Keep your big toe down on the floor and try to keep your arch lifted. Do not let your foot roll inward. 3. Hold this position for __________ seconds. 4. If this exercise is too easy, you can try it with your eyes closed or while standing on a pillow. Repeat __________ times. Complete this exercise __________ times a day. This information is not intended to replace advice given to you by your health care provider. Make sure you discuss any questions you have with your health care provider. Document Released: 12/20/2004 Document Revised: 08/25/2015 Document Reviewed: 11/03/2014 Elsevier Interactive Patient Education  2018 [employer].

## 2016-08-02 DIAGNOSIS — Z6841 Body Mass Index (BMI) 40.0 and over, adult: Secondary | ICD-10-CM | POA: Diagnosis not present

## 2016-08-02 DIAGNOSIS — E669 Obesity, unspecified: Secondary | ICD-10-CM | POA: Diagnosis not present

## 2016-08-02 DIAGNOSIS — M069 Rheumatoid arthritis, unspecified: Secondary | ICD-10-CM | POA: Diagnosis not present

## 2016-08-02 DIAGNOSIS — E1165 Type 2 diabetes mellitus with hyperglycemia: Secondary | ICD-10-CM | POA: Diagnosis not present

## 2016-08-08 ENCOUNTER — Other Ambulatory Visit: Payer: Self-pay | Admitting: Rheumatology

## 2016-08-08 NOTE — Telephone Encounter (Signed)
Last Visit: 07/01/16 Next Visit: 11/04/16 Labs: 06/20/16 Elevated blood sugar, rest WNL  Okay to refill per Dr. Corliss Skains

## 2016-10-05 ENCOUNTER — Other Ambulatory Visit: Payer: Self-pay | Admitting: Rheumatology

## 2016-10-06 NOTE — Telephone Encounter (Signed)
Last Visit: 07/01/16 Next Visit: 11/04/16 Labs: 06/20/16 Elevated glucose  TB Gold: 11/23/15 Neg  Left message with patient's daughter to advise labs are due to be updated.   Okay to refill 30 day supply Humira?

## 2016-10-06 NOTE — Telephone Encounter (Signed)
ok 

## 2016-10-10 ENCOUNTER — Other Ambulatory Visit: Payer: Self-pay

## 2016-10-10 DIAGNOSIS — Z79899 Other long term (current) drug therapy: Secondary | ICD-10-CM | POA: Diagnosis not present

## 2016-10-11 LAB — COMPLETE METABOLIC PANEL WITH GFR
AG Ratio: 1.1 (calc) (ref 1.0–2.5)
ALT: 24 U/L (ref 6–29)
AST: 23 U/L (ref 10–35)
Albumin: 3.6 g/dL (ref 3.6–5.1)
Alkaline phosphatase (APISO): 97 U/L (ref 33–130)
BUN: 21 mg/dL (ref 7–25)
CO2: 31 mmol/L (ref 20–32)
Calcium: 9.6 mg/dL (ref 8.6–10.4)
Chloride: 101 mmol/L (ref 98–110)
Creat: 0.77 mg/dL (ref 0.50–0.99)
GFR, Est African American: 97 mL/min/{1.73_m2} (ref >=60)
GFR, Est Non African American: 83 mL/min/{1.73_m2} (ref >=60)
Globulin: 3.3 g/dL (calc) (ref 1.9–3.7)
Glucose, Bld: 337 mg/dL — ABNORMAL HIGH (ref 65–99)
Potassium: 4.1 mmol/L (ref 3.5–5.3)
Sodium: 139 mmol/L (ref 135–146)
Total Bilirubin: 0.4 mg/dL (ref 0.2–1.2)
Total Protein: 6.9 g/dL (ref 6.1–8.1)

## 2016-10-11 LAB — CBC WITH DIFFERENTIAL/PLATELET
Basophils Absolute: 42 cells/uL (ref 0–200)
Basophils Relative: 0.7 %
Eosinophils Absolute: 576 cells/uL — ABNORMAL HIGH (ref 15–500)
Eosinophils Relative: 9.6 %
HCT: 34.6 % — ABNORMAL LOW (ref 35.0–45.0)
Hemoglobin: 11.6 g/dL — ABNORMAL LOW (ref 11.7–15.5)
Lymphs Abs: 2100 cells/uL (ref 850–3900)
MCH: 31.9 pg (ref 27.0–33.0)
MCHC: 33.5 g/dL (ref 32.0–36.0)
MCV: 95.1 fL (ref 80.0–100.0)
MPV: 10.4 fL (ref 7.5–12.5)
Monocytes Relative: 5.1 %
Neutro Abs: 2976 cells/uL (ref 1500–7800)
Neutrophils Relative %: 49.6 %
Platelets: 200 10*3/uL (ref 140–400)
RBC: 3.64 10*6/uL — ABNORMAL LOW (ref 3.80–5.10)
RDW: 13.1 % (ref 11.0–15.0)
Total Lymphocyte: 35 %
WBC mixed population: 306 cells/uL (ref 200–950)
WBC: 6 10*3/uL (ref 3.8–10.8)

## 2016-10-11 NOTE — Progress Notes (Signed)
Glucose is elevated. These notified patient and fax results to her PCP.

## 2016-10-24 NOTE — Progress Notes (Deleted)
Office Visit Note  Patient: Connie Ross             Date of Birth: [birth year]           MRN: [id number]             PCP: Mirna Mires, MD Referring: Mirna Mires, MD Visit Date: 11/04/2016 Occupation: @GUAROCC @    Subjective:  No chief complaint on file.   History of Present Illness: Connie Ross is a 61 y.o. female ***   Activities of Daily Living:  Patient reports morning stiffness for *** {minute/hour:19697}.   Patient {ACTIONS;DENIES/REPORTS:21021675::"Denies"} nocturnal pain.  Difficulty dressing/grooming: {ACTIONS;DENIES/REPORTS:21021675::"Denies"} Difficulty climbing stairs: {ACTIONS;DENIES/REPORTS:21021675::"Denies"} Difficulty getting out of chair: {ACTIONS;DENIES/REPORTS:21021675::"Denies"} Difficulty using hands for taps, buttons, cutlery, and/or writing: {ACTIONS;DENIES/REPORTS:21021675::"Denies"}   No Rheumatology ROS completed.   PMFS History:  Patient Active Problem List   Diagnosis Date Noted  . Pain in right foot 04/06/2016  . Rheumatoid arthritis of multiple sites with negative rheumatoid factor (HCC) 02/03/2016  . High risk medication use 02/03/2016  . 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 Essential hypertension 02/03/2016  . History of diabetes mellitus 02/03/2016  . History of diabetic neuropathy 02/03/2016  . Dyslipidemia 02/03/2016  . Vitamin D deficiency 02/03/2016  . Abnormality of gait 03/15/2013  . Right knee pain 03/15/2013  . History of total knee replacement, bilateral 02/27/2013    Past Medical History:  Diagnosis Date  . Anxiety    takes no meds  . DVT (deep venous thrombosis) (HCC)   . GERD (gastroesophageal reflux disease)   . High cholesterol   . Hypertension   . Rheumatoid arthritis (HCC)   . Type II diabetes mellitus (HCC)     No family history on file. Past Surgical History:  Procedure Laterality Date  . EYE SURGERY     bilateral cataracts  . TOTAL KNEE ARTHROPLASTY Right 02/28/2013  .  TOTAL KNEE ARTHROPLASTY Right 02/27/2013   Procedure: TOTAL KNEE ARTHROPLASTY- knee;  Surgeon: 03/01/2013, MD;  Location: [facility];  Service: Orthopedics;  Laterality: Right;  Right Total Knee Arthroplasty  . TOTAL KNEE ARTHROPLASTY Left 07/04/2014   Procedure: LEFT TOTAL KNEE ARTHROPLASTY;  Surgeon: 09/04/2014, MD;  Location: [facility] OR;  Service: Orthopedics;  Laterality: Left;   Social History   Social History Narrative  . No narrative on file     Objective: Vital Signs: LMP  (LMP Unknown)    Physical Exam   Musculoskeletal Exam: ***  CDAI Exam: No CDAI exam completed.    Investigation: No additional findings.TB Gold: Negative in 11/2015   CBC Latest Ref Rng & Units 10/10/2016 06/21/2016 06/20/2016  WBC 3.8 - 10.8 Thousand/uL 6.0 8.1 6.7  Hemoglobin 11.7 - 15.5 g/dL 11.6(L) 11.4(L) 11.8  Hematocrit 35.0 - 45.0 % 34.6(L) 35.4(L) 37.7  Platelets 140 - 400 Thousand/uL 200 253 272   CMP Latest Ref Rng & Units 10/10/2016 06/21/2016 06/20/2016  Glucose 65 - 99 mg/dL 06/22/2016) 163(W) 466(Z)  BUN 7 - 25 mg/dL 21 18 22   Creatinine 0.50 - 0.99 mg/dL 993(TT 0.17  Sodium 135 - 146 mmol/L 139 137 137  Potassium 3.5 - 5.3 mmol/L 4.1 4.1 4.6  Chloride 98 - 110 mmol/L 101 104 102  CO2 20 - 32 mmol/L 31 26 27   Calcium 8.6 - 10.4 mg/dL 9.6 9.4 9.1  Total Protein 6.1 - 8.1 g/dL 6.9 - 7.2  Total Bilirubin 0.2 - 1.2 mg/dL 0.4 - 0.4  Alkaline Phos 33 - 130 U/L - -  102  AST 10 - 35 U/L 23 - 23  ALT 6 - 29 U/L 24 - 21    Imaging: No results found.  Speciality Comments: No specialty comments available.    Procedures:  No procedures performed Allergies: Patient has no known allergies.   Assessment / Plan:     Visit Diagnoses: No diagnosis found.    Orders: No orders of the defined types were placed in this encounter.  No orders of the defined types were placed in this encounter.   Face-to-face time spent with patient was *** minutes. 50% of time was spent in counseling and  coordination of care.  Follow-Up Instructions: No Follow-up on file.   Ellen Henri, NT  Note - This record has been created using Animal nutritionist.  Chart creation errors have been sought, but may not always  have been located. Such creation errors do not reflect on  the standard of medical care.

## 2016-11-04 ENCOUNTER — Ambulatory Visit: Payer: BLUE CROSS/BLUE SHIELD | Admitting: Rheumatology

## 2016-12-02 DIAGNOSIS — M5410 Radiculopathy, site unspecified: Secondary | ICD-10-CM | POA: Diagnosis not present

## 2016-12-02 DIAGNOSIS — M76821 Posterior tibial tendinitis, right leg: Secondary | ICD-10-CM | POA: Diagnosis not present

## 2016-12-02 DIAGNOSIS — G8929 Other chronic pain: Secondary | ICD-10-CM | POA: Diagnosis not present

## 2016-12-02 DIAGNOSIS — M79671 Pain in right foot: Secondary | ICD-10-CM | POA: Diagnosis not present

## 2016-12-05 ENCOUNTER — Other Ambulatory Visit: Payer: Self-pay | Admitting: Rheumatology

## 2016-12-06 NOTE — Telephone Encounter (Signed)
Sch appt, Ok for 30 d rx

## 2016-12-06 NOTE — Telephone Encounter (Signed)
Attempted to contact the patient and left message for patient to call the office.  

## 2016-12-06 NOTE — Telephone Encounter (Signed)
Last Visit: 07/01/16 Next Visit: No showed appointment  On 11/04/16. No follow up scheduled.  Labs: 10/10/16 Elevated glucose  TB Gold: 11/23/15 Neg  Okay to refill Humira?

## 2016-12-19 NOTE — Progress Notes (Signed)
Office Visit Note  Patient: Connie Ross             Date of Birth: January 10, 1955           MRN: [id number]             PCP: Iona Beard, MD Referring: Iona Beard, MD Visit Date: 12/29/2016 Occupation: '@GUAROCC'$ @    Subjective:  Right foot pain.   History of Present Illness: Connie Ross is a 61 y.o. female with history of rheumatoid arthritis osteoarthritis and disc disease. She had been doing well on combination of Virgina Jock and Humira. She was accompanied by her daughter today who states that she ran out of her medications about 2 weeks ago. She's been having a flare with increased pain and discomfort in her right foot. Bilateral total knee replacement is doing well.  Activities of Daily Living:  Patient reports morning stiffness for 10 minute.   Patient Reports nocturnal pain.  Difficulty dressing/grooming: Denies Difficulty climbing stairs: Denies Difficulty getting out of chair: Reports Difficulty using hands for taps, buttons, cutlery, and/or writing: Denies   Review of Systems  Constitutional: Negative.  Negative for fatigue, night sweats, weight gain, weight loss and weakness.  HENT: Positive for mouth dryness. Negative for mouth sores, trouble swallowing, trouble swallowing and nose dryness.   Eyes: Negative.  Negative for pain, redness, visual disturbance and dryness.  Respiratory: Negative.  Negative for cough, shortness of breath and difficulty breathing.   Cardiovascular: Positive for hypertension. Negative for chest pain, palpitations, irregular heartbeat and swelling in legs/feet.  Gastrointestinal: Negative.  Negative for blood in stool, constipation and diarrhea.  Endocrine: Negative for increased urination.  Genitourinary: Negative for vaginal dryness.  Musculoskeletal: Positive for arthralgias, joint pain, joint swelling and morning stiffness. Negative for myalgias, muscle weakness, muscle tenderness and myalgias.  Skin: Negative.  Negative for  color change, rash, hair loss, skin tightness, ulcers and sensitivity to sunlight.  Allergic/Immunologic: Negative for susceptible to infections.  Neurological: Negative.  Negative for dizziness, numbness, headaches, memory loss and night sweats.  Hematological: Negative for swollen glands.  Psychiatric/Behavioral: Positive for sleep disturbance. Negative for depressed mood. The patient is not nervous/anxious.     PMFS History:  Patient Active Problem List   Diagnosis Date Noted  . Pain in right foot 04/06/2016  . Rheumatoid arthritis of multiple sites with negative rheumatoid factor ([place]) 02/03/2016  . High risk medication use 02/03/2016  . 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 Essential hypertension 02/03/2016  . History of diabetes mellitus 02/03/2016  . History of diabetic neuropathy 02/03/2016  . Dyslipidemia 02/03/2016  . Vitamin D deficiency 02/03/2016  . Abnormality of gait 03/15/2013  . Right knee pain 03/15/2013  . History of total knee replacement, bilateral 02/27/2013    Past Medical History:  Diagnosis Date  . Anxiety    takes no meds  . DVT (deep venous thrombosis) ([place])   . GERD (gastroesophageal reflux disease)   . High cholesterol   . Hypertension   . Rheumatoid arthritis ([place])   . Type II diabetes mellitus ([place])     History reviewed. No pertinent family history. Past Surgical History:  Procedure Laterality Date  . EYE SURGERY     bilateral cataracts  . TOTAL KNEE ARTHROPLASTY Right 02/28/2013  . TOTAL KNEE ARTHROPLASTY Right 02/27/2013   Procedure: TOTAL KNEE ARTHROPLASTY- knee;  Surgeon: Newt Minion, MD;  Location: [facility];  Service: Orthopedics;  Laterality: Right;  Right Total Knee Arthroplasty  . TOTAL KNEE  ARTHROPLASTY Left 07/04/2014   Procedure: LEFT TOTAL KNEE ARTHROPLASTY;  Surgeon: Newt Minion, MD;  Location: [facility];  Service: Orthopedics;  Laterality: Left;   Social History   Social History Narrative  . Not on file      Objective: Vital Signs: BP (!) 96/55 (BP Location: Left Arm, Patient Position: Sitting, Cuff Size: Normal)   Pulse 68   Ht _0  (1.6 m)   Wt 214 lb (97.1 kg)   LMP  (LMP Unknown)   BMI 37.91 kg/m    Physical Exam  Constitutional: She is oriented to person, place, and time. She appears well-developed and well-nourished.  HENT:  Head: Normocephalic and atraumatic.  Eyes: Conjunctivae and EOM are normal.  Neck: Normal range of motion.  Cardiovascular: Normal rate, regular rhythm, normal heart sounds and intact distal pulses.  Pulmonary/Chest: Effort normal and breath sounds normal.  Abdominal: Soft. Bowel sounds are normal.  Lymphadenopathy:    She has no cervical adenopathy.  Neurological: She is alert and oriented to person, place, and time.  Skin: Skin is warm and dry. Capillary refill takes less than 2 seconds.  Psychiatric: She has a normal mood and affect. Her behavior is normal.  Nursing note and vitals reviewed.    Musculoskeletal Exam: C-spine and thoracic lumbar spine limited range of motion with discomfort. Shoulder joints elbow joints wrist joints are good range of motion. She had no synovitis over her MCP joints. Minimal DIP PIP thickening and hyperextension of PIP joints was  noted. Hip joints are good range of motion. Her bilateral total knee replacements are doing well. She is some tenderness over her MTP joints but no synovitis was noted.  CDAI Exam: CDAI Homunculus Exam:   Tenderness:  Right foot: 4th MTP and 5th MTP  Joint Counts:  CDAI Tender Joint count: 0 CDAI Swollen Joint count: 0  Global Assessments:  Patient Global Assessment: 6 Provider Global Assessment: 5  CDAI Calculated Score: 11    Investigation: No additional findings.Tb Gold: 11/23/2015 CBC Latest Ref Rng & Units 10/10/2016 06/21/2016 06/20/2016  WBC 3.8 - 10.8 Thousand/uL 6.0 8.1 6.7  Hemoglobin 11.7 - 15.5 g/dL 11.6(L) 11.4(L) 11.8  Hematocrit 35.0 - 45.0 % 34.6(L) 35.4(L) 37.7   Platelets 140 - 400 Thousand/uL 200 253 272   CMP Latest Ref Rng & Units 10/10/2016 06/21/2016 06/20/2016  Glucose 65 - 99 mg/dL 337(H) 362(H) 502(HH)  BUN 7 - 25 mg/dL _1 Creatinine 0.50 - 0.99 mg/dL 0.77 0.77 0.89  Sodium 135 - 146 mmol/L 139 137 137  Potassium 3.5 - 5.3 mmol/L 4.1 4.1 4.6  Chloride 98 - 110 mmol/L 101 104 102  CO2 20 - 32 mmol/L _2 Calcium 8.6 - 10.4 mg/dL 9.6 9.4 9.1  Total Protein 6.1 - 8.1 g/dL 6.9 - 7.2  Total Bilirubin 0.2 - 1.2 mg/dL 0.4 - 0.4  Alkaline Phos 33 - 130 U/L - - 102  AST 10 - 35 U/L 23 - 23  ALT 6 - 29 U/L 24 - 21    Imaging: No results found.  Speciality Comments: No specialty comments available.    Procedures:  No procedures performed Allergies: Patient has no known allergies.   Assessment / Plan:     Visit Diagnoses: Rheumatoid arthritis of multiple sites with negative rheumatoid factor (HCC) - RF negative, CCP negative, elevated ESR, positive ANA, positive Ro. She's having a flare off Rasuvo and Humira as she did not get it filled in time. She's  been having pain and discomfort in her right foot. Her labs were drawn today and we will give her sample injections for both medications.  High risk medication use - Rasuvo 20 mg subcutaneous every week, folic acid 2 mg by mouth daily, Humira 40 mg subcutaneous every other week - Plan: CBC with Differential/Platelet, COMPLETE METABOLIC PANEL WITH GFR, QuantiFERON-TB Gold Plus. Need for having follow-up visits and lab work in time was discussed.  History of total knee replacement, bilateral: She has some chronic discomfort  DDD (degenerative disc disease), lumbar-she has chronic lower back pain  History of vitamin D deficiency-she has been taking vitamin D  Other medical problems are listed as follows:  History of diabetes mellitus  History of diabetic neuropathy  History of hypertension  Dyslipidemia    Orders: Orders Placed This Encounter  Procedures  . CBC with  Differential/Platelet  . COMPLETE METABOLIC PANEL WITH GFR  . QuantiFERON-TB Gold Plus   Meds ordered this encounter  Medications  . Methotrexate, PF, (RASUVO) 20 MG/0.4ML SOAJ    Sig: INJECT ONE PEN (20 MG) SUBCUTANEOUSLY ONCE EVERY WEEK. STORE AT ROOM TEMPERATURE BETWEEN 68 - 85 DEGREES F.    Dispense:  12 pen    Refill:  0  . Adalimumab (HUMIRA PEN) 40 MG/0.8ML PNKT    Sig: Inject 40 mg into the skin every 14 (fourteen) days.    Dispense:  3 each    Refill:  0    Face-to-face time spent with patient was 30 minutes. Greater than 50% of time was spent in counseling and coordination of care.  Follow-Up Instructions: Return in about 5 months (around 05/29/2017) for Rheumatoid arthritis,DDD.   Bo Merino, MD  Note - This record has been created using Editor, commissioning.  Chart creation errors have been sought, but may not always  have been located. Such creation errors do not reflect on  the standard of medical care.

## 2016-12-29 ENCOUNTER — Telehealth: Payer: Self-pay

## 2016-12-29 ENCOUNTER — Ambulatory Visit (INDEPENDENT_AMBULATORY_CARE_PROVIDER_SITE_OTHER): Payer: BLUE CROSS/BLUE SHIELD | Admitting: Rheumatology

## 2016-12-29 ENCOUNTER — Encounter: Payer: Self-pay | Admitting: Rheumatology

## 2016-12-29 VITALS — BP 96/55 | HR 68 | Ht 63.0 in | Wt 214.0 lb

## 2016-12-29 DIAGNOSIS — Z79899 Other long term (current) drug therapy: Secondary | ICD-10-CM | POA: Diagnosis not present

## 2016-12-29 DIAGNOSIS — M5136 Other intervertebral disc degeneration, lumbar region: Secondary | ICD-10-CM | POA: Diagnosis not present

## 2016-12-29 DIAGNOSIS — Z96653 Presence of artificial knee joint, bilateral: Secondary | ICD-10-CM | POA: Diagnosis not present

## 2016-12-29 DIAGNOSIS — M0609 Rheumatoid arthritis without rheumatoid factor, multiple sites: Secondary | ICD-10-CM | POA: Diagnosis not present

## 2016-12-29 DIAGNOSIS — E785 Hyperlipidemia, unspecified: Secondary | ICD-10-CM

## 2016-12-29 DIAGNOSIS — Z8639 Personal history of other endocrine, nutritional and metabolic disease: Secondary | ICD-10-CM | POA: Diagnosis not present

## 2016-12-29 DIAGNOSIS — Z8679 Personal history of other diseases of the circulatory system: Secondary | ICD-10-CM | POA: Diagnosis not present

## 2016-12-29 MED ORDER — METHOTREXATE (PF) 20 MG/0.4ML ~~LOC~~ SOAJ: SUBCUTANEOUS | 0 refills | Status: DC

## 2016-12-29 MED ORDER — ADALIMUMAB 40 MG/0.8ML ~~LOC~~ AJKT: 40.0000 mg | AUTO-INJECTOR | SUBCUTANEOUS | 0 refills | Status: DC

## 2016-12-29 NOTE — Telephone Encounter (Signed)
Provided patient with samples of Humira and Rasuvo.   Medication Samples have been provided to the patient.  Drug name: Humira      Strength: 40mg         Qty:1 pen  LOT:  Exp.Date: 04/2018  Dosing instructions: Inject 1 pen (40mg ) every other week.  The patient has been instructed regarding the correct time, dose, and frequency of taking this medication, including desired effects and most common side effects.   Connie Ross 2:35 PM 12/29/2016   Medication Samples have been provided to the patient.  Drug name: Rasuvo       Strength: 20mg         Qty: 1 pen  LOT: AA  Exp.Date: 06/2017  Dosing instructions: Inject 1 pen (20mg ) once every week.   The patient has been instructed regarding the correct time, dose, and frequency of taking this medication, including desired effects and most common side effects.   2:36 PM 12/29/2016

## 2016-12-29 NOTE — Patient Instructions (Signed)
Standing Labs We placed an order today for your standing lab work.    Please come back and get your standing labs in April and every 3 months  We have open lab Monday through Friday from 8:30-11:30 AM and 1:30-4 PM at the office of Dr. Cola Gane.   The office is located at [address], [address], [place], [place] [address] No appointment is necessary.   Labs are drawn by [facility].  You may receive a bill from [facility] for your lab work. If you have any questions regarding directions or hours of operation,  please call [phone].    

## 2016-12-30 NOTE — Progress Notes (Signed)
Labs are stable. Her glucose is still elevated.

## 2016-12-31 LAB — CBC WITH DIFFERENTIAL/PLATELET
Basophils Absolute: 30 cells/uL (ref 0–200)
Basophils Relative: 0.4 %
Eosinophils Absolute: 448 cells/uL (ref 15–500)
Eosinophils Relative: 5.9 %
HCT: 34.7 % — ABNORMAL LOW (ref 35.0–45.0)
Hemoglobin: 11.6 g/dL — ABNORMAL LOW (ref 11.7–15.5)
Lymphs Abs: 1657 cells/uL (ref 850–3900)
MCH: 30.4 pg (ref 27.0–33.0)
MCHC: 33.4 g/dL (ref 32.0–36.0)
MCV: 91.1 fL (ref 80.0–100.0)
MPV: 10.1 fL (ref 7.5–12.5)
Monocytes Relative: 6.7 %
Neutro Abs: 4955 cells/uL (ref 1500–7800)
Neutrophils Relative %: 65.2 %
Platelets: 252 10*3/uL (ref 140–400)
RBC: 3.81 10*6/uL (ref 3.80–5.10)
RDW: 12.8 % (ref 11.0–15.0)
Total Lymphocyte: 21.8 %
WBC mixed population: 509 cells/uL (ref 200–950)
WBC: 7.6 10*3/uL (ref 3.8–10.8)

## 2016-12-31 LAB — QUANTIFERON-TB GOLD PLUS
Mitogen-NIL: >10 IU/mL
NIL: 0.07 IU/mL
QuantiFERON-TB Gold Plus: NEGATIVE
TB1-NIL: 0.01 IU/mL
TB2-NIL: 0 IU/mL

## 2016-12-31 LAB — COMPLETE METABOLIC PANEL WITH GFR
AG Ratio: 1.2 (calc) (ref 1.0–2.5)
ALT: 20 U/L (ref 6–29)
AST: 29 U/L (ref 10–35)
Albumin: 3.7 g/dL (ref 3.6–5.1)
Alkaline phosphatase (APISO): 91 U/L (ref 33–130)
BUN: 19 mg/dL (ref 7–25)
CO2: 31 mmol/L (ref 20–32)
Calcium: 9.5 mg/dL (ref 8.6–10.4)
Chloride: 102 mmol/L (ref 98–110)
Creat: 0.81 mg/dL (ref 0.50–0.99)
GFR, Est African American: 91 mL/min/{1.73_m2} (ref >=60)
GFR, Est Non African American: 78 mL/min/{1.73_m2} (ref >=60)
Globulin: 3 g/dL (calc) (ref 1.9–3.7)
Glucose, Bld: 239 mg/dL — ABNORMAL HIGH (ref 65–99)
Potassium: 4.3 mmol/L (ref 3.5–5.3)
Sodium: 139 mmol/L (ref 135–146)
Total Bilirubin: 0.5 mg/dL (ref 0.2–1.2)
Total Protein: 6.7 g/dL (ref 6.1–8.1)

## 2017-03-30 ENCOUNTER — Other Ambulatory Visit: Payer: Self-pay | Admitting: Rheumatology

## 2017-03-30 NOTE — Telephone Encounter (Signed)
Last Visit: 12/29/16 Next Visit due May 2019. Message sent to front to schedule patient Labs: 12/29/16 Labs are stable. Her glucose is still elevated. TB Gold: 12/29/16 Neg   Left message to advise patient is due to update labs.   Okay to refill 30 day supply per Dr. Corliss Skains

## 2017-03-31 ENCOUNTER — Telehealth: Payer: Self-pay

## 2017-03-31 NOTE — Telephone Encounter (Signed)
Received a fax from [facility] stating that they have received the RXs for rasuvo and humira.   Will send document to scan center.  Daoud Lobue, [place], CPhT 2:53 PM

## 2017-04-06 NOTE — Progress Notes (Signed)
Office Visit Note  Patient: Connie Ross             Date of Birth: Jan 09, 1955           MRN: [id number]             PCP: Iona Beard, MD Referring: Iona Beard, MD Visit Date: 04/20/2017 Occupation: _0 @    Subjective:  Medication Management   History of Present Illness: Connie Ross is a 62 y.o. female with history of seronegative rheumatoid arthritis.  She states she has been having increased pain and discomfort in the right foot.  She ran out of Rasuvo and Humira about 3 weeks ago.  Her bilateral total knee replacement is doing well.  She still continues to have some lower back pain.  She mobilizes with the help of a cane.  Activities of Daily Living:  Patient reports morning stiffness for 2 hours.   Patient Reports nocturnal pain.  Difficulty dressing/grooming: Denies Difficulty climbing stairs: Reports Difficulty getting out of chair: Reports Difficulty using hands for taps, buttons, cutlery, and/or writing: Denies   Review of Systems  Constitutional: Positive for fatigue. Negative for night sweats, weight gain and weight loss.  HENT: Negative for mouth sores, trouble swallowing, trouble swallowing, mouth dryness and nose dryness.   Eyes: Negative for pain, redness, visual disturbance and dryness.  Respiratory: Negative for cough, shortness of breath and difficulty breathing.   Cardiovascular: Negative for chest pain, palpitations, hypertension, irregular heartbeat and swelling in legs/feet.  Gastrointestinal: Negative for blood in stool, constipation and diarrhea.  Endocrine: Negative for increased urination.  Genitourinary: Negative for vaginal dryness.  Musculoskeletal: Positive for arthralgias, joint pain, myalgias, morning stiffness and myalgias. Negative for joint swelling, muscle weakness and muscle tenderness.  Skin: Negative for color change, rash, hair loss, skin tightness, ulcers and sensitivity to sunlight.  Allergic/Immunologic:  Negative for susceptible to infections.  Neurological: Negative for dizziness, memory loss, night sweats and weakness.  Hematological: Negative for swollen glands.  Psychiatric/Behavioral: Positive for sleep disturbance. Negative for depressed mood. The patient is not nervous/anxious.     PMFS History:  Patient Active Problem List   Diagnosis Date Noted  . Pain in right foot 04/06/2016  . Rheumatoid arthritis of multiple sites with negative rheumatoid factor ([place]) 02/03/2016  . High risk medication use 02/03/2016  . 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 Essential hypertension 02/03/2016  . History of diabetes mellitus 02/03/2016  . History of diabetic neuropathy 02/03/2016  . Dyslipidemia 02/03/2016  . Vitamin D deficiency 02/03/2016  . Abnormality of gait 03/15/2013  . Right knee pain 03/15/2013  . History of total knee replacement, bilateral 02/27/2013    Past Medical History:  Diagnosis Date  . Anxiety    takes no meds  . DVT (deep venous thrombosis) ([place])   . GERD (gastroesophageal reflux disease)   . High cholesterol   . Hypertension   . Rheumatoid arthritis ([place])   . Type II diabetes mellitus ([place])     Family History  Adopted: Yes  Problem Relation Age of Onset  . Cancer Mother        breast  . Healthy Son   . Healthy Daughter   . Healthy Daughter    Past Surgical History:  Procedure Laterality Date  . EYE SURGERY     bilateral cataracts  . TOTAL KNEE ARTHROPLASTY Right 02/28/2013  . TOTAL KNEE ARTHROPLASTY Right 02/27/2013   Procedure: TOTAL KNEE ARTHROPLASTY- knee;  Surgeon: Newt Minion, MD;  Location:  [facility];  Service: Orthopedics;  Laterality: Right;  Right Total Knee Arthroplasty  . TOTAL KNEE ARTHROPLASTY Left 07/04/2014   Procedure: LEFT TOTAL KNEE ARTHROPLASTY;  Surgeon: Newt Minion, MD;  Location: [facility];  Service: Orthopedics;  Laterality: Left;   Social History   Social History Narrative  . Not on file     Objective: Vital Signs: BP  120/71 (BP Location: Left Arm, Patient Position: Sitting, Cuff Size: Normal)   Pulse 69   Resp 16   Ht 5' 2" (1.575 m)   Wt 215 lb (97.5 kg)   LMP  (LMP Unknown)   BMI 39.32 kg/m    Physical Exam  Constitutional: She is oriented to person, place, and time. She appears well-developed and well-nourished.  HENT:  Head: Normocephalic and atraumatic.  Eyes: Conjunctivae and EOM are normal.  Neck: Normal range of motion.  Cardiovascular: Normal rate, regular rhythm, normal heart sounds and intact distal pulses.  Pulmonary/Chest: Effort normal and breath sounds normal.  Abdominal: Soft. Bowel sounds are normal.  Lymphadenopathy:    She has no cervical adenopathy.  Neurological: She is alert and oriented to person, place, and time.  Skin: Skin is warm and dry. Capillary refill takes less than 2 seconds.  Psychiatric: She has a normal mood and affect. Her behavior is normal.  Nursing note and vitals reviewed.    Musculoskeletal Exam: C-spine thoracic lumbar spine limited range of motion.  Shoulder joints elbows joints wrist joints MCPs with good range of motion.  She is synovial thickening over MCP joint with no synovitis.  She has hyperextension of her PIP joints.  Hip joints were in good range of motion.  She has bilateral total knee replacement which is doing well.  She has subluxation of tibiotalar joint in her right foot which causes discomfort.  No synovitis was noted.  CDAI Exam: CDAI Homunculus Exam:   Joint Counts:  CDAI Tender Joint count: 0 CDAI Swollen Joint count: 0  Global Assessments:  Patient Global Assessment: 3 Provider Global Assessment: 3  CDAI Calculated Score: 6    Investigation: No additional findings.TB Gold: 12/29/2016 Negative  CBC Latest Ref Rng & Units 12/29/2016 10/10/2016 06/21/2016  WBC 3.8 - 10.8 Thousand/uL 7.6 6.0 8.1  Hemoglobin 11.7 - 15.5 g/dL 11.6(L) 11.6(L) 11.4(L)  Hematocrit 35.0 - 45.0 % 34.7(L) 34.6(L) 35.4(L)  Platelets 140 - 400  Thousand/uL 252 200 253   CMP Latest Ref Rng & Units 12/29/2016 10/10/2016 06/21/2016  Glucose 65 - 99 mg/dL 239(H) 337(H) 362(H)  BUN 7 - 25 mg/dL _0 Creatinine 0.50 - 0.99 mg/dL 0.81 0.77 0.77  Sodium 135 - 146 mmol/L 139 139 137  Potassium 3.5 - 5.3 mmol/L 4.3 4.1 4.1  Chloride 98 - 110 mmol/L 102 101 104  CO2 20 - 32 mmol/L _1 Calcium 8.6 - 10.4 mg/dL 9.5 9.6 9.4  Total Protein 6.1 - 8.1 g/dL 6.7 6.9 -  Total Bilirubin 0.2 - 1.2 mg/dL 0.5 0.4 -  Alkaline Phos 33 - 130 U/L - - -  AST 10 - 35 U/L 29 23 -  ALT 6 - 29 U/L 20 24 -    Imaging: No results found.  Speciality Comments: No specialty comments available.    Procedures:  No procedures performed Allergies: Patient has no known allergies.   Assessment / Plan:     Visit Diagnoses: Rheumatoid arthritis of multiple sites with negative rheumatoid factor (HCC) - RF negative, CCP negative, elevated ESR, positive  ANA, positive Ro.  She continues to have some discomfort from rheumatoid arthritis but no active synovitis was noted.  She has been out of medication for 3 weeks due to insurance issues.  She will resume her medications.  She has no synovitis on examination today.  High risk medication use - Rasuvo 20 mg subcutaneous every week, folic acid 2 mg by mouth daily, Humira 40 mg subcutaneous every other week - Plan: CBC with Differential/Platelet, COMPLETE METABOLIC PANEL WITH GFR today and then every 3 months.  History of total knee replacement, bilateral: Doing well  Right foot pain: She continues to have pain and discomfort in her right ankle due to subluxation of the tibiotalar joint.  There is no active synovitis.  DDD (degenerative disc disease), lumbar-she continues to have some lower back pain.  History of vitamin D deficiency-she is on vitamin D.  Dyslipidemia  History of diabetes mellitus  History of hypertension-her blood pressure is controlled.  History of diabetic neuropathy   Class II  severe obesity-weight loss diet and exercise was discussed.  Association of heart disease with rheumatoid arthritis was discussed. Need to monitor blood pressure, cholesterol, and to exercise 30-60 minutes on daily basis was discussed. Poor dental hygiene can be a predisposing factor for rheumatoid arthritis. Good dental hygiene was discussed.   Orders: Orders Placed This Encounter  Procedures  . CBC with Differential/Platelet  . COMPLETE METABOLIC PANEL WITH GFR   No orders of the defined types were placed in this encounter.   Face-to-face time spent with patient was 30 minutes.  Greater than 50% of time was spent in counseling and coordination of care.  Follow-Up Instructions: Return in about 5 months (around 09/20/2017) for Rheumatoid arthritis, DJD.   Bo Merino, MD  Note - This record has been created using Editor, commissioning.  Chart creation errors have been sought, but may not always  have been located. Such creation errors do not reflect on  the standard of medical care.

## 2017-04-06 NOTE — Telephone Encounter (Signed)
Received a call from [facility] stating that they received the denial for refill on Rasuvo and Humira but did not receive the following Rxs. Sue Lush, LPN will re-send the Rxs to the pharmacy for processing today.   Adra Shepler, [place], CPhT 10:58 AM

## 2017-04-20 ENCOUNTER — Other Ambulatory Visit: Payer: Self-pay

## 2017-04-20 ENCOUNTER — Ambulatory Visit (INDEPENDENT_AMBULATORY_CARE_PROVIDER_SITE_OTHER): Payer: BLUE CROSS/BLUE SHIELD | Admitting: Rheumatology

## 2017-04-20 ENCOUNTER — Encounter: Payer: Self-pay | Admitting: Rheumatology

## 2017-04-20 VITALS — BP 120/71 | HR 69 | Resp 16 | Ht 62.0 in | Wt 215.0 lb

## 2017-04-20 DIAGNOSIS — Z79899 Other long term (current) drug therapy: Secondary | ICD-10-CM

## 2017-04-20 DIAGNOSIS — Z8679 Personal history of other diseases of the circulatory system: Secondary | ICD-10-CM

## 2017-04-20 DIAGNOSIS — Z8639 Personal history of other endocrine, nutritional and metabolic disease: Secondary | ICD-10-CM

## 2017-04-20 DIAGNOSIS — Z96653 Presence of artificial knee joint, bilateral: Secondary | ICD-10-CM

## 2017-04-20 DIAGNOSIS — E785 Hyperlipidemia, unspecified: Secondary | ICD-10-CM

## 2017-04-20 DIAGNOSIS — Z6839 Body mass index (BMI) 39.0-39.9, adult: Secondary | ICD-10-CM | POA: Diagnosis not present

## 2017-04-20 DIAGNOSIS — M79671 Pain in right foot: Secondary | ICD-10-CM | POA: Diagnosis not present

## 2017-04-20 DIAGNOSIS — M0609 Rheumatoid arthritis without rheumatoid factor, multiple sites: Secondary | ICD-10-CM | POA: Diagnosis not present

## 2017-04-20 DIAGNOSIS — M5136 Other intervertebral disc degeneration, lumbar region: Secondary | ICD-10-CM | POA: Diagnosis not present

## 2017-04-20 DIAGNOSIS — M51369 Other intervertebral disc degeneration, lumbar region without mention of lumbar back pain or lower extremity pain: Secondary | ICD-10-CM

## 2017-04-20 NOTE — Patient Instructions (Signed)
Standing Labs We placed an order today for your standing lab work.    Please come back and get your standing labs in July and every 3 months   We have open lab Monday through Friday from 8:30-11:30 AM and 1:30-4:00 PM  at the office of Dr. Breane Grunwald.   You may experience shorter wait times on Monday and Friday afternoons. The office is located at [address], [address], [place], [place] [address] No appointment is necessary.   Labs are drawn by [facility].  You may receive a bill from [facility] for your lab work. If you have any questions regarding directions or hours of operation,  please call [phone].    

## 2017-04-21 LAB — COMPLETE METABOLIC PANEL WITH GFR
AG Ratio: 1.2 (calc) (ref 1.0–2.5)
ALT: 17 U/L (ref 6–29)
AST: 22 U/L (ref 10–35)
Albumin: 3.9 g/dL (ref 3.6–5.1)
Alkaline phosphatase (APISO): 101 U/L (ref 33–130)
BUN: 15 mg/dL (ref 7–25)
CO2: 34 mmol/L — ABNORMAL HIGH (ref 20–32)
Calcium: 9.6 mg/dL (ref 8.6–10.4)
Chloride: 99 mmol/L (ref 98–110)
Creat: 0.76 mg/dL (ref 0.50–0.99)
GFR, Est African American: 97 mL/min/{1.73_m2} (ref >=60)
GFR, Est Non African American: 84 mL/min/{1.73_m2} (ref >=60)
Globulin: 3.3 g/dL (calc) (ref 1.9–3.7)
Glucose, Bld: 343 mg/dL — ABNORMAL HIGH (ref 65–99)
Potassium: 4.9 mmol/L (ref 3.5–5.3)
Sodium: 136 mmol/L (ref 135–146)
Total Bilirubin: 0.4 mg/dL (ref 0.2–1.2)
Total Protein: 7.2 g/dL (ref 6.1–8.1)

## 2017-04-21 LAB — CBC WITH DIFFERENTIAL/PLATELET
Basophils Absolute: 39 cells/uL (ref 0–200)
Basophils Relative: 0.7 %
Eosinophils Absolute: 230 cells/uL (ref 15–500)
Eosinophils Relative: 4.1 %
HCT: 36.1 % (ref 35.0–45.0)
Hemoglobin: 12.2 g/dL (ref 11.7–15.5)
Lymphs Abs: 1109 cells/uL (ref 850–3900)
MCH: 31.1 pg (ref 27.0–33.0)
MCHC: 33.8 g/dL (ref 32.0–36.0)
MCV: 92.1 fL (ref 80.0–100.0)
MPV: 10.6 fL (ref 7.5–12.5)
Monocytes Relative: 9.8 %
Neutro Abs: 3674 cells/uL (ref 1500–7800)
Neutrophils Relative %: 65.6 %
Platelets: 163 10*3/uL (ref 140–400)
RBC: 3.92 10*6/uL (ref 3.80–5.10)
RDW: 13.2 % (ref 11.0–15.0)
Total Lymphocyte: 19.8 %
WBC mixed population: 549 cells/uL (ref 200–950)
WBC: 5.6 10*3/uL (ref 3.8–10.8)

## 2017-04-25 ENCOUNTER — Other Ambulatory Visit: Payer: Self-pay | Admitting: *Deleted

## 2017-04-25 MED ORDER — METHOTREXATE (PF) 20 MG/0.4ML ~~LOC~~ SOAJ: SUBCUTANEOUS | 0 refills | Status: DC

## 2017-04-25 MED ORDER — ADALIMUMAB 40 MG/0.8ML ~~LOC~~ AJKT: 40.0000 mg | AUTO-INJECTOR | SUBCUTANEOUS | 0 refills | Status: DC

## 2017-05-01 ENCOUNTER — Other Ambulatory Visit: Payer: Self-pay | Admitting: Rheumatology

## 2017-05-02 NOTE — Telephone Encounter (Signed)
Last Visit: 04/20/17 Next Visit: 09/21/17 Labs: 04/20/17 Glucose is very elevated. All other labs are WNL. Tb Gold: 12/29/16 Neg   Okay to refill per Dr. Corliss Skains

## 2017-05-04 ENCOUNTER — Telehealth: Payer: Self-pay | Admitting: Rheumatology

## 2017-05-04 NOTE — Telephone Encounter (Signed)
Prescription was sent in on 05/02/17 and called pharmacy to verify it was received. They are pulling the prescription to process it.

## 2017-05-04 NOTE — Telephone Encounter (Signed)
[facility] to a refill on patients  Humira . Patient ref# [id number] fax# [phone]

## 2017-07-20 ENCOUNTER — Other Ambulatory Visit: Payer: Self-pay | Admitting: Rheumatology

## 2017-07-20 NOTE — Telephone Encounter (Signed)
Last Visit: 04/20/17 Next Visit: 09/21/17 Labs: 04/20/17 Glucose is very elevated. All other labs are WNL. Tb Gold: 12/29/16 Neg   Left message to advise patient she is due for labs.   Okay to refill 30 day supply per Dr. Corliss Skains

## 2017-07-21 ENCOUNTER — Other Ambulatory Visit: Payer: Self-pay

## 2017-07-21 ENCOUNTER — Telehealth: Payer: Self-pay | Admitting: Rheumatology

## 2017-07-21 DIAGNOSIS — Z79899 Other long term (current) drug therapy: Secondary | ICD-10-CM

## 2017-07-21 LAB — CBC WITH DIFFERENTIAL/PLATELET
Basophils Absolute: 29 cells/uL (ref 0–200)
Basophils Relative: 0.5 %
Eosinophils Absolute: 296 cells/uL (ref 15–500)
Eosinophils Relative: 5.2 %
HCT: 37.4 % (ref 35.0–45.0)
Hemoglobin: 12.1 g/dL (ref 11.7–15.5)
Lymphs Abs: 1146 cells/uL (ref 850–3900)
MCH: 30 pg (ref 27.0–33.0)
MCHC: 32.4 g/dL (ref 32.0–36.0)
MCV: 92.6 fL (ref 80.0–100.0)
MPV: 10.3 fL (ref 7.5–12.5)
Monocytes Relative: 7 %
Neutro Abs: 3830 cells/uL (ref 1500–7800)
Neutrophils Relative %: 67.2 %
Platelets: 156 10*3/uL (ref 140–400)
RBC: 4.04 10*6/uL (ref 3.80–5.10)
RDW: 13.6 % (ref 11.0–15.0)
Total Lymphocyte: 20.1 %
WBC mixed population: 399 cells/uL (ref 200–950)
WBC: 5.7 10*3/uL (ref 3.8–10.8)

## 2017-07-21 LAB — COMPLETE METABOLIC PANEL WITH GFR
AG Ratio: 1.2 (calc) (ref 1.0–2.5)
ALT: 21 U/L (ref 6–29)
AST: 29 U/L (ref 10–35)
Albumin: 3.8 g/dL (ref 3.6–5.1)
Alkaline phosphatase (APISO): 109 U/L (ref 33–130)
BUN: 16 mg/dL (ref 7–25)
CO2: 28 mmol/L (ref 20–32)
Calcium: 9.4 mg/dL (ref 8.6–10.4)
Chloride: 104 mmol/L (ref 98–110)
Creat: 0.85 mg/dL (ref 0.50–0.99)
GFR, Est African American: 85 mL/min/{1.73_m2} (ref >=60)
GFR, Est Non African American: 73 mL/min/{1.73_m2} (ref >=60)
Globulin: 3.1 g/dL (calc) (ref 1.9–3.7)
Glucose, Bld: 308 mg/dL — ABNORMAL HIGH (ref 65–99)
Potassium: 5 mmol/L (ref 3.5–5.3)
Sodium: 139 mmol/L (ref 135–146)
Total Bilirubin: 0.4 mg/dL (ref 0.2–1.2)
Total Protein: 6.9 g/dL (ref 6.1–8.1)

## 2017-07-21 NOTE — Telephone Encounter (Signed)
Patient wants to try to get rx for Humira Citrate Free injections. Also, patient needs new discount card for Humira and Resuvio. Please call to advise.

## 2017-07-24 MED ORDER — METHOTREXATE (PF) 20 MG/0.4ML ~~LOC~~ SOAJ: SUBCUTANEOUS | 0 refills | Status: DC

## 2017-07-24 MED ORDER — ADALIMUMAB 40 MG/0.4ML ~~LOC~~ AJKT: 40.0000 mg | AUTO-INJECTOR | SUBCUTANEOUS | 0 refills | Status: DC

## 2017-07-24 NOTE — Telephone Encounter (Signed)
Last Visit: 04/20/17 Next Visit: 09/21/17 Tb Gold: 12/29/16 Neg  Labs: 07/21/16 Glucose is 308. Please notify patient. All other labs are WNL.  Okay to refill per Dr. Corliss Skains

## 2017-09-07 NOTE — Progress Notes (Signed)
Office Visit Note  Patient: Connie Ross             Date of Birth: [birth year]           MRN: [id number]             PCP: Iona Beard, MD Referring: Iona Beard, MD Visit Date: 09/21/2017 Occupation: _0 @  Subjective:  Right ankle pain   History of Present Illness: Connie Ross is a 62 y.o. female with history of seronegative arthritis and DDD.  She is on Rasuvo 20 mg sq once weekly, folic acid 2 mg daily, and Humira 40 mg sq injections every 14 days.   She denies any recent rheumatoid arthritis flares.  She continues to have chronic right ankle pain.  She has seen a podiatrist in the past who would not perform surgery per patient.  She was encouraged to go to physical therapy but has not had time recently due to family stress.  She continues to have chronic lower back pain as well.  She denies any other joint pain or joint swelling at this point time.  She denies any joint stiffness.  She states that her hydrocodone provides significant pain relief.  She states that her bilateral knee replacements are doing well.  She continues to walk with a cane on a daily basis.  She reports that she needs medication refills today.  Activities of Daily Living:  Patient reports morning stiffness for 0 minutes.   Patient Reports nocturnal pain.  Difficulty dressing/grooming: Denies Difficulty climbing stairs: Reports Difficulty getting out of chair: Reports Difficulty using hands for taps, buttons, cutlery, and/or writing: Reports  Review of Systems  Constitutional: Negative for fatigue.  HENT: Positive for mouth dryness. Negative for mouth sores, trouble swallowing, trouble swallowing and nose dryness.   Eyes: Negative for pain, redness, visual disturbance and dryness.  Respiratory: Negative for cough, hemoptysis, shortness of breath, wheezing and difficulty breathing.   Cardiovascular: Negative for chest pain, palpitations, hypertension and swelling in legs/feet.    Gastrointestinal: Negative for abdominal pain, blood in stool, constipation, diarrhea, nausea and vomiting.  Endocrine: Negative for increased urination.  Genitourinary: Negative for painful urination and pelvic pain.  Musculoskeletal: Positive for arthralgias and joint pain. Negative for joint swelling, myalgias, muscle weakness, morning stiffness, muscle tenderness and myalgias.  Skin: Positive for hair loss. Negative for color change, pallor, rash, nodules/bumps, skin tightness, ulcers and sensitivity to sunlight.  Allergic/Immunologic: Negative for susceptible to infections.  Neurological: Negative for dizziness, light-headedness, numbness, headaches, memory loss and weakness.  Hematological: Negative for bruising/bleeding tendency and swollen glands.  Psychiatric/Behavioral: Positive for depressed mood. Negative for confusion and sleep disturbance. The patient is not nervous/anxious.     PMFS History:  Patient Active Problem List   Diagnosis Date Noted  . Pain in right foot 04/06/2016  . Rheumatoid arthritis of multiple sites with negative rheumatoid factor ([place]) 02/03/2016  . High risk medication use 02/03/2016  . 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 Essential hypertension 02/03/2016  . History of diabetes mellitus 02/03/2016  . History of diabetic neuropathy 02/03/2016  . Dyslipidemia 02/03/2016  . Vitamin D deficiency 02/03/2016  . Abnormality of gait 03/15/2013  . Right knee pain 03/15/2013  . History of total knee replacement, bilateral 02/27/2013    Past Medical History:  Diagnosis Date  . Anxiety    takes no meds  . DVT (deep venous thrombosis) ([place])   . GERD (gastroesophageal reflux disease)   . High cholesterol   .  Hypertension   . Rheumatoid arthritis ([place])   . Type II diabetes mellitus ([place])     Family History  Adopted: Yes  Problem Relation Age of Onset  . Cancer Mother        breast  . Healthy Son   . Healthy Daughter   . Healthy Daughter     Past Surgical History:  Procedure Laterality Date  . EYE SURGERY     bilateral cataracts  . TOTAL KNEE ARTHROPLASTY Right 02/28/2013  . TOTAL KNEE ARTHROPLASTY Right 02/27/2013   Procedure: TOTAL KNEE ARTHROPLASTY- knee;  Surgeon: Newt Minion, MD;  Location: [facility];  Service: Orthopedics;  Laterality: Right;  Right Total Knee Arthroplasty  . TOTAL KNEE ARTHROPLASTY Left 07/04/2014   Procedure: LEFT TOTAL KNEE ARTHROPLASTY;  Surgeon: Newt Minion, MD;  Location: [facility];  Service: Orthopedics;  Laterality: Left;   Social History   Social History Narrative  . Not on file    Objective: Vital Signs: BP (!) 147/87 (BP Location: Right Arm, Patient Position: Sitting, Cuff Size: Normal)   Pulse 81   Resp 15   Ht 5' 2" (1.575 m)   Wt 206 lb (93.4 kg)   LMP  (LMP Unknown)   BMI 37.68 kg/m    Physical Exam  Constitutional: She is oriented to person, place, and time. She appears well-developed and well-nourished.  HENT:  Head: Normocephalic and atraumatic.  Eyes: Conjunctivae and EOM are normal.  Neck: Normal range of motion.  Cardiovascular: Normal rate, regular rhythm, normal heart sounds and intact distal pulses.  Pulmonary/Chest: Effort normal and breath sounds normal.  Abdominal: Soft. Bowel sounds are normal.  Lymphadenopathy:    She has no cervical adenopathy.  Neurological: She is alert and oriented to person, place, and time.  Skin: Skin is warm and dry. Capillary refill takes less than 2 seconds.  Psychiatric: She has a normal mood and affect. Her behavior is normal.  Nursing note and vitals reviewed.    Musculoskeletal Exam: C-spine good range of motion.  Limited range of motion lumbar spine.  Shoulder joints, elbow joints, wrist joints, MCPs good range of motion with no synovitis.  She synovial thickening of MCP joints but no synovitis.  She has hyperextension of PIP joints.  Hip joints good range of motion with no discomfort.  Bilateral knee replacements doing well.  No  warmth or effusion noted.  She has subluxation of the tibiotalar joint of the right ankle.  No synovitis noted.  CDAI Exam: CDAI Score: 1  Patient Global Assessment: 5 (mm); Provider Global Assessment: 5 (mm) Swollen: 0 ; Tender: 1  Joint Exam      Right  Left  Ankle   Tender        Investigation: No additional findings.  Imaging: No results found.  Recent Labs: Lab Results  Component Value Date   WBC 5.7 07/21/2017   HGB 12.1 07/21/2017   PLT 156 07/21/2017   NA 139 07/21/2017   K 5.0 07/21/2017   CL 104 07/21/2017   CO2 28 07/21/2017   GLUCOSE 308 (H) 07/21/2017   BUN 16 07/21/2017   CREATININE 0.85 07/21/2017   BILITOT 0.4 07/21/2017   ALKPHOS 102 06/20/2016   AST 29 07/21/2017   ALT 21 07/21/2017   PROT 6.9 07/21/2017   ALBUMIN 3.7 06/20/2016   CALCIUM 9.4 07/21/2017   GFRAA 85 07/21/2017   QFTBGOLDPLUS NEGATIVE 12/29/2016    Speciality Comments: No specialty comments available.  Procedures:  No procedures performed  Allergies: Patient has no known allergies.   Assessment / Plan:     Visit Diagnoses: Rheumatoid arthritis of multiple sites with negative rheumatoid factor (HCC) - RF negative, CCP negative, elevated ESR, positive ANA, positive Ro: She has no synovitis on exam.  She has not had any recent rheumatoid arthritis flares.  She is clinically doing well on Rasuvo 20 mg sq injections every week, folic acid 2 mg daily, and Humira 40 mg sq injections every 14 days.  Refills of Humira and Rasuvo were sent to the pharmacy.  She was advised to notify us if she develops increased joint pain or joint swelling.  She will follow up in 5 months.   High risk medication use - Rasuvo 20 mg subcutaneous every week, folic acid 2 mg by mouth daily, Humira 40 mg subcutaneous every other week -Patient requested to have lab work drawn today since her insurance will be changing soon.  - Plan: COMPLETE METABOLIC PANEL WITH GFR, CBC with Differential/Platelet  History of total  knee replacement, bilateral: Doing well.  No warmth or effusion noted.  She has good ROM with no discomfort.    DDD (degenerative disc disease), lumbar: Chronic pain.  She takes Hydrocodone for pain relief.    Other medical conditions are listed as follows:   History of vitamin D deficiency  Dyslipidemia  History of diabetes mellitus  History of hypertension  History of diabetic neuropathy   Orders: Orders Placed This Encounter  Procedures  . COMPLETE METABOLIC PANEL WITH GFR  . CBC with Differential/Platelet   Meds ordered this encounter  Medications  . Adalimumab (HUMIRA PEN) 40 MG/0.4ML PNKT    Sig: Inject 40 mg into the skin every 14 (fourteen) days.    Dispense:  3 each    Refill:  0    3 kits- 6 pens  . Methotrexate, PF, (RASUVO) 20 MG/0.4ML SOAJ    Sig: INJECT ONE PEN SUBCUTANEOUSLY ONCE EVERY WEEK. STORE AT ROOM TEMPERATURE BETWEEN 68 - 52 DEGREES F.    Dispense:  12 pen    Refill:  0      Follow-Up Instructions: Return for Rheumatoid arthritis, DDD.   Ofilia Neas, PA-C  Note - This record has been created using Dragon software.  Chart creation errors have been sought, but may not always  have been located. Such creation errors do not reflect on  the standard of medical care.

## 2017-09-21 ENCOUNTER — Ambulatory Visit (INDEPENDENT_AMBULATORY_CARE_PROVIDER_SITE_OTHER): Payer: BLUE CROSS/BLUE SHIELD | Admitting: Physician Assistant

## 2017-09-21 ENCOUNTER — Encounter: Payer: Self-pay | Admitting: Physician Assistant

## 2017-09-21 VITALS — BP 147/87 | HR 81 | Resp 15 | Ht 62.0 in | Wt 206.0 lb

## 2017-09-21 DIAGNOSIS — Z79899 Other long term (current) drug therapy: Secondary | ICD-10-CM | POA: Diagnosis not present

## 2017-09-21 DIAGNOSIS — E785 Hyperlipidemia, unspecified: Secondary | ICD-10-CM | POA: Diagnosis not present

## 2017-09-21 DIAGNOSIS — Z8679 Personal history of other diseases of the circulatory system: Secondary | ICD-10-CM

## 2017-09-21 DIAGNOSIS — Z8639 Personal history of other endocrine, nutritional and metabolic disease: Secondary | ICD-10-CM | POA: Diagnosis not present

## 2017-09-21 DIAGNOSIS — M5136 Other intervertebral disc degeneration, lumbar region: Secondary | ICD-10-CM

## 2017-09-21 DIAGNOSIS — Z96653 Presence of artificial knee joint, bilateral: Secondary | ICD-10-CM | POA: Diagnosis not present

## 2017-09-21 DIAGNOSIS — M0609 Rheumatoid arthritis without rheumatoid factor, multiple sites: Secondary | ICD-10-CM

## 2017-09-21 LAB — CBC WITH DIFFERENTIAL/PLATELET
Basophils Absolute: 40 cells/uL (ref 0–200)
Basophils Relative: 0.7 %
Eosinophils Absolute: 456 cells/uL (ref 15–500)
Eosinophils Relative: 8 %
HCT: 36.7 % (ref 35.0–45.0)
Hemoglobin: 12 g/dL (ref 11.7–15.5)
Lymphs Abs: 1459 cells/uL (ref 850–3900)
MCH: 30.2 pg (ref 27.0–33.0)
MCHC: 32.7 g/dL (ref 32.0–36.0)
MCV: 92.4 fL (ref 80.0–100.0)
MPV: 10.2 fL (ref 7.5–12.5)
Monocytes Relative: 7.8 %
Neutro Abs: 3300 cells/uL (ref 1500–7800)
Neutrophils Relative %: 57.9 %
Platelets: 193 10*3/uL (ref 140–400)
RBC: 3.97 10*6/uL (ref 3.80–5.10)
RDW: 13.5 % (ref 11.0–15.0)
Total Lymphocyte: 25.6 %
WBC mixed population: 445 cells/uL (ref 200–950)
WBC: 5.7 10*3/uL (ref 3.8–10.8)

## 2017-09-21 LAB — COMPLETE METABOLIC PANEL WITH GFR
AG Ratio: 1.3 (calc) (ref 1.0–2.5)
ALT: 18 U/L (ref 6–29)
AST: 20 U/L (ref 10–35)
Albumin: 3.9 g/dL (ref 3.6–5.1)
Alkaline phosphatase (APISO): 111 U/L (ref 33–130)
BUN: 14 mg/dL (ref 7–25)
CO2: 25 mmol/L (ref 20–32)
Calcium: 9.4 mg/dL (ref 8.6–10.4)
Chloride: 101 mmol/L (ref 98–110)
Creat: 0.77 mg/dL (ref 0.50–0.99)
GFR, Est African American: 96 mL/min/{1.73_m2} (ref >=60)
GFR, Est Non African American: 83 mL/min/{1.73_m2} (ref >=60)
Globulin: 3.1 g/dL (calc) (ref 1.9–3.7)
Glucose, Bld: 327 mg/dL — ABNORMAL HIGH (ref 65–99)
Potassium: 4.7 mmol/L (ref 3.5–5.3)
Sodium: 138 mmol/L (ref 135–146)
Total Bilirubin: 0.3 mg/dL (ref 0.2–1.2)
Total Protein: 7 g/dL (ref 6.1–8.1)

## 2017-09-21 MED ORDER — ADALIMUMAB 40 MG/0.4ML ~~LOC~~ AJKT: 40.0000 mg | AUTO-INJECTOR | SUBCUTANEOUS | 0 refills | Status: DC

## 2017-09-21 MED ORDER — METHOTREXATE (PF) 20 MG/0.4ML ~~LOC~~ SOAJ: SUBCUTANEOUS | 0 refills | Status: DC

## 2017-09-21 NOTE — Patient Instructions (Addendum)
Standing Labs We placed an order today for your standing lab work.    Please come back and get your standing labs in December and every 3 months   We have open lab Monday through Friday from 8:30-11:30 AM and 1:30-4:00 PM  at the office of Dr. Shaili Deveshwar.   You may experience shorter wait times on Monday and Friday afternoons. The office is located at [address], [address], [place], [place] [address] No appointment is necessary.   Labs are drawn by [facility].  You may receive a bill from [facility] for your lab work. If you have any questions regarding directions or hours of operation,  please call [phone].     

## 2017-09-22 NOTE — Progress Notes (Signed)
Glucose is very elevated-327.  Please notify patient.  All other labs are WNL.

## 2018-02-05 NOTE — Progress Notes (Signed)
Office Visit Note  Patient: Connie Ross             Date of Birth: 1956/01/04           MRN: [id number]             PCP: Iona Beard, MD Referring: Iona Beard, MD Visit Date: 02/19/2018 Occupation: _0 @  Subjective:  Right ankle pain   History of Present Illness: Connie Ross is a 63 y.o. female with history of seronegative rheumatoid arthritis and DDD.  She reports that she has been off of Humira and Rasuvo since November due to insurance changes.  She reports that she is having pain and swelling in the right ankle.  She states that she has chronic pain in this joint.  She states that she would like to restart on her medications.  She continues with chronic lower back pain but denies any symptoms of sciatica or radiculopathy.  She reports of bilateral knee replacements are doing well.  She continues to walk with a cane.  She denies any other joint pain or joint swelling at this time.   Activities of Daily Living:  Patient reports morning stiffness for 20-30   minutes.   Patient Denies nocturnal pain.  Difficulty dressing/grooming: Denies Difficulty climbing stairs: Reports Difficulty getting out of chair: Reports Difficulty using hands for taps, buttons, cutlery, and/or writing: Denies  Review of Systems  Constitutional: Positive for fatigue.  HENT: Positive for mouth dryness. Negative for mouth sores and nose dryness.   Eyes: Negative for pain, visual disturbance and dryness.  Respiratory: Negative for cough, hemoptysis, shortness of breath and difficulty breathing.   Cardiovascular: Negative for chest pain, palpitations, hypertension and swelling in legs/feet.  Gastrointestinal: Negative for blood in stool, constipation and diarrhea.  Endocrine: Negative for increased urination.  Genitourinary: Negative for painful urination.  Musculoskeletal: Positive for arthralgias, joint pain, joint swelling and morning stiffness. Negative for myalgias, muscle  weakness, muscle tenderness and myalgias.  Skin: Negative for color change, pallor, rash, hair loss, nodules/bumps, skin tightness, ulcers and sensitivity to sunlight.  Allergic/Immunologic: Negative for susceptible to infections.  Neurological: Negative for dizziness, numbness, headaches and weakness.  Hematological: Negative for swollen glands.  Psychiatric/Behavioral: Positive for sleep disturbance. Negative for depressed mood. The patient is not nervous/anxious.     PMFS History:  Patient Active Problem List   Diagnosis Date Noted  . Pain in right foot 04/06/2016  . Rheumatoid arthritis of multiple sites with negative rheumatoid factor ([place]) 02/03/2016  . High risk medication use 02/03/2016  . 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 Essential hypertension 02/03/2016  . History of diabetes mellitus 02/03/2016  . History of diabetic neuropathy 02/03/2016  . Dyslipidemia 02/03/2016  . Vitamin D deficiency 02/03/2016  . Abnormality of gait 03/15/2013  . Right knee pain 03/15/2013  . History of total knee replacement, bilateral 02/27/2013    Past Medical History:  Diagnosis Date  . Anxiety    takes no meds  . DVT (deep venous thrombosis) ([place])   . GERD (gastroesophageal reflux disease)   . High cholesterol   . Hypertension   . Rheumatoid arthritis ([place])   . Type II diabetes mellitus ([place])     Family History  Adopted: Yes  Problem Relation Age of Onset  . Cancer Mother        breast  . Healthy Son   . Healthy Daughter   . Healthy Daughter    Past Surgical History:  Procedure Laterality Date  .  EYE SURGERY     bilateral cataracts  . TOTAL KNEE ARTHROPLASTY Right 02/28/2013  . TOTAL KNEE ARTHROPLASTY Right 02/27/2013   Procedure: TOTAL KNEE ARTHROPLASTY- knee;  Surgeon: Newt Minion, MD;  Location: [facility];  Service: Orthopedics;  Laterality: Right;  Right Total Knee Arthroplasty  . TOTAL KNEE ARTHROPLASTY Left 07/04/2014   Procedure: LEFT TOTAL KNEE ARTHROPLASTY;   Surgeon: Newt Minion, MD;  Location: [facility];  Service: Orthopedics;  Laterality: Left;   Social History   Social History Narrative  . Not on file   Immunization History  Administered Date(s) Administered  . Influenza-Unspecified 09/03/2012  . Pneumococcal Polysaccharide-23 03/01/2013     Objective: Vital Signs: BP 129/69 (BP Location: Left Arm, Patient Position: Sitting, Cuff Size: Normal)   Pulse 73   Resp 14   Ht 5' 1" (1.549 m)   Wt 206 lb (93.4 kg)   LMP  (LMP Unknown)   BMI 38.92 kg/m    Physical Exam Vitals signs and nursing note reviewed.  Constitutional:      Appearance: She is well-developed.  HENT:     Head: Normocephalic and atraumatic.  Eyes:     Conjunctiva/sclera: Conjunctivae normal.  Neck:     Musculoskeletal: Normal range of motion.  Cardiovascular:     Rate and Rhythm: Normal rate and regular rhythm.     Heart sounds: Normal heart sounds.  Pulmonary:     Effort: Pulmonary effort is normal.     Breath sounds: Normal breath sounds.  Abdominal:     General: Bowel sounds are normal.     Palpations: Abdomen is soft.  Lymphadenopathy:     Cervical: No cervical adenopathy.  Skin:    General: Skin is warm and dry.     Capillary Refill: Capillary refill takes less than 2 seconds.  Neurological:     Mental Status: She is alert and oriented to person, place, and time.  Psychiatric:        Behavior: Behavior normal.      Musculoskeletal Exam: C-spine good range of motion.  Mild thoracic kyphosis noted.  Limited range of motion of lumbar spine with discomfort.  Shoulder joints, elbow joints, wrist joints, MCPs, PIPs, DIPs good range of motion with no synovitis.  She has synovial thickening of MCP joints but no synovitis or tenderness.  She has hyperextension of the PIP joints.  Hip joints, knee joints, ankle joints, MTPs, PIPs, DIPs good range of motion no synovitis.  Bilateral replacements are doing well. She has subluxation of right tibiotalar joint.  Tenderness of the right ankle joint.    CDAI Exam: CDAI Score: Not documented Patient Global Assessment: Not documented; Provider Global Assessment: Not documented Swollen: 2 ; Tender: 2  Joint Exam      Right  Left  Ankle  Swollen Tender     Subtalar  Swollen Tender        Investigation: No additional findings.  Imaging: No results found.  Recent Labs: Lab Results  Component Value Date   WBC 5.7 09/21/2017   HGB 12.0 09/21/2017   PLT 193 09/21/2017   NA 138 09/21/2017   K 4.7 09/21/2017   CL 101 09/21/2017   CO2 25 09/21/2017   GLUCOSE 327 (H) 09/21/2017   BUN 14 09/21/2017   CREATININE 0.77 09/21/2017   BILITOT 0.3 09/21/2017   ALKPHOS 102 06/20/2016   AST 20 09/21/2017   ALT 18 09/21/2017   PROT 7.0 09/21/2017   ALBUMIN 3.7 06/20/2016   CALCIUM  9.4 09/21/2017   GFRAA 96 09/21/2017   QFTBGOLDPLUS NEGATIVE 12/29/2016    Speciality Comments: No specialty comments available.  Procedures:  No procedures performed Allergies: Patient has no known allergies.   Assessment / Plan:     Visit Diagnoses: Rheumatoid arthritis of multiple sites with negative rheumatoid factor (HCC) - RF negative, CCP negative, elevated ESR, positive ANA, positive Ro: She has tenderness and subluxation of the right tibiotalar joint.  She has been having intermittent joint swelling in the right ankle joint.  She has chronic pain in the right ankle.  She has synovial thickening of MCP joints but no tenderness or synovitis on exam.  She has no other joint pain or joint swelling at this time.  She has morning stiffness lasting 20 to 30 minutes.  She has been off of Rasuvo and Humira since November 2019 due to insurance changes.  We will obtain CBC, CMP, and TB gold today.  If lab work is stable and TB gold is negative we will schedule nurse visit to inject Humira in the office.   We will be doing a prior authorization for Rasuvo 20 mg sq injections once weekly and Humira 40 mg sq injections every 2  weeks in the meantime.  She was advised to notify us if she develops any increased joint pain or joint swelling.  She will follow-up in the office in 5 months.  High risk medication use - Rasuvo 20 mg subcutaneous every week, folic acid 2 mg by mouth daily, Humira 40 mg subcutaneous every other week.  CBC and CMP were drawn today.  TB gold was also drawn.  We will wait for lab work to return prior to scheduling a nurse visit for the injection of Humira.  She will return for lab work in May and every 3 months to monitor for drug toxicity.  She has not had any recent infections.- Plan: QuantiFERON-TB Gold Plus, CBC with Differential/Platelet, COMPLETE METABOLIC PANEL WITH GFR  History of total knee replacement, bilateral: Doing well.  No warmth or effusion of knee joints.  She has good ROM with no discomfort.  She walks with a cane.   DDD (degenerative disc disease), lumbar: Chronic pain.  No symptoms of radiculopathy.    Other medical conditions are listed as follows:   History of vitamin D deficiency  Dyslipidemia  History of diabetes mellitus  History of hypertension  History of diabetic neuropathy   Orders: Orders Placed This Encounter  Procedures  . QuantiFERON-TB Gold Plus  . CBC with Differential/Platelet  . COMPLETE METABOLIC PANEL WITH GFR   No orders of the defined types were placed in this encounter.   Follow-Up Instructions: Return in about 5 months (around 07/20/2018) for Rheumatoid arthritis, DDD.   Ofilia Neas, PA-C  Note - This record has been created using Dragon software.  Chart creation errors have been sought, but may not always  have been located. Such creation errors do not reflect on  the standard of medical care.

## 2018-02-19 ENCOUNTER — Encounter: Payer: Self-pay | Admitting: Physician Assistant

## 2018-02-19 ENCOUNTER — Ambulatory Visit (INDEPENDENT_AMBULATORY_CARE_PROVIDER_SITE_OTHER): Payer: BLUE CROSS/BLUE SHIELD | Admitting: Physician Assistant

## 2018-02-19 ENCOUNTER — Telehealth: Payer: Self-pay | Admitting: Pharmacy Technician

## 2018-02-19 VITALS — BP 129/69 | HR 73 | Resp 14 | Ht 61.0 in | Wt 206.0 lb

## 2018-02-19 DIAGNOSIS — Z8679 Personal history of other diseases of the circulatory system: Secondary | ICD-10-CM

## 2018-02-19 DIAGNOSIS — E785 Hyperlipidemia, unspecified: Secondary | ICD-10-CM | POA: Diagnosis not present

## 2018-02-19 DIAGNOSIS — Z79899 Other long term (current) drug therapy: Secondary | ICD-10-CM

## 2018-02-19 DIAGNOSIS — M5136 Other intervertebral disc degeneration, lumbar region: Secondary | ICD-10-CM | POA: Diagnosis not present

## 2018-02-19 DIAGNOSIS — M51369 Other intervertebral disc degeneration, lumbar region without mention of lumbar back pain or lower extremity pain: Secondary | ICD-10-CM

## 2018-02-19 DIAGNOSIS — Z8639 Personal history of other endocrine, nutritional and metabolic disease: Secondary | ICD-10-CM | POA: Diagnosis not present

## 2018-02-19 DIAGNOSIS — Z96653 Presence of artificial knee joint, bilateral: Secondary | ICD-10-CM | POA: Diagnosis not present

## 2018-02-19 DIAGNOSIS — M0609 Rheumatoid arthritis without rheumatoid factor, multiple sites: Secondary | ICD-10-CM | POA: Diagnosis not present

## 2018-02-19 NOTE — Telephone Encounter (Signed)
Received a Prior Authorization request from [employer] for RASUVO. Authorization has been submitted to patient's insurance via Cover My Meds. Will update once we receive a response.  

## 2018-02-19 NOTE — Telephone Encounter (Signed)
Received a Prior Authorization request from [employer] for HUMIRA. Authorization has been submitted to patient's insurance via Cover My Meds. Will update once we receive a response.  

## 2018-02-20 NOTE — Progress Notes (Signed)
CBC WNL. Glucose is 314. Rest of lab work is WNL.

## 2018-02-21 LAB — CBC WITH DIFFERENTIAL/PLATELET
Absolute Monocytes: 362 cells/uL (ref 200–950)
Basophils Absolute: 40 cells/uL (ref 0–200)
Basophils Relative: 0.6 %
Eosinophils Absolute: 436 cells/uL (ref 15–500)
Eosinophils Relative: 6.5 %
HCT: 38.8 % (ref 35.0–45.0)
Hemoglobin: 13.4 g/dL (ref 11.7–15.5)
Lymphs Abs: 1809 cells/uL (ref 850–3900)
MCH: 30.7 pg (ref 27.0–33.0)
MCHC: 34.5 g/dL (ref 32.0–36.0)
MCV: 88.8 fL (ref 80.0–100.0)
MPV: 11.2 fL (ref 7.5–12.5)
Monocytes Relative: 5.4 %
Neutro Abs: 4054 cells/uL (ref 1500–7800)
Neutrophils Relative %: 60.5 %
Platelets: 208 10*3/uL (ref 140–400)
RBC: 4.37 10*6/uL (ref 3.80–5.10)
RDW: 12.6 % (ref 11.0–15.0)
Total Lymphocyte: 27 %
WBC: 6.7 10*3/uL (ref 3.8–10.8)

## 2018-02-21 LAB — COMPLETE METABOLIC PANEL WITH GFR
AG Ratio: 1.1 (calc) (ref 1.0–2.5)
ALT: 14 U/L (ref 6–29)
AST: 15 U/L (ref 10–35)
Albumin: 3.9 g/dL (ref 3.6–5.1)
Alkaline phosphatase (APISO): 110 U/L (ref 37–153)
BUN: 21 mg/dL (ref 7–25)
CO2: 25 mmol/L (ref 20–32)
Calcium: 9.4 mg/dL (ref 8.6–10.4)
Chloride: 100 mmol/L (ref 98–110)
Creat: 0.78 mg/dL (ref 0.50–0.99)
GFR, Est African American: 94 mL/min/{1.73_m2} (ref >=60)
GFR, Est Non African American: 81 mL/min/{1.73_m2} (ref >=60)
Globulin: 3.4 g/dL (calc) (ref 1.9–3.7)
Glucose, Bld: 314 mg/dL — ABNORMAL HIGH (ref 65–99)
Potassium: 4.5 mmol/L (ref 3.5–5.3)
Sodium: 136 mmol/L (ref 135–146)
Total Bilirubin: 0.3 mg/dL (ref 0.2–1.2)
Total Protein: 7.3 g/dL (ref 6.1–8.1)

## 2018-02-21 LAB — QUANTIFERON-TB GOLD PLUS
Mitogen-NIL: >10 IU/mL
NIL: 0.02 IU/mL
QuantiFERON-TB Gold Plus: NEGATIVE
TB1-NIL: 0 IU/mL
TB2-NIL: 0 IU/mL

## 2018-02-21 NOTE — Progress Notes (Signed)
TB gold negative. Please call patient to schedule nurse visit to restart on Humira in the office.

## 2018-02-21 NOTE — Progress Notes (Signed)
Patient notified and scheduled for 02/22/18 at 10AM.

## 2018-02-22 ENCOUNTER — Ambulatory Visit (INDEPENDENT_AMBULATORY_CARE_PROVIDER_SITE_OTHER): Payer: Medicare Other | Admitting: Pharmacist

## 2018-02-22 VITALS — BP 119/66 | HR 75 | Temp 98.4°F

## 2018-02-22 DIAGNOSIS — Z79899 Other long term (current) drug therapy: Secondary | ICD-10-CM | POA: Diagnosis not present

## 2018-02-22 DIAGNOSIS — M0609 Rheumatoid arthritis without rheumatoid factor, multiple sites: Secondary | ICD-10-CM | POA: Diagnosis not present

## 2018-02-22 MED ORDER — ADALIMUMAB 40 MG/0.4ML ~~LOC~~ AJKT: 40.0000 mg | AUTO-INJECTOR | SUBCUTANEOUS | 0 refills | Status: DC

## 2018-02-22 MED ORDER — METHOTREXATE (PF) 20 MG/0.4ML ~~LOC~~ SOAJ: 20.0000 mg | SUBCUTANEOUS | 0 refills | Status: DC

## 2018-02-22 NOTE — Addendum Note (Signed)
Addended by: Verlin Fester C on: 02/22/2018 11:02 AM   Modules accepted: Level of Service

## 2018-02-22 NOTE — Progress Notes (Signed)
.   Pharmacy Note  Subjective:   Patient presents to clinic today to receive the first dose of Humira. She is her with her caregiver (daughter) who administers her injectable medications. Patient has history of rheumatoid arthritis and will be resuming Humira after lapse in therapy greater than 3 months.  Patient was previously counseled extensively on and consented to initiation of Humira at that time.    Objective: CMP     Component Value Date/Time   NA 136 02/19/2018 1521   K 4.5 02/19/2018 1521   CL 100 02/19/2018 1521   CO2 25 02/19/2018 1521   GLUCOSE 314 (H) 02/19/2018 1521   BUN 21 02/19/2018 1521   CREATININE 0.78 02/19/2018 1521   CALCIUM 9.4 02/19/2018 1521   PROT 7.3 02/19/2018 1521   ALBUMIN 3.7 06/20/2016 1642   AST 15 02/19/2018 1521   ALT 14 02/19/2018 1521   ALKPHOS 102 06/20/2016 1642   BILITOT 0.3 02/19/2018 1521   GFRNONAA 81 02/19/2018 1521   GFRAA 94 02/19/2018 1521    CBC    Component Value Date/Time   WBC 6.7 02/19/2018 1521   RBC 4.37 02/19/2018 1521   HGB 13.4 02/19/2018 1521   HCT 38.8 02/19/2018 1521   PLT 208 02/19/2018 1521   MCV 88.8 02/19/2018 1521   MCH 30.7 02/19/2018 1521   MCHC 34.5 02/19/2018 1521   RDW 12.6 02/19/2018 1521   LYMPHSABS 1,809 02/19/2018 1521   MONOABS 402 06/20/2016 1642   EOSABS 436 02/19/2018 1521   BASOSABS 40 02/19/2018 1521    Baseline Immunosuppressant Therapy Labs Quantiferon TB Gold Latest Ref Rng & Units 02/19/2018  Quantiferon TB Gold Plus NEGATIVE NEGATIVE   HIV: negative 04/12/13 Hepatitis panel:negative 04/12/13 Immunoglobulins: pending next office visit SPEP:pending next office visit  Assessment/Plan:  Demonstrated proper injection technique with Humira demo pen.  Caregiver able to demonstrate proper injection technique using the teach back method.  Caregiver injected in the left arm injected in the Humira with:  Sample Medication: Humira 40 mg NDC: 2446-2863-81 Lot: [id number] Expiration:  05/04/2019 Qty: 1 and given 1 sample to take home  Patient tolerated well.  Observed for 30 mins in office for adverse reaction and none noted. Instructed patient to call with any questions/issues.     Provided patient with standing lab instructions and placed standing lab order.    Patient is also on Rasuvo 20 mg weekly. Medication Samples have been provided to the patient.  Drug name: Rasuvo 20 mg Qty: 3   LOT: [id number]   Exp.Date: 12/04/2018  Dosing instructions: Inject 1 pen weekly  Humira and Rasuvo both require prior authorization through insurance.  Prior authorization has been submitted to our office.  Fleet Contras, patient advocate called insurance and prior authorizations are still in process.  We will update patient when we receive a response.  [facility] must [facility] for her prescriptions.  Prescription for both Rasuvo and Humira sent.  All questions encouraged and answered.  Instructed patient to call with any further questions or concerns.  Verlin Fester, PharmD, [facility] Rheumatology Clinical Pharmacist  02/22/2018 10:55 AM

## 2018-02-22 NOTE — Patient Instructions (Addendum)
[facility]  Specialty Pharmacy Phone Number [phone]  Standing Labs We placed an order today for your standing lab work.    Please come back and get your standing labs in May and then every 3 months.  We have open lab Monday through Friday from 8:30-11:30 AM and 1:30-4:00 PM  at the office of Dr. Pollyann Savoy.   You may experience shorter wait times on Monday and Friday afternoons. The office is located at [address], [place], [place] [address] No appointment is necessary.   Labs are drawn by [employer] [facility] [employer].  You may receive a bill from [facility] for your lab work.  If you wish to have your labs drawn at another location, please call the office 24 hours in advance to send orders.  If you have any questions regarding directions or hours of operation,  please call [phone].   Just as a reminder please drink plenty of water prior to coming for your lab work. Thanks! Helpful Tips for Injecting To help alleviate pain and injection site reactions consider the following tips:  . Placing something cold (like and ice gel pack or cold water bottle) on the injection site just before cleansing with alcohol may help reduce pain . If you have a localized reaction (redness, mild swelling, warmth, and itching) you can use topical corticosteroids (hydrocortisone cream) or antihistamine (Claritin) to help minimize reaction the day of, the day before, and the day after injecting . Always inject this medication at room temperature (remove from refrigerator 15-20 minutes before injecting; this may help eliminate stinging). . Always rotate your injection sites using inside/outside thigh of both legs, abdomen divided into 4 quadrants (stay away from waist line, and 2" away from navel). . Do not inject into areas where skin is tender, bruised, red, or hard or where there are scars or stretch marks. . Always let alcohol dry on the skin before injecting. . Place a cold, damp towel or small ice  pack on the injection site for 10 or 15 minutes every 1 to 2 hours if it hurts or is swollen.

## 2018-02-26 NOTE — Telephone Encounter (Signed)
Received notification from [employer] that Rasuvo is non-formulary but does not require authorization. Ran test claim, 1 month of Rasuvo is $99.29.  Ran test claim for Otrexup and it is also nonformulary, 1 month supply is $130.81.  Patient has a Secondary school teacher, she is copay card eligible.

## 2018-02-26 NOTE — Telephone Encounter (Signed)
Humira prior authorization is still pending

## 2018-02-27 NOTE — Telephone Encounter (Signed)
Called to check status of Humira PA, was advised that it is still in review and that PA's through this plan can process up to 15 calendar days.

## 2018-03-05 NOTE — Telephone Encounter (Signed)
Left voicemail notifying of approval and the number to the pharmacy to schedule shipment.  Instructed them to call my desk if they have questions or trouble obtaining the medication.  

## 2018-03-05 NOTE — Telephone Encounter (Signed)
Left voicemail notifying of approval and the number to the pharmacy to schedule shipment.  Instructed them to call my desk if they have questions or trouble obtaining the medication.

## 2018-03-05 NOTE — Telephone Encounter (Signed)
Received a fax from [facility] regarding a prior authorization for HUMIRA. Authorization has been APPROVED from 02/28/2018 to 03/01/2019.   Will send document to scan center.  Authorization # [id number]

## 2018-06-07 ENCOUNTER — Other Ambulatory Visit: Payer: Self-pay | Admitting: *Deleted

## 2018-06-07 DIAGNOSIS — M0609 Rheumatoid arthritis without rheumatoid factor, multiple sites: Secondary | ICD-10-CM

## 2018-06-07 MED ORDER — METHOTREXATE (PF) 20 MG/0.4ML ~~LOC~~ SOAJ: 20.0000 mg | SUBCUTANEOUS | 0 refills | Status: DC

## 2018-06-07 MED ORDER — ADALIMUMAB 40 MG/0.4ML ~~LOC~~ AJKT: 40.0000 mg | AUTO-INJECTOR | SUBCUTANEOUS | 0 refills | Status: DC

## 2018-06-07 NOTE — Telephone Encounter (Signed)
ok 

## 2018-06-07 NOTE — Telephone Encounter (Signed)
Refill request received via fax  Last Visit: 02/19/18 Next Visit: 07/16/18 Labs: 02/19/18 CBC WNL. Glucose is 314. Rest of lab work is WNL. TB Gold: 02/19/18 Neg   Left message to advise patient she is due to update labs  Okay to refill 30 day supply Rasuvo and Humira

## 2018-06-11 ENCOUNTER — Other Ambulatory Visit: Payer: Self-pay

## 2018-06-11 DIAGNOSIS — Z79899 Other long term (current) drug therapy: Secondary | ICD-10-CM

## 2018-06-12 ENCOUNTER — Telehealth: Payer: Self-pay | Admitting: Rheumatology

## 2018-06-12 LAB — CBC WITH DIFFERENTIAL/PLATELET
Absolute Monocytes: 442 cells/uL (ref 200–950)
Basophils Absolute: 40 cells/uL (ref 0–200)
Basophils Relative: 0.6 %
Eosinophils Absolute: 673 cells/uL — ABNORMAL HIGH (ref 15–500)
Eosinophils Relative: 10.2 %
HCT: 38.6 % (ref 35.0–45.0)
Hemoglobin: 12.3 g/dL (ref 11.7–15.5)
Lymphs Abs: 1690 cells/uL (ref 850–3900)
MCH: 30.3 pg (ref 27.0–33.0)
MCHC: 31.9 g/dL — ABNORMAL LOW (ref 32.0–36.0)
MCV: 95.1 fL (ref 80.0–100.0)
MPV: 10.4 fL (ref 7.5–12.5)
Monocytes Relative: 6.7 %
Neutro Abs: 3755 cells/uL (ref 1500–7800)
Neutrophils Relative %: 56.9 %
Platelets: 169 10*3/uL (ref 140–400)
RBC: 4.06 10*6/uL (ref 3.80–5.10)
RDW: 13.5 % (ref 11.0–15.0)
Total Lymphocyte: 25.6 %
WBC: 6.6 10*3/uL (ref 3.8–10.8)

## 2018-06-12 LAB — COMPLETE METABOLIC PANEL WITH GFR
AG Ratio: 1.2 (calc) (ref 1.0–2.5)
ALT: 18 U/L (ref 6–29)
AST: 20 U/L (ref 10–35)
Albumin: 3.7 g/dL (ref 3.6–5.1)
Alkaline phosphatase (APISO): 85 U/L (ref 37–153)
BUN: 17 mg/dL (ref 7–25)
CO2: 25 mmol/L (ref 20–32)
Calcium: 9.2 mg/dL (ref 8.6–10.4)
Chloride: 101 mmol/L (ref 98–110)
Creat: 0.73 mg/dL (ref 0.50–0.99)
GFR, Est African American: 102 mL/min/{1.73_m2} (ref >=60)
GFR, Est Non African American: 88 mL/min/{1.73_m2} (ref >=60)
Globulin: 3.1 g/dL (calc) (ref 1.9–3.7)
Glucose, Bld: 408 mg/dL — ABNORMAL HIGH (ref 65–99)
Potassium: 4.3 mmol/L (ref 3.5–5.3)
Sodium: 135 mmol/L (ref 135–146)
Total Bilirubin: 0.4 mg/dL (ref 0.2–1.2)
Total Protein: 6.8 g/dL (ref 6.1–8.1)

## 2018-06-12 NOTE — Telephone Encounter (Signed)
[facility] calling with a stat lab for patient. Fasting Glucose 408.

## 2018-06-12 NOTE — Telephone Encounter (Signed)
Spoke with patient's daughter(who is on DPR) about lab results. (see lab note).  She will contact patient's PCP.

## 2018-07-02 NOTE — Progress Notes (Signed)
Office Visit Note  Patient: Connie Ross             Date of Birth: April 08, 1955           MRN: [id number]             PCP: Iona Beard, MD Referring: Iona Beard, MD Visit Date: 07/16/2018 Occupation: _0 @  Subjective:  Chronic right ankle joint pain     History of Present Illness: Connie Ross is a 63 y.o. female with history of seronegative rheumatoid arthritis and DDD.  She is on Humira 40 mg sq injections every 14 days, Rasuvo 20 mg sq injections every week, and folic acid 2 mg po daily.  She denies any recent rheumatoid arthritis flares.  She has noticed hammertoes developing in left foot. She has chronic right ankle joint pain and swelling.  She states the pain is worse when standing.  She walks with a cane.  She feels much better since restarting on Humira and Rasuvo.  She has chronic lower back pain, and she sees Dr. Sherwood Gambler.  She reports she needs surgery but is not ready to proceed with surgery.  She states both knee replacements are doing well.   Activities of Daily Living:  Patient reports morning stiffness for 2 hours.   Patient Reports nocturnal pain.  Difficulty dressing/grooming: Denies Difficulty climbing stairs: Reports Difficulty getting out of chair: Reports Difficulty using hands for taps, buttons, cutlery, and/or writing: Denies  Review of Systems  Constitutional: Negative for fatigue.  HENT: Positive for mouth dryness. Negative for mouth sores and nose dryness.   Eyes: Negative for pain, visual disturbance and dryness.  Respiratory: Negative for cough, hemoptysis, shortness of breath and difficulty breathing.   Cardiovascular: Negative for chest pain, palpitations, hypertension and swelling in legs/feet.  Gastrointestinal: Negative for blood in stool, constipation and diarrhea.  Endocrine: Negative for increased urination.  Genitourinary: Negative for painful urination.  Musculoskeletal: Positive for arthralgias, joint pain, joint  swelling and morning stiffness. Negative for myalgias, muscle weakness, muscle tenderness and myalgias.  Skin: Negative for color change, pallor, rash, hair loss, nodules/bumps, skin tightness, ulcers and sensitivity to sunlight.  Allergic/Immunologic: Negative for susceptible to infections.  Neurological: Negative for dizziness, numbness, headaches and weakness.  Hematological: Negative for swollen glands.  Psychiatric/Behavioral: Negative for depressed mood and sleep disturbance. The patient is not nervous/anxious.     PMFS History:  Patient Active Problem List   Diagnosis Date Noted   Pain in right foot 04/06/2016   Rheumatoid arthritis of multiple sites with negative rheumatoid factor ([place]) 02/03/2016   High risk medication use 02/03/2016   Osteoarthritis of lumbar spine 02/03/2016   Essential hypertension 02/03/2016   History of diabetes mellitus 02/03/2016   History of diabetic neuropathy 02/03/2016   Dyslipidemia 02/03/2016   Vitamin D deficiency 02/03/2016   Abnormality of gait 03/15/2013   Right knee pain 03/15/2013   History of total knee replacement, bilateral 02/27/2013    Past Medical History:  Diagnosis Date   Anxiety    takes no meds   DVT (deep venous thrombosis) (HCC)    GERD (gastroesophageal reflux disease)    High cholesterol    Hypertension    Rheumatoid arthritis ([place])    Type II diabetes mellitus ([place])     Family History  Adopted: Yes  Problem Relation Age of Onset   Cancer Mother        breast   Healthy Son    Healthy Daughter    Healthy  Daughter    Past Surgical History:  Procedure Laterality Date   EYE SURGERY     bilateral cataracts   TOTAL KNEE ARTHROPLASTY Right 02/28/2013   TOTAL KNEE ARTHROPLASTY Right 02/27/2013   Procedure: TOTAL KNEE ARTHROPLASTY- knee;  Surgeon: Newt Minion, MD;  Location: [facility];  Service: Orthopedics;  Laterality: Right;  Right Total Knee Arthroplasty   TOTAL KNEE ARTHROPLASTY Left  07/04/2014   Procedure: LEFT TOTAL KNEE ARTHROPLASTY;  Surgeon: Newt Minion, MD;  Location: [facility];  Service: Orthopedics;  Laterality: Left;   Social History   Social History Narrative   Not on file   Immunization History  Administered Date(s) Administered   Influenza-Unspecified 09/03/2012   Pneumococcal Polysaccharide-23 03/01/2013     Objective: Vital Signs: BP 138/76 (BP Location: Left Arm, Patient Position: Sitting, Cuff Size: Normal)    Pulse 74    Resp 14    Ht 5' 2" (1.575 m)    Wt 212 lb (96.2 kg)    LMP  (LMP Unknown)    BMI 38.78 kg/m    Physical Exam Vitals signs and nursing note reviewed.  Constitutional:      Appearance: She is well-developed.  HENT:     Head: Normocephalic and atraumatic.  Eyes:     Conjunctiva/sclera: Conjunctivae normal.  Neck:     Musculoskeletal: Normal range of motion.  Cardiovascular:     Rate and Rhythm: Normal rate and regular rhythm.     Heart sounds: Normal heart sounds.  Pulmonary:     Effort: Pulmonary effort is normal.     Breath sounds: Normal breath sounds.  Abdominal:     General: Bowel sounds are normal.     Palpations: Abdomen is soft.  Lymphadenopathy:     Cervical: No cervical adenopathy.  Skin:    General: Skin is warm and dry.     Capillary Refill: Capillary refill takes less than 2 seconds.  Neurological:     Mental Status: She is alert and oriented to person, place, and time.  Psychiatric:        Behavior: Behavior normal.      Musculoskeletal Exam: C-spine limited ROM. Mild thoracic kyphosis. Limited ROM of lumbar spine with discomfort.  No midline spinal tenderness in lumbar region.  Shoulder joints, elbow joints, wrist joints, MCPs, PIPs, and DIPs good ROM with no synovitis.  Synovial thickening of MCP joints.  Ulnar deviation noted.  Hyperextension of PIP joints.  Hip joints good ROM.  Bilateral knee joint replacements good ROM with warmth bilaterally.  Right tibiotalar subluxation, warmth, and tenderness.   Left ankle has no tenderness or inflammation.   CDAI Exam: CDAI Score: 0.8  Patient Global: 4 mm; Provider Global: 4 mm Swollen: 0 ; Tender: 0  Joint Exam   No joint exam has been documented for this visit   There is currently no information documented on the homunculus. Go to the Rheumatology activity and complete the homunculus joint exam.  Investigation: No additional findings.  Imaging: No results found.  Recent Labs: Lab Results  Component Value Date   WBC 6.6 06/11/2018   HGB 12.3 06/11/2018   PLT 169 06/11/2018   NA 135 06/11/2018   K 4.3 06/11/2018   CL 101 06/11/2018   CO2 25 06/11/2018   GLUCOSE 408 (H) 06/11/2018   BUN 17 06/11/2018   CREATININE 0.73 06/11/2018   BILITOT 0.4 06/11/2018   ALKPHOS 102 06/20/2016   AST 20 06/11/2018   ALT 18 06/11/2018  PROT 6.8 06/11/2018   ALBUMIN 3.7 06/20/2016   CALCIUM 9.2 06/11/2018   GFRAA 102 06/11/2018   QFTBGOLDPLUS NEGATIVE 02/19/2018    Speciality Comments: No specialty comments available.  Procedures:  No procedures performed Allergies: Patient has no known allergies.   Assessment / Plan:     Visit Diagnoses: Rheumatoid arthritis of multiple sites with negative rheumatoid factor (HCC) - RF negative, CCP negative, elevated ESR, positive ANA, positive Ro -  She has right tibiotalar subluxation, warmth, and tenderness on exam.  She has no other joint pain or joint swelling at this time.  She has noticed significant improvement since restarting on Humira 40 mg sq every 14 days and Rasuvo 20 mg sq every week.  She has not had any recent rheumatoid arthritis flares.  She will continue on this current treatment regimen.  A prescription for folic acid 2 mg po daily was sent to the pharmacy.  She was advised to notify us if she would like to return for a right ankle joint cortisone injection.  She was advised to notify us if she develops increased joint pain or joint swelling.  She will follow up in 5 months.   High  risk medication use - Humira 40 mg every 14 days and Rasuvo 20 mg every 7 days.  Last TB gold negative on 02/19/2018 and will monitor yearly.  Most recent CBC/CMP stable except for elevated glucose on 06/11/2018.  Due for CBC/CMP in September and will monitor every 3 months.  Standing orders are in place.  She has received the Pneumovax 23 vaccine. She was advised to hold Humira and Rasuvo if she develops signs or symptoms of an infection and to resume once the infection has cleared. The importance of social distancing and following the standard precautions recommended by the CDC were discussed.    History of total knee replacement, bilateral -Doing well.  She has good ROM.  Warmth noted bilaterally.  No joint effusion.   DDD (degenerative disc disease), lumbar -She has chronic lower back pain.  She sees Dr. Sherwood Gambler on a regular basis.  She is not ready to proceed with surgery at this time.   Other medical conditions listed as follows:   History of vitamin D deficiency   History of diabetic neuropathy  History of diabetes mellitus   Dyslipidemia   History of hypertension   Orders: No orders of the defined types were placed in this encounter.  Meds ordered this encounter  Medications   folic acid (FOLVITE) 1 MG tablet    Sig: Take 2 tablets (2 mg total) by mouth daily.    Dispense:  180 tablet    Refill:  3    Follow-Up Instructions: Return in about 5 months (around 12/16/2018) for Rheumatoid arthritis, DDD.   Ofilia Neas, PA-C  Note - This record has been created using Dragon software.  Chart creation errors have been sought, but may not always  have been located. Such creation errors do not reflect on  the standard of medical care.

## 2018-07-10 ENCOUNTER — Other Ambulatory Visit: Payer: Self-pay | Admitting: *Deleted

## 2018-07-10 DIAGNOSIS — M0609 Rheumatoid arthritis without rheumatoid factor, multiple sites: Secondary | ICD-10-CM

## 2018-07-10 MED ORDER — RASUVO 20 MG/0.4ML ~~LOC~~ SOAJ: 20.0000 mg | SUBCUTANEOUS | 0 refills | Status: DC

## 2018-07-10 MED ORDER — HUMIRA (2 PEN) 40 MG/0.4ML ~~LOC~~ AJKT: 40.0000 mg | AUTO-INJECTOR | SUBCUTANEOUS | 0 refills | Status: DC

## 2018-07-10 NOTE — Telephone Encounter (Signed)
Refill request received via fax  Last Visit: 02/15/18 Next Visit: 07/16/18 Labs: 06/11/18 Glucose is very elevated-408. Rest of CMP WNL. CBC stable TB Gold: 02/19/18 Neg   Okay to refill per Dr. Estanislado Pandy

## 2018-07-16 ENCOUNTER — Ambulatory Visit (INDEPENDENT_AMBULATORY_CARE_PROVIDER_SITE_OTHER): Payer: BC Managed Care – PPO | Admitting: Physician Assistant

## 2018-07-16 ENCOUNTER — Encounter: Payer: Self-pay | Admitting: Physician Assistant

## 2018-07-16 ENCOUNTER — Other Ambulatory Visit: Payer: Self-pay

## 2018-07-16 VITALS — BP 138/76 | HR 74 | Resp 14 | Ht 62.0 in | Wt 212.0 lb

## 2018-07-16 DIAGNOSIS — M5136 Other intervertebral disc degeneration, lumbar region: Secondary | ICD-10-CM

## 2018-07-16 DIAGNOSIS — M0609 Rheumatoid arthritis without rheumatoid factor, multiple sites: Secondary | ICD-10-CM

## 2018-07-16 DIAGNOSIS — Z96653 Presence of artificial knee joint, bilateral: Secondary | ICD-10-CM | POA: Diagnosis not present

## 2018-07-16 DIAGNOSIS — Z79899 Other long term (current) drug therapy: Secondary | ICD-10-CM

## 2018-07-16 DIAGNOSIS — Z8639 Personal history of other endocrine, nutritional and metabolic disease: Secondary | ICD-10-CM

## 2018-07-16 DIAGNOSIS — Z8679 Personal history of other diseases of the circulatory system: Secondary | ICD-10-CM

## 2018-07-16 DIAGNOSIS — E785 Hyperlipidemia, unspecified: Secondary | ICD-10-CM

## 2018-07-16 MED ORDER — FOLIC ACID 1 MG PO TABS: 2.0000 mg | ORAL_TABLET | Freq: Every day | ORAL | 3 refills | Status: DC

## 2018-07-16 NOTE — Patient Instructions (Addendum)
Standing Labs We placed an order today for your standing lab work.    Please come back and get your standing labs in September and every 3 months  We have open lab daily Monday through Thursday from 8:30-12:30 PM and 1:30-4:30 PM and Friday from 8:30-12:30 PM and 1:30 -4:00 PM at the office of Dr. Shaili Deveshwar.   You may experience shorter wait times on Monday and Friday afternoons. The office is located at [address], [address], [place], [place] [address] No appointment is necessary.   Labs are drawn by [facility].  You may receive a bill from [facility] for your lab work.  If you wish to have your labs drawn at another location, please call the office 24 hours in advance to send orders.  If you have any questions regarding directions or hours of operation,  please call [phone].   Just as a reminder please drink plenty of water prior to coming for your lab work. Thanks!  

## 2018-10-02 ENCOUNTER — Other Ambulatory Visit: Payer: Self-pay | Admitting: Rheumatology

## 2018-10-02 DIAGNOSIS — M0609 Rheumatoid arthritis without rheumatoid factor, multiple sites: Secondary | ICD-10-CM

## 2018-10-02 NOTE — Telephone Encounter (Signed)
Last Visit: 07/16/18 Next Visit: 01/23/18 Labs: 06/11/18 Glucose is very elevated. Rest of CMP WNL. CBC stable.  Patient advised she is due to update labs. Patient will update this week  Okay to refill 30 day supply per Dr. Estanislado Pandy

## 2018-10-03 ENCOUNTER — Other Ambulatory Visit: Payer: Self-pay

## 2018-10-03 DIAGNOSIS — Z79899 Other long term (current) drug therapy: Secondary | ICD-10-CM | POA: Diagnosis not present

## 2018-10-04 NOTE — Progress Notes (Signed)
Glucose is elevated.  Please notify patient and forward labs to her PCP.

## 2018-10-05 LAB — CBC WITH DIFFERENTIAL/PLATELET
Absolute Monocytes: 365 cells/uL (ref 200–950)
Basophils Absolute: 44 cells/uL (ref 0–200)
Basophils Relative: 0.6 %
Eosinophils Absolute: 569 cells/uL — ABNORMAL HIGH (ref 15–500)
Eosinophils Relative: 7.8 %
HCT: 38.9 % (ref 35.0–45.0)
Hemoglobin: 12.7 g/dL (ref 11.7–15.5)
Lymphs Abs: 1599 cells/uL (ref 850–3900)
MCH: 30.8 pg (ref 27.0–33.0)
MCHC: 32.6 g/dL (ref 32.0–36.0)
MCV: 94.4 fL (ref 80.0–100.0)
MPV: 11.5 fL (ref 7.5–12.5)
Monocytes Relative: 5 %
Neutro Abs: 4723 cells/uL (ref 1500–7800)
Neutrophils Relative %: 64.7 %
Platelets: 198 10*3/uL (ref 140–400)
RBC: 4.12 10*6/uL (ref 3.80–5.10)
RDW: 12.9 % (ref 11.0–15.0)
Total Lymphocyte: 21.9 %
WBC: 7.3 10*3/uL (ref 3.8–10.8)

## 2018-10-05 LAB — PROTEIN ELECTROPHORESIS, SERUM, WITH REFLEX
Albumin ELP: 3.8 g/dL (ref 3.8–4.8)
Alpha 1: 0.3 g/dL (ref 0.2–0.3)
Alpha 2: 0.8 g/dL (ref 0.5–0.9)
Beta 2: 0.4 g/dL (ref 0.2–0.5)
Beta Globulin: 0.5 g/dL (ref 0.4–0.6)
Gamma Globulin: 1.4 g/dL (ref 0.8–1.7)
Total Protein: 7.2 g/dL (ref 6.1–8.1)

## 2018-10-05 LAB — COMPLETE METABOLIC PANEL WITH GFR
AG Ratio: 1.1 (calc) (ref 1.0–2.5)
ALT: 17 U/L (ref 6–29)
AST: 17 U/L (ref 10–35)
Albumin: 3.9 g/dL (ref 3.6–5.1)
Alkaline phosphatase (APISO): 91 U/L (ref 37–153)
BUN: 14 mg/dL (ref 7–25)
CO2: 29 mmol/L (ref 20–32)
Calcium: 9.3 mg/dL (ref 8.6–10.4)
Chloride: 98 mmol/L (ref 98–110)
Creat: 0.76 mg/dL (ref 0.50–0.99)
GFR, Est African American: 97 mL/min/{1.73_m2} (ref >=60)
GFR, Est Non African American: 83 mL/min/{1.73_m2} (ref >=60)
Globulin: 3.4 g/dL (calc) (ref 1.9–3.7)
Glucose, Bld: 469 mg/dL — ABNORMAL HIGH (ref 65–99)
Potassium: 4.6 mmol/L (ref 3.5–5.3)
Sodium: 133 mmol/L — ABNORMAL LOW (ref 135–146)
Total Bilirubin: 0.4 mg/dL (ref 0.2–1.2)
Total Protein: 7.3 g/dL (ref 6.1–8.1)

## 2018-10-05 LAB — IGG, IGA, IGM
IgG (Immunoglobin G), Serum: 1457 mg/dL (ref 600–1540)
IgM, Serum: 263 mg/dL (ref 50–300)
Immunoglobulin A: 319 mg/dL (ref 70–320)

## 2018-10-05 LAB — IFE INTERPRETATION: Immunofix Electr Int: NOT DETECTED

## 2018-10-05 NOTE — Progress Notes (Signed)
Immunoglobulins WNL. IFE pending.

## 2018-10-08 NOTE — Progress Notes (Signed)
IFE did not reveal any monoclonal proteins.

## 2018-10-21 ENCOUNTER — Other Ambulatory Visit: Payer: Self-pay | Admitting: Rheumatology

## 2018-10-21 DIAGNOSIS — M0609 Rheumatoid arthritis without rheumatoid factor, multiple sites: Secondary | ICD-10-CM

## 2018-10-22 NOTE — Telephone Encounter (Signed)
Last Visit: 07/16/18 Next Visit: 01/23/18 Labs: 10/03/18  Glucose is elevated TB Gold: 02/19/18 Neg   Okay to refill per Dr. Estanislado Pandy

## 2018-10-29 DIAGNOSIS — Z23 Encounter for immunization: Secondary | ICD-10-CM | POA: Diagnosis not present

## 2018-10-29 DIAGNOSIS — Z803 Family history of malignant neoplasm of breast: Secondary | ICD-10-CM | POA: Diagnosis not present

## 2018-10-29 DIAGNOSIS — Z1231 Encounter for screening mammogram for malignant neoplasm of breast: Secondary | ICD-10-CM | POA: Diagnosis not present

## 2018-10-31 ENCOUNTER — Other Ambulatory Visit: Payer: Self-pay | Admitting: Rheumatology

## 2018-10-31 DIAGNOSIS — M0609 Rheumatoid arthritis without rheumatoid factor, multiple sites: Secondary | ICD-10-CM

## 2018-10-31 NOTE — Telephone Encounter (Signed)
Last Visit:07/16/18 Next Visit:01/23/18 Labs: 10/03/18  Glucose is elevated  Okay to refill per Dr. Estanislado Pandy

## 2019-01-15 ENCOUNTER — Ambulatory Visit: Payer: Medicare Other | Admitting: Rheumatology

## 2019-01-21 NOTE — Progress Notes (Signed)
Virtual Visit via Telephone Note  I connected with Connie Ross on 01/28/19 at  8:45 AM EST by telephone and verified that I am speaking with the correct person using two identifiers.  Location: Patient: Home Provider: Clinic  This service was conducted via virtual visit.  The patient was located at home. I was located in my office.  Consent was obtained prior to the virtual visit and is aware of possible charges through their insurance for this visit.  The patient is an established patient.  Dr. Estanislado Pandy, MD conducted the virtual visit and Hazel Sams, PA-C acted as scribe during the service.  Office staff helped with scheduling follow up visits after the service was conducted.     Her daughter Connie Ross acted as a Careers information officer.   I discussed the limitations, risks, security and privacy concerns of performing an evaluation and management service by telephone and the availability of in person appointments. I also discussed with the patient that there may be a patient responsible charge related to this service. The patient expressed understanding and agreed to proceed.  CC: Medication monitoring  History of Present Illness: Connie Ross is a 64 y.o. female with history of seronegative rheumatoid arthritis and DDD.  She is on Humira 40 mg sq injections every 14 days, Rasuvo 20 mg sq injections every week, and folic acid 2 mg po daily. She has not missed any doses recently. She denies any joint pain or joint swelling currently.  She denies any discomfort related to bilateral knee replacements.  She has chronic lower back pain and stiffness.    Review of Systems  Constitutional: Negative for fever and malaise/fatigue.  Eyes: Negative for photophobia, pain, discharge and redness.  Respiratory: Negative for cough, shortness of breath and wheezing.   Cardiovascular: Negative for chest pain and palpitations.  Gastrointestinal: Negative for blood in stool,  constipation and diarrhea.  Genitourinary: Negative for dysuria.  Musculoskeletal: Positive for back pain. Negative for joint pain, myalgias and neck pain.  Skin: Negative for rash.  Neurological: Negative for dizziness and headaches.  Psychiatric/Behavioral: Negative for depression. The patient is not nervous/anxious and does not have insomnia.      Observations/Objective:  Physical Exam  Constitutional: She is oriented to person, place, and time.  Neurological: She is alert and oriented to person, place, and time.  Psychiatric: Mood, memory, affect and judgment normal.     Patient reports morning stiffness for 1 hour.   Patient reports nocturnal pain.  Difficulty dressing/grooming: Denies Difficulty climbing stairs: Reports Difficulty getting out of chair: Reports Difficulty using hands for taps, buttons, cutlery, and/or writing: Denies   She denies any new medications or new allergies.    Assessment and Plan: Visit Diagnoses: Rheumatoid arthritis of multiple sites with negative rheumatoid factor (HCC) - RF negative, CCP negative, elevated ESR, positive ANA, positive Ro: She has not had any recent rheumatoid arthritis flares.  She is clinically doing well on Humira 40 mg sq injections every 14 days, Rasuvo 20 mg sq injections every 7 days, and folic acid 2 mg po daily.  She has chronic pain and inflammation in the right ankle joint. She experiences morning stiffness and nocturnal pain in the right ankle joint. She has subluxation of the right ankle joint and uses a cane to assist with ambulation. She has no other joint pain or joint swelling at this time.  She will continue on the current treatment regimen.  She was advised to notify us if she develops increased joint pain  or joint swelling.  She will follow up in 3 months.   High risk medication use - Humira 40 mg every 14 days and Rasuvo 20 mg every 7 days.  Last TB gold negative on 02/19/2018 and will monitor yearly.  CBC and CMP  were drawn on 10/03/18.  She is overdue to update lab work. Standing orders will be placed and a future order for TB gold will also be placed.    History of total knee replacement, bilateral: Doing well. She has no discomfort at this time. She uses a cane to assist with ambulation.   DDD (degenerative disc disease), lumbar -She has chronic lower back pain.  She sees Dr. Sherwood Gambler on a regular basis.    Other medical conditions listed as follows:   History of vitamin D deficiency   History of diabetic neuropathy  History of diabetes mellitus   Dyslipidemia   History of hypertension   Follow Up Instructions: She will follow up in 3 months.    I discussed the assessment and treatment plan with the patient. The patient was provided an opportunity to ask questions and all were answered. The patient agreed with the plan and demonstrated an understanding of the instructions.   The patient was advised to call back or seek an in-person evaluation if the symptoms worsen or if the condition fails to improve as anticipated.  I provided 15 minutes of non-face-to-face time during this encounter.  Bo Merino, MD   Scribed by-  Hazel Sams, PA-C

## 2019-01-28 ENCOUNTER — Other Ambulatory Visit: Payer: Self-pay

## 2019-01-28 ENCOUNTER — Telehealth (INDEPENDENT_AMBULATORY_CARE_PROVIDER_SITE_OTHER): Payer: BC Managed Care – PPO | Admitting: Rheumatology

## 2019-01-28 ENCOUNTER — Encounter: Payer: Self-pay | Admitting: Rheumatology

## 2019-01-28 DIAGNOSIS — Z79899 Other long term (current) drug therapy: Secondary | ICD-10-CM | POA: Diagnosis not present

## 2019-01-28 DIAGNOSIS — Z8639 Personal history of other endocrine, nutritional and metabolic disease: Secondary | ICD-10-CM

## 2019-01-28 DIAGNOSIS — Z8679 Personal history of other diseases of the circulatory system: Secondary | ICD-10-CM

## 2019-01-28 DIAGNOSIS — M5136 Other intervertebral disc degeneration, lumbar region: Secondary | ICD-10-CM | POA: Diagnosis not present

## 2019-01-28 DIAGNOSIS — E785 Hyperlipidemia, unspecified: Secondary | ICD-10-CM

## 2019-01-28 DIAGNOSIS — Z96653 Presence of artificial knee joint, bilateral: Secondary | ICD-10-CM | POA: Diagnosis not present

## 2019-01-28 DIAGNOSIS — M0609 Rheumatoid arthritis without rheumatoid factor, multiple sites: Secondary | ICD-10-CM | POA: Diagnosis not present

## 2019-01-29 DIAGNOSIS — Z9225 Personal history of immunosupression therapy: Secondary | ICD-10-CM | POA: Diagnosis not present

## 2019-01-29 DIAGNOSIS — Z79899 Other long term (current) drug therapy: Secondary | ICD-10-CM | POA: Diagnosis not present

## 2019-01-30 ENCOUNTER — Telehealth: Payer: Self-pay | Admitting: Rheumatology

## 2019-01-30 NOTE — Telephone Encounter (Signed)
January 30, 2019 8:05 AM late entry I received to text messages or phone call from [facility] regarding Ms. Connie Ross's blood glucose level of 502.  I tried calling patient there was no answer left message on the answering answering machine for her to call back.  We also tried calling her this morning at 8:00 and left message again for her to return call.  We will wait for the next few hours for her response. Pollyann Savoy, MD

## 2019-01-30 NOTE — Progress Notes (Signed)
Glucose is extremely elevated-502.  Rest of CMP WNL.  CBC WNL.   [facility] contacted Dr. Corliss Skains last night to alert her.  Dr. Corliss Skains and office staff have tried to reach out to the patient several times.  We will continue to try to reach the patient.

## 2019-01-31 LAB — CBC WITH DIFFERENTIAL/PLATELET
Absolute Monocytes: 390 cells/uL (ref 200–950)
Basophils Absolute: 42 cells/uL (ref 0–200)
Basophils Relative: 0.7 %
Eosinophils Absolute: 348 cells/uL (ref 15–500)
Eosinophils Relative: 5.8 %
HCT: 39 % (ref 35.0–45.0)
Hemoglobin: 12.8 g/dL (ref 11.7–15.5)
Lymphs Abs: 1452 cells/uL (ref 850–3900)
MCH: 30.8 pg (ref 27.0–33.0)
MCHC: 32.8 g/dL (ref 32.0–36.0)
MCV: 94 fL (ref 80.0–100.0)
MPV: 11.8 fL (ref 7.5–12.5)
Monocytes Relative: 6.5 %
Neutro Abs: 3768 cells/uL (ref 1500–7800)
Neutrophils Relative %: 62.8 %
Platelets: 186 10*3/uL (ref 140–400)
RBC: 4.15 10*6/uL (ref 3.80–5.10)
RDW: 12.2 % (ref 11.0–15.0)
Total Lymphocyte: 24.2 %
WBC: 6 10*3/uL (ref 3.8–10.8)

## 2019-01-31 LAB — QUANTIFERON-TB GOLD PLUS
Mitogen-NIL: >10 IU/mL
NIL: 0.07 IU/mL
QuantiFERON-TB Gold Plus: NEGATIVE
TB1-NIL: 0 IU/mL
TB2-NIL: 0 IU/mL

## 2019-01-31 LAB — COMPLETE METABOLIC PANEL WITH GFR
AG Ratio: 1.2 (calc) (ref 1.0–2.5)
ALT: 13 U/L (ref 6–29)
AST: 14 U/L (ref 10–35)
Albumin: 3.9 g/dL (ref 3.6–5.1)
Alkaline phosphatase (APISO): 96 U/L (ref 37–153)
BUN: 18 mg/dL (ref 7–25)
CO2: 30 mmol/L (ref 20–32)
Calcium: 9.7 mg/dL (ref 8.6–10.4)
Chloride: 97 mmol/L — ABNORMAL LOW (ref 98–110)
Creat: 0.95 mg/dL (ref 0.50–0.99)
GFR, Est African American: 74 mL/min/{1.73_m2} (ref >=60)
GFR, Est Non African American: 64 mL/min/{1.73_m2} (ref >=60)
Globulin: 3.2 g/dL (calc) (ref 1.9–3.7)
Glucose, Bld: 502 mg/dL (ref 65–139)
Potassium: 4.7 mmol/L (ref 3.5–5.3)
Sodium: 136 mmol/L (ref 135–146)
Total Bilirubin: 0.4 mg/dL (ref 0.2–1.2)
Total Protein: 7.1 g/dL (ref 6.1–8.1)

## 2019-02-01 NOTE — Progress Notes (Signed)
TB gold negative

## 2019-02-07 ENCOUNTER — Telehealth: Payer: Self-pay

## 2019-02-07 DIAGNOSIS — M0609 Rheumatoid arthritis without rheumatoid factor, multiple sites: Secondary | ICD-10-CM

## 2019-02-07 MED ORDER — HUMIRA (2 PEN) 40 MG/0.4ML ~~LOC~~ AJKT: 40.0000 mg | AUTO-INJECTOR | SUBCUTANEOUS | 0 refills | Status: DC

## 2019-02-07 NOTE — Telephone Encounter (Signed)
Refill request received via fax from [facility] for humira.   Last Visit: 01/28/2019 telemedicine  Next Visit: 05/02/2019 Labs: 01/29/2019 Glucose is extremely elevated-502. Rest of CMP WNL. CBC WNL.  TB Gold: 01/29/2019 negative   Okay to refill per Dr. Corliss Skains.

## 2019-03-13 DIAGNOSIS — I10 Essential (primary) hypertension: Secondary | ICD-10-CM | POA: Diagnosis not present

## 2019-03-13 DIAGNOSIS — E1169 Type 2 diabetes mellitus with other specified complication: Secondary | ICD-10-CM | POA: Diagnosis not present

## 2019-03-13 DIAGNOSIS — Z7189 Other specified counseling: Secondary | ICD-10-CM | POA: Diagnosis not present

## 2019-03-13 DIAGNOSIS — M069 Rheumatoid arthritis, unspecified: Secondary | ICD-10-CM | POA: Diagnosis not present

## 2019-04-04 ENCOUNTER — Other Ambulatory Visit: Payer: Self-pay | Admitting: *Deleted

## 2019-04-04 DIAGNOSIS — M0609 Rheumatoid arthritis without rheumatoid factor, multiple sites: Secondary | ICD-10-CM

## 2019-04-04 MED ORDER — RASUVO 20 MG/0.4ML ~~LOC~~ SOAJ: SUBCUTANEOUS | 0 refills | Status: DC

## 2019-04-04 NOTE — Telephone Encounter (Signed)
Refill request received via fax  Last Visit: 01/28/2019 telemedicine  Next Visit: 05/02/2019 Labs: 01/29/2019 Glucose is extremely elevated-502. Rest of CMP WNL. CBC WNL.  TB Gold: 01/29/2019 negative  Current Dose per office note on 01/28/19  Rasuvo 20 mg sq injections every 7 days  Okay to refill per Dr. Corliss Skains.

## 2019-04-17 ENCOUNTER — Telehealth: Payer: Self-pay | Admitting: Rheumatology

## 2019-04-17 NOTE — Telephone Encounter (Signed)
Returned call, left message for [name] to call back.

## 2019-04-17 NOTE — Telephone Encounter (Signed)
Veronica left a voicemail requesting a return call to discuss new co-pay cards for her mom's prescriptions of Humira and Rasuvo.

## 2019-04-18 NOTE — Telephone Encounter (Signed)
Spoke to daughter [name], provided her with Humira support number- [phone] and advised of [email or web address] to enroll into a new Rasuvo copay card. She will call back if she has any issues.

## 2019-04-30 NOTE — Progress Notes (Signed)
Office Visit Note  Patient: Connie Ross             Date of Birth: 03-Feb-1955           MRN: [id number]             PCP: Iona Beard, MD Referring: Iona Beard, MD Visit Date: 05/02/2019 Occupation: @GUAROCC @  Subjective:  Chronic lower back pain   History of Present Illness: Connie Ross is a 64 y.o. female with history of seronegative rheumatoid arthritis and DDD. She is on Humira 40 mg sq injections once weekly, Rasuvo 20 mg sq injections once weekly, and folic acid 2 mg po daily. She denies missing any doses recently.  She denies any recent rheumatoid arthritis flares.  She continues to have chronic lower back pain.  She is followed by Dr. Ileene Rubens.  According to the patient Dr. Ileene Rubens did not want to proceed with surgery until her right ankle joint was operated on.  She is not a good surgical candidate for ankle joint surgery.  She has not followed back up with Dr. Ileene Rubens yet.  He will retiring soon.  She is currently having right sided radiculopathy.  She is walking with a cane to assist with ambulation.  She is taking hydrocodone as needed for pain relief.       Activities of Daily Living:  Patient reports morning stiffness for 4 hours.   Patient Reports nocturnal pain.  Difficulty dressing/grooming: Denies Difficulty climbing stairs: Reports Difficulty getting out of chair: Reports Difficulty using hands for taps, buttons, cutlery, and/or writing: Denies  Review of Systems  Constitutional: Negative for fatigue.  HENT: Positive for mouth dryness. Negative for mouth sores and nose dryness.   Eyes: Negative for pain, visual disturbance and dryness.  Respiratory: Negative for cough, hemoptysis, shortness of breath and difficulty breathing.   Cardiovascular: Positive for swelling in legs/feet. Negative for chest pain, palpitations and hypertension.  Gastrointestinal: Negative for blood in stool, constipation and diarrhea.  Endocrine: Negative for  increased urination.  Genitourinary: Negative for difficulty urinating and painful urination.  Musculoskeletal: Positive for arthralgias, joint pain, joint swelling and morning stiffness. Negative for myalgias, muscle weakness, muscle tenderness and myalgias.  Skin: Negative for color change, pallor, rash, hair loss, nodules/bumps, skin tightness, ulcers and sensitivity to sunlight.  Allergic/Immunologic: Negative for susceptible to infections.  Neurological: Negative for dizziness, numbness, headaches and weakness.  Hematological: Negative for bruising/bleeding tendency and swollen glands.  Psychiatric/Behavioral: Positive for sleep disturbance. Negative for depressed mood. The patient is not nervous/anxious.     PMFS History:  Patient Active Problem List   Diagnosis Date Noted  . Pain in right foot 04/06/2016  . Rheumatoid arthritis of multiple sites with negative rheumatoid factor ([place]) 02/03/2016  . High risk medication use 02/03/2016  . 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 Essential hypertension 02/03/2016  . History of diabetes mellitus 02/03/2016  . History of diabetic neuropathy 02/03/2016  . Dyslipidemia 02/03/2016  . Vitamin D deficiency 02/03/2016  . Abnormality of gait 03/15/2013  . Right knee pain 03/15/2013  . History of total knee replacement, bilateral 02/27/2013    Past Medical History:  Diagnosis Date  . Anxiety    takes no meds  . DVT (deep venous thrombosis) ([place])   . GERD (gastroesophageal reflux disease)   . High cholesterol   . Hypertension   . Rheumatoid arthritis ([place])   . Type II diabetes mellitus ([place])     Family History  Adopted: Yes  Problem  Relation Age of Onset  . Cancer Mother        breast  . Healthy Son   . Healthy Daughter   . Healthy Daughter    Past Surgical History:  Procedure Laterality Date  . EYE SURGERY     bilateral cataracts  . KNEE ARTHROPLASTY    . TOTAL KNEE ARTHROPLASTY Right 02/28/2013  . TOTAL KNEE  ARTHROPLASTY Right 02/27/2013   Procedure: TOTAL KNEE ARTHROPLASTY- knee;  Surgeon: Newt Minion, MD;  Location: [facility];  Service: Orthopedics;  Laterality: Right;  Right Total Knee Arthroplasty  . TOTAL KNEE ARTHROPLASTY Left 07/04/2014   Procedure: LEFT TOTAL KNEE ARTHROPLASTY;  Surgeon: Newt Minion, MD;  Location: [facility];  Service: Orthopedics;  Laterality: Left;   Social History   Social History Narrative  . Not on file   Immunization History  Administered Date(s) Administered  . Influenza-Unspecified 09/03/2012  . Pneumococcal Polysaccharide-23 03/01/2013     Objective: Vital Signs: BP 128/63 (BP Location: Left Arm, Patient Position: Sitting, Cuff Size: Normal)   Pulse 70   Resp 18   Ht 5' 2"  (1.575 m)   Wt 215 lb 3.2 oz (97.6 kg)   LMP  (LMP Unknown)   BMI 39.36 kg/m    Physical Exam Vitals and nursing note reviewed.  Constitutional:      Appearance: She is well-developed.  HENT:     Head: Normocephalic and atraumatic.  Eyes:     Conjunctiva/sclera: Conjunctivae normal.  Pulmonary:     Effort: Pulmonary effort is normal.  Abdominal:     General: Bowel sounds are normal.     Palpations: Abdomen is soft.  Musculoskeletal:     Cervical back: Normal range of motion.  Lymphadenopathy:     Cervical: No cervical adenopathy.  Skin:    General: Skin is warm and dry.     Capillary Refill: Capillary refill takes less than 2 seconds.  Neurological:     Mental Status: She is alert and oriented to person, place, and time.  Psychiatric:        Behavior: Behavior normal.      Musculoskeletal Exam: C-spine, thoracic spine, and lumbar spine limited and painful ROM.  thoracic kyphosis noted.  Shoulder joints, elbow joints, wrist joints, MCPs, PIPs, and DIPs good ROM with no synovitis.  Synovial thickening of MCP joints but no synovitis noted. Hyperextension of PIP joints. Ulnar deviation bilaterally.  Bilateral knee replacements have good ROM with no warmth or effusion.  Tenderness and swelling of the right ankle joint.  She has tibiotalar subluxation of the right ankle joint.  Left ankle has good ROM with no tenderness or inflammation.   CDAI Exam: CDAI Score: -- Patient Global: --; Provider Global: -- Swollen: --; Tender: -- Joint Exam 05/02/2019   No joint exam has been documented for this visit   There is currently no information documented on the homunculus. Go to the Rheumatology activity and complete the homunculus joint exam.  Investigation: No additional findings.  Imaging: No results found.  Recent Labs: Lab Results  Component Value Date   WBC 6.0 01/29/2019   HGB 12.8 01/29/2019   PLT 186 01/29/2019   NA 136 01/29/2019   K 4.7 01/29/2019   CL 97 (L) 01/29/2019   CO2 30 01/29/2019   GLUCOSE 502 (HH) 01/29/2019   BUN 18 01/29/2019   CREATININE 0.95 01/29/2019   BILITOT 0.4 01/29/2019   ALKPHOS 102 06/20/2016   AST 14 01/29/2019   ALT 13 01/29/2019  PROT 7.1 01/29/2019   ALBUMIN 3.7 06/20/2016   CALCIUM 9.7 01/29/2019   GFRAA 74 01/29/2019   QFTBGOLDPLUS NEGATIVE 01/29/2019    Speciality Comments: No specialty comments available.  Procedures:  No procedures performed Allergies: Patient has no known allergies.   Assessment / Plan:     Visit Diagnoses: Rheumatoid arthritis of multiple sites with negative rheumatoid factor (HCC) - RF negative, CCP negative, elevated ESR, positive ANA, positive Ro: She has no synovitis on exam.  She has synovial thickening of MCP joints but no tenderness or inflammation was noted.  She has not had any recent rheumatoid arthritis flares.  She is clinically doing well on Humira 40 mg subcutaneous injections every 14 days, Rasuvo 20 mg a days injections once weekly, and folic acid 2 mg by mouth daily.  She has not missed any doses of Humira or Rasuvo recently.  She has chronic pain and inflammation in the right ankle joint.  The right tibiotalar joint is subluxed.  According to the patient she was  evaluated by Dr. Doran Durand to discuss surgery but is not a good surgical candidate.  She is not experiencing any other joint pain or inflammation at this time.  She will continue on Humira and Rasuvo as prescribed.  She does not need any refills at this time.  She was advised to notify us if she develops increased joint pain or joint swelling.  High risk medication use - Humira 40 mg every 14 days and Rasuvo 20 mg every 7 days.  CBC and CMP were drawn on 01/29/2019.  She is due to update lab work today.  She will return for lab work in July and every 3 months to monitor for drug toxicity.  Standing orders are in place.  TB gold was negative on 01/29/2019.- Plan: COMPLETE METABOLIC PANEL WITH GFR, CBC with Differential/Platelet  History of total knee replacement, bilateral: Doing well.  She has good range of motion with no discomfort at this time.  No warmth or effusion was noted.  She walks with a cane to assist with ambulation.  DDD (degenerative disc disease), lumbar: Chronic pain.  She is experiencing right-sided radiculopathy.  She has been followed by Dr. Sherwood Gambler but has not a good surgical candidate at this time.  She has been taking hydrocodone as needed for pain relief.  We discussed referral to pain management but she declined at this time.  Other medical conditions are listed as follows:  History of vitamin D deficiency  History of diabetic neuropathy  History of diabetes mellitus  Dyslipidemia  History of hypertension  Orders: Orders Placed This Encounter  Procedures  . COMPLETE METABOLIC PANEL WITH GFR  . CBC with Differential/Platelet   No orders of the defined types were placed in this encounter.    Follow-Up Instructions: Return in about 5 months (around 10/02/2019) for Rheumatoid arthritis, DDD.   Hazel Sams, PA-C  I examined and evaluated the patient with Hazel Sams PA. Patient had no synovitis on examination. Although she does have synovial thickening. Her symptoms  are very well controlled on combination of Humira and Otrexup. She continues to have a lot of lower back pain. She will follow up with neurosurgery. The plan of care was discussed as noted above.  Bo Merino, MD  Note - This record has been created using Editor, commissioning.  Chart creation errors have been sought, but may not always  have been located. Such creation errors do not reflect on  the standard of medical care.

## 2019-05-02 ENCOUNTER — Other Ambulatory Visit: Payer: Self-pay

## 2019-05-02 ENCOUNTER — Encounter: Payer: Self-pay | Admitting: Rheumatology

## 2019-05-02 ENCOUNTER — Ambulatory Visit (INDEPENDENT_AMBULATORY_CARE_PROVIDER_SITE_OTHER): Payer: BC Managed Care – PPO | Admitting: Rheumatology

## 2019-05-02 VITALS — BP 128/63 | HR 70 | Resp 18 | Ht 62.0 in | Wt 215.2 lb

## 2019-05-02 DIAGNOSIS — E785 Hyperlipidemia, unspecified: Secondary | ICD-10-CM

## 2019-05-02 DIAGNOSIS — Z8639 Personal history of other endocrine, nutritional and metabolic disease: Secondary | ICD-10-CM

## 2019-05-02 DIAGNOSIS — Z79899 Other long term (current) drug therapy: Secondary | ICD-10-CM

## 2019-05-02 DIAGNOSIS — M0609 Rheumatoid arthritis without rheumatoid factor, multiple sites: Secondary | ICD-10-CM | POA: Diagnosis not present

## 2019-05-02 DIAGNOSIS — Z8679 Personal history of other diseases of the circulatory system: Secondary | ICD-10-CM

## 2019-05-02 DIAGNOSIS — Z96653 Presence of artificial knee joint, bilateral: Secondary | ICD-10-CM | POA: Diagnosis not present

## 2019-05-02 DIAGNOSIS — M5136 Other intervertebral disc degeneration, lumbar region: Secondary | ICD-10-CM | POA: Diagnosis not present

## 2019-05-02 LAB — COMPLETE METABOLIC PANEL WITH GFR
AG Ratio: 1.3 (calc) (ref 1.0–2.5)
ALT: 17 U/L (ref 6–29)
AST: 21 U/L (ref 10–35)
Albumin: 3.8 g/dL (ref 3.6–5.1)
Alkaline phosphatase (APISO): 76 U/L (ref 37–153)
BUN: 17 mg/dL (ref 7–25)
CO2: 28 mmol/L (ref 20–32)
Calcium: 9.2 mg/dL (ref 8.6–10.4)
Chloride: 105 mmol/L (ref 98–110)
Creat: 0.66 mg/dL (ref 0.50–0.99)
GFR, Est African American: 108 mL/min/{1.73_m2} (ref >=60)
GFR, Est Non African American: 93 mL/min/{1.73_m2} (ref >=60)
Globulin: 3 g/dL (calc) (ref 1.9–3.7)
Glucose, Bld: 114 mg/dL — ABNORMAL HIGH (ref 65–99)
Potassium: 4.2 mmol/L (ref 3.5–5.3)
Sodium: 141 mmol/L (ref 135–146)
Total Bilirubin: 0.4 mg/dL (ref 0.2–1.2)
Total Protein: 6.8 g/dL (ref 6.1–8.1)

## 2019-05-02 LAB — CBC WITH DIFFERENTIAL/PLATELET
Absolute Monocytes: 394 cells/uL (ref 200–950)
Basophils Absolute: 44 cells/uL (ref 0–200)
Basophils Relative: 0.6 %
Eosinophils Absolute: 511 cells/uL — ABNORMAL HIGH (ref 15–500)
Eosinophils Relative: 7 %
HCT: 35.8 % (ref 35.0–45.0)
Hemoglobin: 11.8 g/dL (ref 11.7–15.5)
Lymphs Abs: 1745 cells/uL (ref 850–3900)
MCH: 30.8 pg (ref 27.0–33.0)
MCHC: 33 g/dL (ref 32.0–36.0)
MCV: 93.5 fL (ref 80.0–100.0)
MPV: 10.5 fL (ref 7.5–12.5)
Monocytes Relative: 5.4 %
Neutro Abs: 4606 cells/uL (ref 1500–7800)
Neutrophils Relative %: 63.1 %
Platelets: 225 10*3/uL (ref 140–400)
RBC: 3.83 10*6/uL (ref 3.80–5.10)
RDW: 13.3 % (ref 11.0–15.0)
Total Lymphocyte: 23.9 %
WBC: 7.3 10*3/uL (ref 3.8–10.8)

## 2019-05-02 NOTE — Patient Instructions (Signed)
Standing Labs We placed an order today for your standing lab work.    Please come back and get your standing labs in July and every 3 months  We have open lab daily Monday through Thursday from 8:30-12:30 PM and 1:30-4:30 PM and Friday from 8:30-12:30 PM and 1:30-4:00 PM at the office of Dr. Shaili Deveshwar.   You may experience shorter wait times on Monday and Friday afternoons. The office is located at [address], [address], [place], [place] [address] No appointment is necessary.   Labs are drawn by [facility].  You may receive a bill from [facility] for your lab work.  If you wish to have your labs drawn at another location, please call the office 24 hours in advance to send orders.  If you have any questions regarding directions or hours of operation,  please call [phone].   Just as a reminder please drink plenty of water prior to coming for your lab work. Thanks!  

## 2019-05-03 NOTE — Progress Notes (Signed)
Glucose is 114. Rest of CMP WNL.  Absolute eosinophils are borderline elevated but not concerning. The elevation is likely due to seasonal allergies. WBC count is WNL.  Rest of CBC WNL.

## 2019-06-18 ENCOUNTER — Other Ambulatory Visit: Payer: Self-pay | Admitting: Rheumatology

## 2019-06-18 DIAGNOSIS — M0609 Rheumatoid arthritis without rheumatoid factor, multiple sites: Secondary | ICD-10-CM

## 2019-06-18 NOTE — Telephone Encounter (Signed)
Last Visit: 05/02/2019 Next Visit: 09/26/2019 Labs: 05/02/2019 Glucose is 114. Rest of CMP WNL. Absolute eosinophils are borderline elevated. WBC count is WNL. Rest of CBC WNL. TB Gold: 01/29/2019 Neg   Current Dose per office note 05/02/2019: Humira 40 mg every 14 days   Okay to refill per Dr. Corliss Skains

## 2019-07-11 ENCOUNTER — Other Ambulatory Visit: Payer: Self-pay | Admitting: Physician Assistant

## 2019-07-11 NOTE — Telephone Encounter (Signed)
Last Visit: 05/02/2019 Next Visit: 09/26/2019  Okay to refill per Dr. Corliss Skains

## 2019-07-17 ENCOUNTER — Other Ambulatory Visit: Payer: Self-pay | Admitting: Rheumatology

## 2019-07-17 DIAGNOSIS — M0609 Rheumatoid arthritis without rheumatoid factor, multiple sites: Secondary | ICD-10-CM

## 2019-07-17 NOTE — Telephone Encounter (Signed)
Last Visit: 05/02/2019 Next Visit: 09/26/2019 Labs: 05/02/2019 Glucose is 114. Rest of CMP WNL. Absolute eosinophils are borderline elevated. WBC count is WNL. Rest of CBC WNL.  Current Dose per office note 05/02/2019: Rasuvo 20 mg every 7 days  Okay to refill per Dr. Corliss Skains

## 2019-07-23 ENCOUNTER — Other Ambulatory Visit: Payer: Self-pay | Admitting: Rheumatology

## 2019-07-23 DIAGNOSIS — M0609 Rheumatoid arthritis without rheumatoid factor, multiple sites: Secondary | ICD-10-CM

## 2019-07-23 NOTE — Telephone Encounter (Addendum)
Last Visit: 05/02/2019 Next Visit: 09/26/2019 Labs: 05/02/2019 Glucose is 114. Rest of CMP WNL. Absolute eosinophils are borderline elevated. WBC count is WNL. Rest of CBC WNL. TB Gold: 01/29/2019 Neg   Current Dose per office note 05/02/2019: Humira 40 mg every 14 days   Patient's daughter advised she is due to update labs this month. Patient's daughter states she will bring her on 07/24/2019 to update.   Okay to refill Humira?

## 2019-07-24 ENCOUNTER — Other Ambulatory Visit: Payer: Self-pay

## 2019-07-24 DIAGNOSIS — Z79899 Other long term (current) drug therapy: Secondary | ICD-10-CM

## 2019-07-25 LAB — COMPLETE METABOLIC PANEL WITH GFR
AG Ratio: 1.3 (calc) (ref 1.0–2.5)
ALT: 18 U/L (ref 6–29)
AST: 17 U/L (ref 10–35)
Albumin: 3.9 g/dL (ref 3.6–5.1)
Alkaline phosphatase (APISO): 79 U/L (ref 37–153)
BUN: 24 mg/dL (ref 7–25)
CO2: 25 mmol/L (ref 20–32)
Calcium: 9.5 mg/dL (ref 8.6–10.4)
Chloride: 104 mmol/L (ref 98–110)
Creat: 0.82 mg/dL (ref 0.50–0.99)
GFR, Est African American: 88 mL/min/{1.73_m2} (ref >=60)
GFR, Est Non African American: 76 mL/min/{1.73_m2} (ref >=60)
Globulin: 3 g/dL (calc) (ref 1.9–3.7)
Glucose, Bld: 202 mg/dL — ABNORMAL HIGH (ref 65–99)
Potassium: 4.7 mmol/L (ref 3.5–5.3)
Sodium: 141 mmol/L (ref 135–146)
Total Bilirubin: 0.4 mg/dL (ref 0.2–1.2)
Total Protein: 6.9 g/dL (ref 6.1–8.1)

## 2019-07-25 LAB — CBC WITH DIFFERENTIAL/PLATELET
Absolute Monocytes: 310 cells/uL (ref 200–950)
Basophils Absolute: 33 cells/uL (ref 0–200)
Basophils Relative: 0.5 %
Eosinophils Absolute: 845 cells/uL — ABNORMAL HIGH (ref 15–500)
Eosinophils Relative: 12.8 %
HCT: 37.8 % (ref 35.0–45.0)
Hemoglobin: 12.1 g/dL (ref 11.7–15.5)
Lymphs Abs: 1353 cells/uL (ref 850–3900)
MCH: 29.7 pg (ref 27.0–33.0)
MCHC: 32 g/dL (ref 32.0–36.0)
MCV: 92.9 fL (ref 80.0–100.0)
MPV: 10.3 fL (ref 7.5–12.5)
Monocytes Relative: 4.7 %
Neutro Abs: 4059 cells/uL (ref 1500–7800)
Neutrophils Relative %: 61.5 %
Platelets: 205 10*3/uL (ref 140–400)
RBC: 4.07 10*6/uL (ref 3.80–5.10)
RDW: 14.3 % (ref 11.0–15.0)
Total Lymphocyte: 20.5 %
WBC: 6.6 10*3/uL (ref 3.8–10.8)

## 2019-07-25 NOTE — Progress Notes (Signed)
Glucose is elevated-202.  Please notify the patient.  Rest of CMP WNL.  Absolute eosinophils are elevated. WBC count remains WNL.  Rest of CBC WNL.

## 2019-08-21 ENCOUNTER — Other Ambulatory Visit: Payer: Self-pay | Admitting: Rheumatology

## 2019-08-21 DIAGNOSIS — M0609 Rheumatoid arthritis without rheumatoid factor, multiple sites: Secondary | ICD-10-CM

## 2019-08-21 NOTE — Telephone Encounter (Signed)
Last Visit:05/02/2019 Next Visit:09/26/2019 Labs: Glucose is elevated-202. Rest of CMP WNL. Absolute eosinophils are elevated. WBC count remains WNL. Rest of CBC WNL.  TB Gold:01/29/2019 Neg  Current Dose per office note4/29/2021:Humira 40 mg every 14 daysand  Rasuvo 20 mg every 7 days   Okay to refill Rasuvo and Humira?

## 2019-08-22 DIAGNOSIS — Z23 Encounter for immunization: Secondary | ICD-10-CM | POA: Diagnosis not present

## 2019-08-30 ENCOUNTER — Telehealth: Payer: Self-pay | Admitting: *Deleted

## 2019-08-30 NOTE — Telephone Encounter (Signed)
Left message to advise patient we received a fax from [facility] stating they have been trying to reach patient to schedule delivery of Rasuvo. They have not been successful. Call back number [phone].

## 2019-09-20 NOTE — Progress Notes (Deleted)
Office Visit Note  Patient: Connie Ross             Date of Birth: [birth year]           MRN: [id number]             PCP: Mirna Mires, MD Referring: Mirna Mires, MD Visit Date: 09/26/2019 Occupation: @GUAROCC @  Subjective:  No chief complaint on file.   History of Present Illness: Connie Ross is a 64 y.o. female ***   Activities of Daily Living:  Patient reports morning stiffness for *** {minute/hour:19697}.   Patient {ACTIONS;DENIES/REPORTS:21021675::"Denies"} nocturnal pain.  Difficulty dressing/grooming: {ACTIONS;DENIES/REPORTS:21021675::"Denies"} Difficulty climbing stairs: {ACTIONS;DENIES/REPORTS:21021675::"Denies"} Difficulty getting out of chair: {ACTIONS;DENIES/REPORTS:21021675::"Denies"} Difficulty using hands for taps, buttons, cutlery, and/or writing: {ACTIONS;DENIES/REPORTS:21021675::"Denies"}  No Rheumatology ROS completed.   PMFS History:  Patient Active Problem List   Diagnosis Date Noted  . Pain in right foot 04/06/2016  . Rheumatoid arthritis of multiple sites with negative rheumatoid factor (HCC) 02/03/2016  . High risk medication use 02/03/2016  . 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 Essential hypertension 02/03/2016  . History of diabetes mellitus 02/03/2016  . History of diabetic neuropathy 02/03/2016  . Dyslipidemia 02/03/2016  . Vitamin D deficiency 02/03/2016  . Abnormality of gait 03/15/2013  . Right knee pain 03/15/2013  . History of total knee replacement, bilateral 02/27/2013    Past Medical History:  Diagnosis Date  . Anxiety    takes no meds  . DVT (deep venous thrombosis) (HCC)   . GERD (gastroesophageal reflux disease)   . High cholesterol   . Hypertension   . Rheumatoid arthritis (HCC)   . Type II diabetes mellitus (HCC)     Family History  Adopted: Yes  Problem Relation Age of Onset  . Cancer Mother        breast  . Healthy Son   . Healthy Daughter   . Healthy Daughter    Past Surgical  History:  Procedure Laterality Date  . EYE SURGERY     bilateral cataracts  . KNEE ARTHROPLASTY    . TOTAL KNEE ARTHROPLASTY Right 02/28/2013  . TOTAL KNEE ARTHROPLASTY Right 02/27/2013   Procedure: TOTAL KNEE ARTHROPLASTY- knee;  Surgeon: 03/01/2013, MD;  Location: [facility];  Service: Orthopedics;  Laterality: Right;  Right Total Knee Arthroplasty  . TOTAL KNEE ARTHROPLASTY Left 07/04/2014   Procedure: LEFT TOTAL KNEE ARTHROPLASTY;  Surgeon: 09/04/2014, MD;  Location: [facility];  Service: Orthopedics;  Laterality: Left;   Social History   Social History Narrative  . Not on file   Immunization History  Administered Date(s) Administered  . Influenza-Unspecified 09/03/2012  . Pneumococcal Polysaccharide-23 03/01/2013     Objective: Vital Signs: LMP  (LMP Unknown)    Physical Exam   Musculoskeletal Exam: ***  CDAI Exam: CDAI Score: -- Patient Global: --; Provider Global: -- Swollen: --; Tender: -- Joint Exam 09/26/2019   No joint exam has been documented for this visit   There is currently no information documented on the homunculus. Go to the Rheumatology activity and complete the homunculus joint exam.  Investigation: No additional findings.  Imaging: No results found.  Recent Labs: Lab Results  Component Value Date   WBC 6.6 07/24/2019   HGB 12.1 07/24/2019   PLT 205 07/24/2019   NA 141 07/24/2019   K 4.7 07/24/2019   CL 104 07/24/2019   CO2 25 07/24/2019   GLUCOSE 202 (H) 07/24/2019   BUN 24 07/24/2019   CREATININE 0.82 07/24/2019  BILITOT 0.4 07/24/2019   ALKPHOS 102 06/20/2016   AST 17 07/24/2019   ALT 18 07/24/2019   PROT 6.9 07/24/2019   ALBUMIN 3.7 06/20/2016   CALCIUM 9.5 07/24/2019   GFRAA 88 07/24/2019   QFTBGOLDPLUS NEGATIVE 01/29/2019    Speciality Comments: No specialty comments available.  Procedures:  No procedures performed Allergies: Patient has no known allergies.   Assessment / Plan:     Visit Diagnoses: Rheumatoid arthritis of  multiple sites with negative rheumatoid factor (HCC)  High risk medication use  History of total knee replacement, bilateral  DDD (degenerative disc disease), lumbar  History of vitamin D deficiency  History of diabetic neuropathy  History of diabetes mellitus  Dyslipidemia  History of hypertension  Orders: No orders of the defined types were placed in this encounter.  No orders of the defined types were placed in this encounter.   Face-to-face time spent with patient was *** minutes. Greater than 50% of time was spent in counseling and coordination of care.  Follow-Up Instructions: No follow-ups on file.   Gearldine Bienenstock, PA-C  Note - This record has been created using Dragon software.  Chart creation errors have been sought, but may not always  have been located. Such creation errors do not reflect on  the standard of medical care.

## 2019-09-26 ENCOUNTER — Ambulatory Visit: Payer: BC Managed Care – PPO | Admitting: Rheumatology

## 2019-10-14 NOTE — Progress Notes (Signed)
Office Visit Note  Patient: Connie Ross             Date of Birth: [birth year]           MRN: [id number]             PCP: Iona Beard, MD Referring: Iona Beard, MD Visit Date: 10/28/2019 Occupation: _0 @  Interpreter-Veronica, patient's daughter  Subjective:  Medication monitoring.   History of Present Illness: Connie Ross is a 64 y.o. female with history of rheumatoid arthritis, osteoarthritis and degenerative disc disease.  She was accompanied by her daughter today who is her interpreter.  According the patient she has been doing well as regards to rheumatoid arthritis.  She continues to have some lower back pain which radiates into her right lower extremity.  She was seeing Dr. Sherwood Gambler before was retired now.  At this point she does not want to see a back specialist.  She denies any joint swelling or discomfort currently.  Activities of Daily Living:  Patient reports morning stiffness for 0 minutes.   Patient Reports nocturnal pain.  Difficulty dressing/grooming: Denies Difficulty climbing stairs: Reports Difficulty getting out of chair: Reports Difficulty using hands for taps, buttons, cutlery, and/or writing: Denies  Review of Systems  Constitutional: Positive for night sweats. Negative for fatigue.  HENT: Negative for mouth sores, mouth dryness and nose dryness.   Eyes: Negative for pain, itching and dryness.  Respiratory: Negative for shortness of breath and difficulty breathing.   Cardiovascular: Negative for chest pain and palpitations.  Gastrointestinal: Negative for blood in stool, constipation and diarrhea.  Endocrine: Positive for increased urination.  Genitourinary: Negative for difficulty urinating and painful urination.  Musculoskeletal: Negative for arthralgias, joint pain, joint swelling, myalgias, morning stiffness, muscle tenderness and myalgias.  Skin: Negative for color change, rash and redness.  Allergic/Immunologic: Negative  for susceptible to infections.  Neurological: Positive for night sweats. Negative for dizziness, numbness, headaches, memory loss and weakness.  Hematological: Negative for bruising/bleeding tendency.  Psychiatric/Behavioral: Negative for confusion.    PMFS History:  Patient Active Problem List   Diagnosis Date Noted  . Pain in right foot 04/06/2016  . Rheumatoid arthritis of multiple sites with negative rheumatoid factor ([place]) 02/03/2016  . High risk medication use 02/03/2016  . 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 Essential hypertension 02/03/2016  . History of diabetes mellitus 02/03/2016  . History of diabetic neuropathy 02/03/2016  . Dyslipidemia 02/03/2016  . Vitamin D deficiency 02/03/2016  . Abnormality of gait 03/15/2013  . Right knee pain 03/15/2013  . History of total knee replacement, bilateral 02/27/2013    Past Medical History:  Diagnosis Date  . Anxiety    takes no meds  . DVT (deep venous thrombosis) ([place])   . GERD (gastroesophageal reflux disease)   . High cholesterol   . Hypertension   . Rheumatoid arthritis ([place])   . Type II diabetes mellitus ([place])     Family History  Adopted: Yes  Problem Relation Age of Onset  . Cancer Mother        breast  . Healthy Son   . Healthy Daughter   . Healthy Daughter    Past Surgical History:  Procedure Laterality Date  . EYE SURGERY     bilateral cataracts  . KNEE ARTHROPLASTY    . TOTAL KNEE ARTHROPLASTY Right 02/28/2013  . TOTAL KNEE ARTHROPLASTY Right 02/27/2013   Procedure: TOTAL KNEE ARTHROPLASTY- knee;  Surgeon: Newt Minion, MD;  Location: [facility];  Service:  Orthopedics;  Laterality: Right;  Right Total Knee Arthroplasty  . TOTAL KNEE ARTHROPLASTY Left 07/04/2014   Procedure: LEFT TOTAL KNEE ARTHROPLASTY;  Surgeon: Newt Minion, MD;  Location: [facility];  Service: Orthopedics;  Laterality: Left;   Social History   Social History Narrative  . Not on file   Immunization History  Administered Date(s)  Administered  . Influenza-Unspecified 09/03/2012  . Moderna SARS-COVID-2 Vaccination 07/25/2019, 08/22/2019  . Pneumococcal Polysaccharide-23 03/01/2013     Objective: Vital Signs: BP 138/77 (BP Location: Right Arm, Patient Position: Sitting, Cuff Size: Normal)   Pulse 63   Resp 15   Ht _0  (1.549 m)   Wt 212 lb 9.6 oz (96.4 kg)   LMP  (LMP Unknown)   BMI 40.17 kg/m    Physical Exam Vitals and nursing note reviewed.  Constitutional:      Appearance: She is well-developed.  HENT:     Head: Normocephalic and atraumatic.  Eyes:     Conjunctiva/sclera: Conjunctivae normal.  Cardiovascular:     Rate and Rhythm: Normal rate and regular rhythm.     Heart sounds: Normal heart sounds.  Pulmonary:     Effort: Pulmonary effort is normal.     Breath sounds: Normal breath sounds.  Abdominal:     General: Bowel sounds are normal.     Palpations: Abdomen is soft.  Musculoskeletal:     Cervical back: Normal range of motion.  Lymphadenopathy:     Cervical: No cervical adenopathy.  Skin:    General: Skin is warm and dry.     Capillary Refill: Capillary refill takes less than 2 seconds.  Neurological:     Mental Status: She is alert and oriented to person, place, and time.  Psychiatric:        Behavior: Behavior normal.      Musculoskeletal Exam: C-spine was in good range of motion.  She has painful limited range of motion of her lumbar spine with right-sided radiculopathy.  Shoulder joints, elbow joints, wrist joints, MCPs PIPs and DIPs been good range of motion with no synovitis.  She has synovial thickening over MCP joints.  Hip joints, knee joints were in good range of motion.  She had no tenderness over ankles or MTPs.  CDAI Exam: CDAI Score: 0  Patient Global: 0 mm; Provider Global: 0 mm Swollen: 0 ; Tender: 0  Joint Exam 10/28/2019   No joint exam has been documented for this visit   There is currently no information documented on the homunculus. Go to the Rheumatology  activity and complete the homunculus joint exam.  Investigation: No additional findings.  Imaging: No results found.  Recent Labs: Lab Results  Component Value Date   WBC 6.6 07/24/2019   HGB 12.1 07/24/2019   PLT 205 07/24/2019   NA 141 07/24/2019   K 4.7 07/24/2019   CL 104 07/24/2019   CO2 25 07/24/2019   GLUCOSE 202 (H) 07/24/2019   BUN 24 07/24/2019   CREATININE 0.82 07/24/2019   BILITOT 0.4 07/24/2019   ALKPHOS 102 06/20/2016   AST 17 07/24/2019   ALT 18 07/24/2019   PROT 6.9 07/24/2019   ALBUMIN 3.7 06/20/2016   CALCIUM 9.5 07/24/2019   GFRAA 88 07/24/2019   QFTBGOLDPLUS NEGATIVE 01/29/2019    Speciality Comments: No specialty comments available.  Procedures:  No procedures performed Allergies: Patient has no known allergies.   Assessment / Plan:     Visit Diagnoses: Rheumatoid arthritis of multiple sites with negative rheumatoid factor (  [place]) - RF negative, CCP negative, elevated ESR, positive ANA, positive Ro.  Patient had no synovitis on examination today.  She is doing well on Humira and Rasuvo combination.  I have advised her to space Humira to every 3 weeks if tolerated.  High risk medication use - Humira 40 mg every 14 days and Rasuvo 20 mg every 7 days - Plan: QuantiFERON-TB Gold Plus with her next labs in January.  Patient had labs this month at her PCPs office.  They will forward CBC and CMP results to [place].  History of total knee replacement, bilateral-doing well.  She had good range of motion.  DDD (degenerative disc disease), lumbar -I have advised patient to schedule an appointment at neurosurgery office.  She used to see note of Dr. Sherwood Gambler who retired.  History of diabetic neuropathy  History of vitamin D deficiency  Dyslipidemia  History of diabetes mellitus  History of hypertension  Educated about COVID-19 virus infection -she is fully vaccinated against COVID-19.  Use of booster was discussed when it is available to her.  Use of mask,  social distancing and hand hygiene was discussed.  Use of monoclonal antibodies were discussed in case she develops COVID-19 infection.  The information was placed in the AVS.  Orders: Orders Placed This Encounter  Procedures  . QuantiFERON-TB Gold Plus   No orders of the defined types were placed in this encounter.     Follow-Up Instructions: Return in about 5 months (around 03/27/2020) for Rheumatoid arthritis, Osteoarthritis.   Bo Merino, MD  Note - This record has been created using Editor, commissioning.  Chart creation errors have been sought, but may not always  have been located. Such creation errors do not reflect on  the standard of medical care.

## 2019-10-28 ENCOUNTER — Ambulatory Visit (INDEPENDENT_AMBULATORY_CARE_PROVIDER_SITE_OTHER): Payer: BC Managed Care – PPO | Admitting: Rheumatology

## 2019-10-28 ENCOUNTER — Encounter: Payer: Self-pay | Admitting: Rheumatology

## 2019-10-28 ENCOUNTER — Other Ambulatory Visit: Payer: Self-pay

## 2019-10-28 VITALS — BP 138/77 | HR 63 | Resp 15 | Ht 61.0 in | Wt 212.6 lb

## 2019-10-28 DIAGNOSIS — I10 Essential (primary) hypertension: Secondary | ICD-10-CM | POA: Diagnosis not present

## 2019-10-28 DIAGNOSIS — Z794 Long term (current) use of insulin: Secondary | ICD-10-CM | POA: Diagnosis not present

## 2019-10-28 DIAGNOSIS — E1165 Type 2 diabetes mellitus with hyperglycemia: Secondary | ICD-10-CM | POA: Diagnosis not present

## 2019-10-28 DIAGNOSIS — M0609 Rheumatoid arthritis without rheumatoid factor, multiple sites: Secondary | ICD-10-CM | POA: Diagnosis not present

## 2019-10-28 DIAGNOSIS — Z79899 Other long term (current) drug therapy: Secondary | ICD-10-CM | POA: Diagnosis not present

## 2019-10-28 DIAGNOSIS — M5136 Other intervertebral disc degeneration, lumbar region: Secondary | ICD-10-CM

## 2019-10-28 DIAGNOSIS — Z23 Encounter for immunization: Secondary | ICD-10-CM | POA: Diagnosis not present

## 2019-10-28 DIAGNOSIS — M51369 Other intervertebral disc degeneration, lumbar region without mention of lumbar back pain or lower extremity pain: Secondary | ICD-10-CM

## 2019-10-28 DIAGNOSIS — Z96653 Presence of artificial knee joint, bilateral: Secondary | ICD-10-CM

## 2019-10-28 DIAGNOSIS — E785 Hyperlipidemia, unspecified: Secondary | ICD-10-CM

## 2019-10-28 DIAGNOSIS — Z7189 Other specified counseling: Secondary | ICD-10-CM

## 2019-10-28 DIAGNOSIS — Z8639 Personal history of other endocrine, nutritional and metabolic disease: Secondary | ICD-10-CM

## 2019-10-28 DIAGNOSIS — Z8679 Personal history of other diseases of the circulatory system: Secondary | ICD-10-CM

## 2019-10-28 DIAGNOSIS — M069 Rheumatoid arthritis, unspecified: Secondary | ICD-10-CM | POA: Diagnosis not present

## 2019-10-28 NOTE — Patient Instructions (Addendum)
COVID-19 vaccine recommendations:   COVID-19 vaccine is recommended for everyone (unless you are allergic to a vaccine component), even if you are on a medication that suppresses your immune system.   If you are on Methotrexate, Cellcept (mycophenolate), Rinvoq, Harriette Ohara, and Olumiant- hold the medication for 1 week after each vaccine. Hold Methotrexate for 2 weeks after the single dose COVID-19 vaccine.   Do not take Tylenol or any anti-inflammatory medications (NSAIDs) 24 hours prior to the COVID-19 vaccination.   There is no direct evidence about the efficacy of the COVID-19 vaccine in individuals who are on medications that suppress the immune system.   Even if you are fully vaccinated, and you are on any medications that suppress your immune system, please continue to wear a mask, maintain at least six feet social distance and practice hand hygiene.   If you develop a COVID-19 infection, please contact your PCP or our office to determine if you need antibody infusion.  The booster vaccine is now available for immunocompromised patients.   Please see the following web sites for updated information.   [email or web address]-[email or web address]  Standing Labs We placed an order today for your standing lab work.   Please have your standing labs drawn in January and every 3 months  If possible, please have your labs drawn 2 weeks prior to your appointment so that the provider can discuss your results at your appointment.  We have open lab daily Monday through Thursday from 8:30-12:30 PM and 1:30-4:30 PM and Friday from 8:30-12:30 PM and 1:30-4:00 PM at the office of Dr. Pollyann Savoy, [facility].   Please be advised, patients with office appointments requiring lab work will take precedents over walk-in lab work.  If possible, please come for your lab work on Monday and Friday afternoons, as you may experience shorter wait  times. The office is located at [address], [place], [place] [address] No appointment is necessary.   Labs are drawn by [facility]. Please bring your co-pay at the time of your lab draw.  You may receive a bill from [facility] for your lab work.  If you wish to have your labs drawn at another location, please call the office 24 hours in advance to send orders.  If you have any questions regarding directions or hours of operation,  please call [phone].   As a reminder, please drink plenty of water prior to coming for your lab work. Thanks!

## 2020-01-12 ENCOUNTER — Other Ambulatory Visit: Payer: Self-pay | Admitting: Physician Assistant

## 2020-01-12 DIAGNOSIS — M0609 Rheumatoid arthritis without rheumatoid factor, multiple sites: Secondary | ICD-10-CM

## 2020-02-10 ENCOUNTER — Other Ambulatory Visit: Payer: Self-pay

## 2020-02-10 DIAGNOSIS — Z794 Long term (current) use of insulin: Secondary | ICD-10-CM | POA: Diagnosis not present

## 2020-02-10 DIAGNOSIS — I10 Essential (primary) hypertension: Secondary | ICD-10-CM | POA: Diagnosis not present

## 2020-02-10 DIAGNOSIS — Z79899 Other long term (current) drug therapy: Secondary | ICD-10-CM | POA: Diagnosis not present

## 2020-02-10 DIAGNOSIS — M069 Rheumatoid arthritis, unspecified: Secondary | ICD-10-CM | POA: Diagnosis not present

## 2020-02-10 DIAGNOSIS — Z111 Encounter for screening for respiratory tuberculosis: Secondary | ICD-10-CM | POA: Diagnosis not present

## 2020-02-10 DIAGNOSIS — E1165 Type 2 diabetes mellitus with hyperglycemia: Secondary | ICD-10-CM | POA: Diagnosis not present

## 2020-02-10 DIAGNOSIS — E781 Pure hyperglyceridemia: Secondary | ICD-10-CM | POA: Diagnosis not present

## 2020-02-10 DIAGNOSIS — Z9225 Personal history of immunosupression therapy: Secondary | ICD-10-CM | POA: Diagnosis not present

## 2020-02-12 ENCOUNTER — Telehealth: Payer: Self-pay | Admitting: *Deleted

## 2020-02-12 LAB — QUANTIFERON-TB GOLD PLUS
Mitogen-NIL: >10 IU/mL
NIL: 0.04 IU/mL
QuantiFERON-TB Gold Plus: NEGATIVE
TB1-NIL: 0 IU/mL
TB2-NIL: 0.01 IU/mL

## 2020-02-12 NOTE — Telephone Encounter (Signed)
Labs received from:[facility] Drawn on:02/10/2020 Reviewed by: Sherron Ales, PA-C  Labs drawn:Lipid Panel, CBC w/Diff, CMP  Results: HDL 45 Glucose 211 Hemoglobin A1C 10.3 Absolute Eosinophils 1245

## 2020-03-16 NOTE — Progress Notes (Signed)
Office Visit Note  Patient: Connie Ross             Date of Birth: 1955/04/01           MRN: [id number]             PCP: Iona Beard, MD Referring: Iona Beard, MD Visit Date: 03/30/2020 Occupation: @GUAROCC @  Subjective:  Pain in multiple joints   History of Present Illness: Connie Ross is a 65 y.o. female with history of seronegative rheumatoid arthritis and osteoarthritis.  She has been off of Humira for the past 3 months. After her last office visit she was advised to try spacing humira to every 3 weeks since she was clinically doing so well.  She has continued on rasuvo 20 mg sq injections once weekly and folic acid 2 mg by mouth daily.  She denies any rheumatoid arthritis flares recently.  She has been experiencing increased pain in her lower back causing radiating pain down both legs.  She continues to use a cane to assist with ambulation.  She has noticed increased weakness in the left lower extremity.  She has not had any recent falls. She has been taking hydrocodone as needed and gabapentin as prescribed for pain relief.   She denies any recent infections.     Activities of Daily Living:  Patient reports morning stiffness for 2  hours.   Patient Reports nocturnal pain.  Difficulty dressing/grooming: Denies Difficulty climbing stairs: Reports Difficulty getting out of chair: Reports Difficulty using hands for taps, buttons, cutlery, and/or writing: Denies  Review of Systems  Constitutional: Negative for fatigue.  HENT: Positive for mouth dryness. Negative for mouth sores and nose dryness.   Eyes: Negative for pain, itching and dryness.  Respiratory: Negative for shortness of breath and difficulty breathing.   Cardiovascular: Negative for chest pain and palpitations.  Gastrointestinal: Positive for constipation. Negative for blood in stool and diarrhea.  Endocrine: Positive for increased urination.  Genitourinary: Negative for difficulty urinating.   Musculoskeletal: Positive for arthralgias, joint pain, joint swelling, myalgias, morning stiffness and myalgias. Negative for muscle tenderness.  Skin: Negative for color change, rash and redness.  Allergic/Immunologic: Negative for susceptible to infections.  Neurological: Positive for headaches. Negative for dizziness, numbness, memory loss and weakness.  Hematological: Negative for bruising/bleeding tendency.  Psychiatric/Behavioral: Positive for sleep disturbance. Negative for confusion.    PMFS History:  Patient Active Problem List   Diagnosis Date Noted  . Pain in right foot 04/06/2016  . Rheumatoid arthritis of multiple sites with negative rheumatoid factor ([place]) 02/03/2016  . High risk medication use 02/03/2016  . 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 Essential hypertension 02/03/2016  . History of diabetes mellitus 02/03/2016  . History of diabetic neuropathy 02/03/2016  . Dyslipidemia 02/03/2016  . Vitamin D deficiency 02/03/2016  . Abnormality of gait 03/15/2013  . Right knee pain 03/15/2013  . History of total knee replacement, bilateral 02/27/2013    Past Medical History:  Diagnosis Date  . Anxiety    takes no meds  . DVT (deep venous thrombosis) ([place])   . GERD (gastroesophageal reflux disease)   . High cholesterol   . Hypertension   . Rheumatoid arthritis ([place])   . Type II diabetes mellitus ([place])     Family History  Adopted: Yes  Problem Relation Age of Onset  . Cancer Mother        breast  . Healthy Son   . Healthy Daughter   . Healthy Daughter  Past Surgical History:  Procedure Laterality Date  . EYE SURGERY     bilateral cataracts  . KNEE ARTHROPLASTY    . TOTAL KNEE ARTHROPLASTY Right 02/28/2013  . TOTAL KNEE ARTHROPLASTY Right 02/27/2013   Procedure: TOTAL KNEE ARTHROPLASTY- knee;  Surgeon: Newt Minion, MD;  Location: [facility];  Service: Orthopedics;  Laterality: Right;  Right Total Knee Arthroplasty  . TOTAL KNEE ARTHROPLASTY Left  07/04/2014   Procedure: LEFT TOTAL KNEE ARTHROPLASTY;  Surgeon: Newt Minion, MD;  Location: [facility];  Service: Orthopedics;  Laterality: Left;   Social History   Social History Narrative  . Not on file   Immunization History  Administered Date(s) Administered  . Influenza-Unspecified 09/03/2012  . Moderna Sars-Covid-2 Vaccination 07/25/2019, 08/22/2019  . Pneumococcal Polysaccharide-23 03/01/2013     Objective: Vital Signs: BP (!) 147/75 (BP Location: Left Arm, Patient Position: Sitting, Cuff Size: Normal)   Pulse (!) 56   Resp 15   Ht 5' (1.524 m)   Wt 209 lb (94.8 kg)   LMP  (LMP Unknown)   BMI 40.82 kg/m    Physical Exam Vitals and nursing note reviewed.  Constitutional:      Appearance: She is well-developed.  HENT:     Head: Normocephalic and atraumatic.  Eyes:     Conjunctiva/sclera: Conjunctivae normal.  Pulmonary:     Effort: Pulmonary effort is normal.  Abdominal:     Palpations: Abdomen is soft.  Musculoskeletal:     Cervical back: Normal range of motion.  Skin:    General: Skin is warm and dry.     Capillary Refill: Capillary refill takes less than 2 seconds.  Neurological:     Mental Status: She is alert and oriented to person, place, and time.  Psychiatric:        Behavior: Behavior normal.      Musculoskeletal Exam: C-spine good ROM with no discomfort.  No trapezius muscle tension and tenderness. Painful ROM of lumbar spine.  Midline spinal tenderness in the lumbar region.  Shoulder joints, elbow joints, wrist joints, MCPs, PIPs, and DIPs good ROM with no synovitis.  Synovial thickening over MCP joints.  Complete fist formation bilaterally.  Hip joints difficult to assess in seated position.  Bilateral knee replacements have good ROM with no discomfort.  Ankle joints good ROM with no tenderness or swelling.  Pedal edema bilaterally.   CDAI Exam: CDAI Score: -- Patient Global: --; Provider Global: -- Swollen: --; Tender: -- Joint Exam 03/30/2020    No joint exam has been documented for this visit   There is currently no information documented on the homunculus. Go to the Rheumatology activity and complete the homunculus joint exam.  Investigation: No additional findings.  Imaging: No results found.  Recent Labs: Lab Results  Component Value Date   WBC 6.6 07/24/2019   HGB 12.1 07/24/2019   PLT 205 07/24/2019   NA 141 07/24/2019   K 4.7 07/24/2019   CL 104 07/24/2019   CO2 25 07/24/2019   GLUCOSE 202 (H) 07/24/2019   BUN 24 07/24/2019   CREATININE 0.82 07/24/2019   BILITOT 0.4 07/24/2019   ALKPHOS 102 06/20/2016   AST 17 07/24/2019   ALT 18 07/24/2019   PROT 6.9 07/24/2019   ALBUMIN 3.7 06/20/2016   CALCIUM 9.5 07/24/2019   GFRAA 88 07/24/2019   QFTBGOLDPLUS NEGATIVE 02/10/2020    Speciality Comments: No specialty comments available.  Procedures:  No procedures performed Allergies: Patient has no known allergies.  Assessment / Plan:     Visit Diagnoses: Rheumatoid arthritis of multiple sites with negative rheumatoid factor ([facility]) - RF negative, CCP negative, elevated ESR, positive ANA, positive Ro: She has no joint tenderness or synovitis on exam.  She has not had any recent rheumatoid arthritis flares.  She is clinically doing well on Rasuvo 20 mg subcutaneous injections once weekly and folic acid 2 mg by mouth daily.    After her last office visit on 10/28/2019 Dr. Corliss Skains recommended spacing the dose of Humira to every 21 days since her rheumatoid arthritis has been so well controlled. She discontinued Humira about 3 months ago since she was clinically doing well and did not have a refill.  She has not noticed any increased joint pain or joint swelling since discontinuing humira.  She continues to have chronic lower back pain with radiating pain down both legs (previous pt of Dr. Newell Coral).  She has started to noticed increase weakness in the left LE and continues to walk with a cane. Advised to follow up with  neurosurgery and a referral to pain management was placed today. She has not been experiencing any other joint pain or joint swelling. She will continue rasuvo 20 mg sq weekly injections as monotherapy.  She was advised to notify us if she develops increased joint pain or joint swelling, and she will return to the office to re-initiate Humira at that time.  She will follow up in 5 months.  - Plan: Ambulatory referral to Physical Medicine Rehab  High risk medication use - Rasuvo 20 mg sq injection every 7 days and folic acid 2 mg by mouth daily.  D/c Humira-December 2021-clinically doing well.  TB gold negative on 02/10/20.  CBC and CMP updated on 02/10/20.  She is due to update lab work in May and every 3 months.   She has not had any recent infections.  Advised to hold rasuvo if she develops signs or symptoms of an infection and to resume once the infection has completely cleared.  She has received 2 moderna covid-19 vaccine doses.  She was encouraged to receive the 3rd dose.  Advised to hold rasuvo for 1 week after the vaccine.   History of total knee replacement, bilateral: Doing well.  She has good range of motion with no discomfort.  She uses a cane to assist with ambulation.  DDD (degenerative disc disease), lumbar -She continues to have chronic midline lower back pain and symptoms of bilateral radiculopathy.  She has started to notice left LE muscle weakness.  She has not had any recent falls or injuries.  She continues to use a cane to assist with ambulation.  She has been taking hydrocodone and gabapentin as prescribed by her PCP. She is a previous patient of Dr. Newell Coral (retired).  Referral to pain management was place today.  - Plan: Ambulatory referral to Physical Medicine Rehab  History of diabetic neuropathy: She continues to take gabapentin as prescribed.  Other medical conditions are listed as follows:  History of vitamin D deficiency  Dyslipidemia  History of diabetes  mellitus  History of hypertension  Orders: Orders Placed This Encounter  Procedures  . Ambulatory referral to Physical Medicine Rehab   No orders of the defined types were placed in this encounter.   Follow-Up Instructions: Return in about 5 months (around 08/30/2020) for Rheumatoid arthritis, DDD.   Gearldine Bienenstock, PA-C  Note - This record has been created using Dragon software.  Chart creation errors have been  sought, but may not always  have been located. Such creation errors do not reflect on  the standard of medical care.

## 2020-03-30 ENCOUNTER — Ambulatory Visit (INDEPENDENT_AMBULATORY_CARE_PROVIDER_SITE_OTHER): Payer: BC Managed Care – PPO | Admitting: Physician Assistant

## 2020-03-30 ENCOUNTER — Encounter: Payer: Self-pay | Admitting: Physician Assistant

## 2020-03-30 ENCOUNTER — Other Ambulatory Visit: Payer: Self-pay

## 2020-03-30 VITALS — BP 147/75 | HR 56 | Resp 15 | Ht 60.0 in | Wt 209.0 lb

## 2020-03-30 DIAGNOSIS — Z8639 Personal history of other endocrine, nutritional and metabolic disease: Secondary | ICD-10-CM

## 2020-03-30 DIAGNOSIS — M0609 Rheumatoid arthritis without rheumatoid factor, multiple sites: Secondary | ICD-10-CM

## 2020-03-30 DIAGNOSIS — M5136 Other intervertebral disc degeneration, lumbar region: Secondary | ICD-10-CM

## 2020-03-30 DIAGNOSIS — Z96653 Presence of artificial knee joint, bilateral: Secondary | ICD-10-CM

## 2020-03-30 DIAGNOSIS — Z8679 Personal history of other diseases of the circulatory system: Secondary | ICD-10-CM | POA: Diagnosis not present

## 2020-03-30 DIAGNOSIS — M51369 Other intervertebral disc degeneration, lumbar region without mention of lumbar back pain or lower extremity pain: Secondary | ICD-10-CM

## 2020-03-30 DIAGNOSIS — E785 Hyperlipidemia, unspecified: Secondary | ICD-10-CM

## 2020-03-30 DIAGNOSIS — Z79899 Other long term (current) drug therapy: Secondary | ICD-10-CM | POA: Diagnosis not present

## 2020-03-30 NOTE — Patient Instructions (Signed)
Standing Labs We placed an order today for your standing lab work.   Please have your standing labs drawn in May and every 3 months   If possible, please have your labs drawn 2 weeks prior to your appointment so that the provider can discuss your results at your appointment.  We have open lab daily Monday through Thursday from 1:30-4:30 PM and Friday from 1:30-4:00 PM at the office of Dr. Shaili Deveshwar, [facility].   Please be advised, all patients with office appointments requiring lab work will take precedents over walk-in lab work.  If possible, please come for your lab work on Monday and Friday afternoons, as you may experience shorter wait times. The office is located at [address], [address], [place], [place] [address] No appointment is necessary.   Labs are drawn by [facility]. Please bring your co-pay at the time of your lab draw.  You may receive a bill from [facility] for your lab work.  If you wish to have your labs drawn at another location, please call the office 24 hours in advance to send orders.  If you have any questions regarding directions or hours of operation,  please call [phone].   As a reminder, please drink plenty of water prior to coming for your lab work. Thanks!   

## 2020-04-06 ENCOUNTER — Encounter: Payer: Self-pay | Admitting: Physical Medicine and Rehabilitation

## 2020-05-25 ENCOUNTER — Encounter: Payer: Self-pay | Admitting: Physical Medicine and Rehabilitation

## 2020-05-25 ENCOUNTER — Encounter
Payer: BC Managed Care – PPO | Attending: Physical Medicine and Rehabilitation | Admitting: Physical Medicine and Rehabilitation

## 2020-05-25 ENCOUNTER — Other Ambulatory Visit: Payer: Self-pay

## 2020-05-25 VITALS — BP 128/76 | HR 61 | Temp 98.4°F | Ht 60.0 in | Wt 204.6 lb

## 2020-05-25 DIAGNOSIS — Z79899 Other long term (current) drug therapy: Secondary | ICD-10-CM

## 2020-05-25 DIAGNOSIS — M5442 Lumbago with sciatica, left side: Secondary | ICD-10-CM | POA: Insufficient documentation

## 2020-05-25 DIAGNOSIS — Z5181 Encounter for therapeutic drug level monitoring: Secondary | ICD-10-CM | POA: Insufficient documentation

## 2020-05-25 DIAGNOSIS — G8929 Other chronic pain: Secondary | ICD-10-CM | POA: Diagnosis not present

## 2020-05-25 DIAGNOSIS — M5441 Lumbago with sciatica, right side: Secondary | ICD-10-CM | POA: Insufficient documentation

## 2020-05-25 DIAGNOSIS — M5116 Intervertebral disc disorders with radiculopathy, lumbar region: Secondary | ICD-10-CM | POA: Insufficient documentation

## 2020-05-25 DIAGNOSIS — M0609 Rheumatoid arthritis without rheumatoid factor, multiple sites: Secondary | ICD-10-CM | POA: Insufficient documentation

## 2020-05-25 DIAGNOSIS — G894 Chronic pain syndrome: Secondary | ICD-10-CM | POA: Diagnosis not present

## 2020-05-25 MED ORDER — DULOXETINE HCL 30 MG PO CPEP: 30.0000 mg | ORAL_CAPSULE | Freq: Every day | ORAL | 5 refills | Status: DC

## 2020-05-25 NOTE — Progress Notes (Signed)
Subjective:    Patient ID: Connie Ross, female    DOB: [birth year], 65 y.o.   MRN: [id number]  HPI  Pt is a 65 yr old female with RA of multiple sites, chronic Low back pain, HTN, DM, BGs - 140-170s (bad spell 2 weeks ago- was 500s) B/L knee replacement- 2018/2019; No CKD at this time.  Here for evaluation of chronic back and leg pain  Daughter [name] is interpretor.   Her primary has been writing for pain meds- but is about to retire and pain getting worse.   Takes Norco 5/325 #60- so 2x/day And pain is ~ 4/10 when on the meds.  Current regimen works/helps for her. However it doesn't last long enough- lasts ~ 8 hours usually when takes the medicine.  So, if took it 3x/day, thinks it would be helpful.   Hurts most in low back on both sides and R knee  When the pain is really bad, will also feel numbness and like her "back will give out"  Uses cane to walk- last fell 2-3 months ago- didn't feel any different.  Also has nerve pain that radiates down :E's down to feet- sometimes one; sometimes the other.    Not on anything for nerve pain.   Tried: Gabapentin- no side effects- low dose- didn't help.  No other nerve pain meds.     Pain Inventory Average Pain 9 Pain Right Now 6 My pain is constant and aching  In the last 24 hours, has pain interfered with the following? General activity 8 Relation with others 9 Enjoyment of life 10 What TIME of day is your pain at its worst? daytime and night Sleep (in general) Fair  Pain is worse with: some activites Pain improves with: medication Relief from Meds: 9   Mobility Use a cane Able to climb steps  Function Disabled since 2005  I need assistance with house hold duitis  Family History  Adopted: Yes  Problem Relation Age of Onset  . Cancer Mother        breast  . Healthy Son   . Healthy Daughter   . Healthy Daughter    Social History   Socioeconomic History  . Marital status: Married    Spouse  name: Not on file  . Number of children: Not on file  . Years of education: Not on file  . Highest education level: Not on file  Occupational History  . Not on file  Tobacco Use  . Smoking status: Never Smoker  . Smokeless tobacco: Never Used  Vaping Use  . Vaping Use: Never used  Substance and Sexual Activity  . Alcohol use: No  . Drug use: No  . Sexual activity: Not Currently  Other Topics Concern  . Not on file  Social History Narrative  . Not on file   Social Determinants of Health   Financial Resource Strain: Not on file  Food Insecurity: Not on file  Transportation Needs: Not on file  Physical Activity: Not on file  Stress: Not on file  Social Connections: Not on file   Past Surgical History:  Procedure Laterality Date  . EYE SURGERY     bilateral cataracts  . KNEE ARTHROPLASTY    . TOTAL KNEE ARTHROPLASTY Right 02/28/2013  . TOTAL KNEE ARTHROPLASTY Right 02/27/2013   Procedure: TOTAL KNEE ARTHROPLASTY- knee;  Surgeon: Nadara Mustard, MD;  Location: [facility];  Service: Orthopedics;  Laterality: Right;  Right Total Knee Arthroplasty  . TOTAL KNEE ARTHROPLASTY  Left 07/04/2014   Procedure: LEFT TOTAL KNEE ARTHROPLASTY;  Surgeon: Nadara Mustard, MD;  Location: [facility];  Service: Orthopedics;  Laterality: Left;   Past Surgical History:  Procedure Laterality Date  . EYE SURGERY     bilateral cataracts  . KNEE ARTHROPLASTY    . TOTAL KNEE ARTHROPLASTY Right 02/28/2013  . TOTAL KNEE ARTHROPLASTY Right 02/27/2013   Procedure: TOTAL KNEE ARTHROPLASTY- knee;  Surgeon: Nadara Mustard, MD;  Location: [facility];  Service: Orthopedics;  Laterality: Right;  Right Total Knee Arthroplasty  . TOTAL KNEE ARTHROPLASTY Left 07/04/2014   Procedure: LEFT TOTAL KNEE ARTHROPLASTY;  Surgeon: Nadara Mustard, MD;  Location: [facility];  Service: Orthopedics;  Laterality: Left;   Past Medical History:  Diagnosis Date  . Anxiety    takes no meds  . DVT (deep venous thrombosis) (HCC)   . GERD (gastroesophageal  reflux disease)   . High cholesterol   . Hypertension   . Rheumatoid arthritis (HCC)   . Type II diabetes mellitus (HCC)    BP 128/76   Pulse 61   Temp 98.4 F (36.9 C)   Ht 5' (1.524 m)   Wt 204 lb 9.6 oz (92.8 kg)   LMP  (LMP Unknown)   SpO2 94%   BMI 39.96 kg/m   Opioid Risk Score:   Fall Risk Score:  `1  Depression screen PHQ 2/9  No flowsheet data found. Review of Systems  Musculoskeletal: Positive for back pain and gait problem.       Pain in both legs  All other systems reviewed and are negative.      Objective:   Physical Exam Awake, alert, appropriate, using cane - sitting on table; daughter [name] sitting in room as well - interpretor, NAD MS: LE's HF 4/5 B/L; KE 5-/5 B/L and DF/PF 5/5 B/L  Neuro: Intact to light touch in B/L LEs extremities.   No change in pain with extension, rotation or flexion.  TTP over lateral aspect of paraspinals- more laterally than normal as well as midline- lower lumbar/sacral Also has TTP/muscle tightness in buttocks B/L .        Assessment & Plan:    Pt is a 65 yr old female with RA of multiple sites, chronic Low back pain, HTN, DM, BGs - 140-170s (bad spell 2 weeks ago- was 500s) B/L knee replacement- 2018/2019; No CKD at this time.  Here for evaluation of chronic back and leg pain  1. Can take over pain meds, however will increase Norco to 5/325 mg 3x/day #90- let me know in 1 week, and I can sent in Rx after UDS back.   2. Will get opiate contract and Urine drug screen prior and make sure no issues, prior to prescribing meds.   3. Tennis balls- start with low pressure on back- and then increase pressure against tennis balls against wall/back - 2-4 minutes on low back and can sit on it for buttocks- 5-10 minutes for buttocks muscle spasms.   4. Will wait on muscle spasms for now.   5. Due to sciatica/radiculopathy/pain down her leg- - will try Duloxetine 30 mg nightly x 1 week, then 60 mg nightly.  Can have  nausea- if does, call me for anti-nausea medicine.  2-3 weeks to kick in. If at next appointment hasn't worked, can try something else.   6. F/U in 2 months Call if any issues prior!   I spent a total of 35 minutes on visit- longer due to  discussing with daughter/pt due to language barrier and going over overall plan

## 2020-05-25 NOTE — Addendum Note (Signed)
Addended by: Becky Sax on: 05/25/2020 03:18 PM   Modules accepted: Orders

## 2020-05-25 NOTE — Patient Instructions (Signed)
  Pt is a 65 yr old female with RA of multiple sites, chronic Low back pain, HTN, DM, BGs - 140-170s (bad spell 2 weeks ago- was 500s) B/L knee replacement- 2018/2019; No CKD at this time.  Here for evaluation of chronic back and leg pain  1. Can take over pain meds, however will increase Norco to 5/325 mg 3x/day #90- let me know in 1 week, and I can sent in Rx after UDS back.   2. Will get opiate contract and Urine drug screen prior and make sure no issues, prior to prescribing meds.   3. Tennis balls- start with low pressure on back- and then increase pressure against tennis balls against wall/back - 2-4 minutes on low back and can sit on it for buttocks- 5-10 minutes for buttocks muscle spasms.   4. Will wait on muscle spasms for now.   5. Due to sciatica/radiculopathy/pain down her leg- - will try Duloxetine 30 mg nightly x 1 week, then 60 mg nightly.  Can have nausea- if does, call me for anti-nausea medicine.  2-3 weeks to kick in. If at next appointment hasn't worked, can try something else.   6. F/U in 2 months Call if any issues prior!

## 2020-05-29 ENCOUNTER — Telehealth: Payer: Self-pay | Admitting: Physical Medicine and Rehabilitation

## 2020-05-29 LAB — TOXASSURE SELECT,+ANTIDEPR,UR

## 2020-05-29 MED ORDER — HYDROCODONE-ACETAMINOPHEN 5-325 MG PO TABS: 1.0000 | ORAL_TABLET | Freq: Three times a day (TID) | ORAL | 0 refills | Status: DC | PRN

## 2020-05-29 NOTE — Telephone Encounter (Signed)
UDS looks OK- consistent with meds taken from prior doctor.  Will take over and give Norco 5/325 mg Q8 hours prn- #90- and will take over prescribing.

## 2020-05-29 NOTE — Telephone Encounter (Signed)
Notified. 

## 2020-06-11 ENCOUNTER — Telehealth: Payer: Self-pay | Admitting: *Deleted

## 2020-06-11 NOTE — Telephone Encounter (Signed)
Urine drug screen for this encounter is consistent for prescribed medication 

## 2020-06-17 ENCOUNTER — Other Ambulatory Visit: Payer: Self-pay | Admitting: Physical Medicine and Rehabilitation

## 2020-06-26 ENCOUNTER — Telehealth: Payer: Self-pay | Admitting: Rheumatology

## 2020-06-26 NOTE — Telephone Encounter (Signed)
Daughter calling to let you know the last two weeks patient's arms/ hands have been swelling. Patient been having joint pain, from shoulder down on both arms. Daughter feels as if patient needs to resume Humira. Please call to discuss. Daughter is working until 3:30pm today. You can leave a message letting her know if they need to schedule an appointment to discuss, or for the Humira. Please advise.

## 2020-06-26 NOTE — Telephone Encounter (Signed)
Spoke with [place] and scheduled patient an appointment for evaluation and possible Humira restart on 06/29/2020.

## 2020-06-28 NOTE — Progress Notes (Signed)
Office Visit Note  Patient: Connie Ross             Date of Birth: 09-12-1955           MRN: [id number]             PCP: Iona Beard, MD Referring: Iona Beard, MD Visit Date: 06/29/2020 Occupation: @GUAROCC @  Subjective:  Pain and swelling in multiple joints.   History of Present Illness: Clevie Prout is a 65 y.o. female with a history of seronegative rheumatoid arthritis.  She stopped Humira in December 2021 as she was doing well.  She stayed on Rasuvo 20 mg.  Subcutaneous weekly.  She recently stopped taking folic acid as well.  She has been having increased pain and swelling in her bilateral hands and bilateral feet.  She has difficulty raising her shoulders and getting dressed.  She came using a walker with her daughter today.  Activities of Daily Living:  Patient reports morning stiffness for all day. Patient Reports nocturnal pain.  Difficulty dressing/grooming: Reports Difficulty climbing stairs: Reports Difficulty getting out of chair: Reports Difficulty using hands for taps, buttons, cutlery, and/or writing: Reports  Review of Systems  Constitutional:  Positive for fatigue.  HENT:  Positive for mouth dryness. Negative for mouth sores and nose dryness.   Eyes:  Negative for pain, itching and dryness.  Respiratory:  Negative for shortness of breath and difficulty breathing.   Cardiovascular:  Negative for chest pain and palpitations.  Gastrointestinal:  Negative for blood in stool, constipation and diarrhea.  Endocrine: Negative for increased urination.  Genitourinary:  Negative for difficulty urinating.  Musculoskeletal:  Positive for joint pain, joint pain, joint swelling, myalgias, morning stiffness, muscle tenderness and myalgias.  Skin:  Negative for color change, rash and redness.  Allergic/Immunologic: Negative for susceptible to infections.  Neurological:  Positive for weakness. Negative for dizziness, numbness, headaches and memory loss.   Hematological:  Negative for bruising/bleeding tendency.  Psychiatric/Behavioral:  Negative for confusion.    PMFS History:  Patient Active Problem List   Diagnosis Date Noted   Chronic pain syndrome 05/25/2020   Radiculopathy due to disorder of intervertebral disc of lumbar spine 05/25/2020   Chronic bilateral low back pain with bilateral sciatica 05/25/2020   Pain in right foot 04/06/2016   Rheumatoid arthritis of multiple sites with negative rheumatoid factor ([place]) 02/03/2016   High risk medication use 02/03/2016   Osteoarthritis of lumbar spine 02/03/2016   Essential hypertension 02/03/2016   History of diabetes mellitus 02/03/2016   History of diabetic neuropathy 02/03/2016   Dyslipidemia 02/03/2016   Vitamin D deficiency 02/03/2016   Abnormality of gait 03/15/2013   Right knee pain 03/15/2013   History of total knee replacement, bilateral 02/27/2013    Past Medical History:  Diagnosis Date   Anxiety    takes no meds   DVT (deep venous thrombosis) (HCC)    GERD (gastroesophageal reflux disease)    High cholesterol    Hypertension    Rheumatoid arthritis ([place])    Type II diabetes mellitus ([place])     Family History  Adopted: Yes  Problem Relation Age of Onset   Cancer Mother        breast   Healthy Son    Healthy Daughter    Healthy Daughter    Past Surgical History:  Procedure Laterality Date   EYE SURGERY     bilateral cataracts   KNEE ARTHROPLASTY     TOTAL KNEE ARTHROPLASTY Right 02/28/2013  TOTAL KNEE ARTHROPLASTY Right 02/27/2013   Procedure: TOTAL KNEE ARTHROPLASTY- knee;  Surgeon: Newt Minion, MD;  Location: [facility];  Service: Orthopedics;  Laterality: Right;  Right Total Knee Arthroplasty   TOTAL KNEE ARTHROPLASTY Left 07/04/2014   Procedure: LEFT TOTAL KNEE ARTHROPLASTY;  Surgeon: Newt Minion, MD;  Location: [facility];  Service: Orthopedics;  Laterality: Left;   Social History   Social History Narrative   Not on file   Immunization History   Administered Date(s) Administered   Influenza-Unspecified 09/03/2012   Moderna Sars-Covid-2 Vaccination 07/25/2019, 08/22/2019   Pneumococcal Polysaccharide-23 03/01/2013     Objective: Vital Signs: BP 132/76 (BP Location: Left Arm, Patient Position: Sitting, Cuff Size: Normal)   Pulse 78   Resp 16   Ht 5' (1.524 m)   Wt 200 lb 12.8 oz (91.1 kg)   LMP  (LMP Unknown)   BMI 39.22 kg/m    Physical Exam Vitals and nursing note reviewed.  Constitutional:      Appearance: She is well-developed.  HENT:     Head: Normocephalic and atraumatic.  Eyes:     Conjunctiva/sclera: Conjunctivae normal.  Cardiovascular:     Rate and Rhythm: Normal rate and regular rhythm.     Heart sounds: Normal heart sounds.  Pulmonary:     Effort: Pulmonary effort is normal.     Breath sounds: Normal breath sounds.  Abdominal:     General: Bowel sounds are normal.     Palpations: Abdomen is soft.  Musculoskeletal:     Cervical back: Normal range of motion.  Lymphadenopathy:     Cervical: No cervical adenopathy.  Skin:    General: Skin is warm and dry.     Capillary Refill: Capillary refill takes less than 2 seconds.  Neurological:     Mental Status: She is alert and oriented to person, place, and time.  Psychiatric:        Behavior: Behavior normal.     Musculoskeletal Exam: C-spine was in good range of motion.  She had painful range of motion of bilateral shoulders.  She had right elbow joint contracture.  She has synovitis over bilateral MCPs and PIPs.  She was using her walker and had warmth and swelling in her bilateral knee joints and ankle joints.  There was some tenderness over MTPs.  CDAI Exam: CDAI Score: 46.6  Patient Global: 8 mm; Provider Global: 8 mm Swollen: 23 ; Tender: 31  Joint Exam 06/29/2020      Right  Left  Glenohumeral   Tender   Tender  Elbow   Tender     Wrist  Swollen Tender  Swollen Tender  MCP 1  Swollen Tender  Swollen Tender  MCP 2  Swollen Tender  Swollen  Tender  MCP 3  Swollen Tender  Swollen Tender  MCP 4     Swollen Tender  MCP 5  Swollen Tender  Swollen Tender  PIP 2  Swollen Tender  Swollen Tender  PIP 3  Swollen Tender  Swollen Tender  PIP 4  Swollen Tender  Swollen Tender  PIP 5  Swollen Tender  Swollen Tender  Knee  Swollen Tender  Swollen Tender  Ankle  Swollen Tender  Swollen Tender  MTP 2   Tender   Tender  MTP 3   Tender     MTP 4      Tender  MTP 5   Tender        Investigation: No additional findings.  Imaging: No results  found.  Recent Labs: Lab Results  Component Value Date   WBC 6.6 07/24/2019   HGB 12.1 07/24/2019   PLT 205 07/24/2019   NA 141 07/24/2019   K 4.7 07/24/2019   CL 104 07/24/2019   CO2 25 07/24/2019   GLUCOSE 202 (H) 07/24/2019   BUN 24 07/24/2019   CREATININE 0.82 07/24/2019   BILITOT 0.4 07/24/2019   ALKPHOS 102 06/20/2016   AST 17 07/24/2019   ALT 18 07/24/2019   PROT 6.9 07/24/2019   ALBUMIN 3.7 06/20/2016   CALCIUM 9.5 07/24/2019   GFRAA 88 07/24/2019   QFTBGOLDPLUS NEGATIVE 02/10/2020    Speciality Comments: No specialty comments available.  Procedures:  No procedures performed Allergies: Patient has no known allergies.   Assessment / Plan:     Visit Diagnoses: Rheumatoid arthritis of multiple sites with negative rheumatoid factor (HCC) - RF negative, CCP negative, elevated ESR, positive ANA, positive Ro.  Patient is accompanied by her daughter today.  She is having a severe flare of rheumatoid arthritis.  She has been using a walker for the last 2 weeks.  She is having difficulty getting dressed.  She has synovitis in multiple joints as described about.  She also had contracture in the right elbow.  She had difficulty raising her arms.  I am uncertain if she is on Rasuvo.  Her last labs were in July 2021.  He had detailed discussion with the patient and her daughter.  Her daughter is interpreter.  We will give her a prednisone taper starting at 15 mg p.o. daily as she is  diabetic.  She will have to monitor blood sugar closely.  We will taper prednisone by 5 mg every week as she will need patient assistance to get her medications. The plan is to start her on methotrexate subcutaneous and Humira after the labs are available .  High risk medication use -(Rasuvo 20 mg sq injection every 7 days and folic acid 2 mg by mouth daily-I am uncertain if patient is taking it.).  D/c Humira-December 2021-because she was clinically doing well.TB gold negative on 02/10/20. - Plan: CBC with Differential/Platelet, COMPLETE METABOLIC PANEL WITH GFR, today and then every 3 months.  CBC with Differential/Platelet, COMPLETE METABOLIC PANEL WITH GFR.  Patient has been advised regarding getting a booster for COVID-19 vaccine.  The dose of methotrexate will be delayed by 1 week after methotrexate dose.  She has been advised to hold Humira and methotrexate in case she develops an infection and restart the medications once the infection resolves.  She is also advised to have annual skin examination while she is on Humira to screen for nonmelanoma skin cancer.  Need to get labs every 3 months to monitor for drug toxicity was emphasized.  History of total knee replacement, bilateral - uses a cane for ambulation.  She has been using a walker for the last 2 weeks.  DDD (degenerative disc disease), lumbar - chronic midline lower back pain and symptoms of bilateral radiculopathy.left LE muscle weakness.Referral to pain management was placed in 03/22.  History of hypertension-blood pressure is normal today.  Dyslipidemia  History of diabetes mellitus  History of diabetic neuropathy  History of vitamin D deficiency  Orders: Orders Placed This Encounter  Procedures   CBC with Differential/Platelet   COMPLETE METABOLIC PANEL WITH GFR   CBC with Differential/Platelet   COMPLETE METABOLIC PANEL WITH GFR    Meds ordered this encounter  Medications   folic acid (FOLVITE) 1 MG tablet  Sig: Take  2 tablets (2 mg total) by mouth daily.    Dispense:  180 tablet    Refill:  3     Follow-Up Instructions: Return in about 6 weeks (around 08/10/2020) for Rheumatoid arthritis.   Bo Merino, MD  Note - This record has been created using Editor, commissioning.  Chart creation errors have been sought, but may not always  have been located. Such creation errors do not reflect on  the standard of medical care.

## 2020-06-29 ENCOUNTER — Other Ambulatory Visit: Payer: Self-pay

## 2020-06-29 ENCOUNTER — Encounter: Payer: Self-pay | Admitting: Rheumatology

## 2020-06-29 ENCOUNTER — Telehealth: Payer: Self-pay | Admitting: Pharmacist

## 2020-06-29 ENCOUNTER — Ambulatory Visit (INDEPENDENT_AMBULATORY_CARE_PROVIDER_SITE_OTHER): Payer: BC Managed Care – PPO | Admitting: Rheumatology

## 2020-06-29 VITALS — BP 132/76 | HR 78 | Resp 16 | Ht 60.0 in | Wt 200.8 lb

## 2020-06-29 DIAGNOSIS — Z8679 Personal history of other diseases of the circulatory system: Secondary | ICD-10-CM

## 2020-06-29 DIAGNOSIS — M5136 Other intervertebral disc degeneration, lumbar region: Secondary | ICD-10-CM | POA: Diagnosis not present

## 2020-06-29 DIAGNOSIS — E785 Hyperlipidemia, unspecified: Secondary | ICD-10-CM

## 2020-06-29 DIAGNOSIS — M0609 Rheumatoid arthritis without rheumatoid factor, multiple sites: Secondary | ICD-10-CM | POA: Diagnosis not present

## 2020-06-29 DIAGNOSIS — M51369 Other intervertebral disc degeneration, lumbar region without mention of lumbar back pain or lower extremity pain: Secondary | ICD-10-CM

## 2020-06-29 DIAGNOSIS — Z8639 Personal history of other endocrine, nutritional and metabolic disease: Secondary | ICD-10-CM | POA: Diagnosis not present

## 2020-06-29 DIAGNOSIS — Z79899 Other long term (current) drug therapy: Secondary | ICD-10-CM | POA: Diagnosis not present

## 2020-06-29 DIAGNOSIS — Z96653 Presence of artificial knee joint, bilateral: Secondary | ICD-10-CM | POA: Diagnosis not present

## 2020-06-29 MED ORDER — FOLIC ACID 1 MG PO TABS: 2.0000 mg | ORAL_TABLET | Freq: Every day | ORAL | 3 refills | Status: DC

## 2020-06-29 MED ORDER — PREDNISONE 5 MG PO TABS: ORAL_TABLET | ORAL | 0 refills | Status: AC

## 2020-06-29 NOTE — Telephone Encounter (Signed)
Please start Humira AND Rasuvo BIV.  Dose: Humira 40mg  every 14 days  Dose: Rasuvo 20mg  SQ every 7 days  Dx: M06.9 (rheumatoid arthritis)  Was previously taking Humira + Rasuvo but has had interruption in therapy d/t non-adherence to labs  She has insurance through her spouse's employer - does not have Part D insurance. Daughter states her copay is $5 and needs new Rasuvo and Humira copay cards.  , PharmD, MPH Clinical Pharmacist (Rheumatology and Pulmonology)

## 2020-06-29 NOTE — Patient Instructions (Signed)
Standing Labs We placed an order today for your standing lab work.   Please have your standing labs drawn in 1 month after starting methotrexate and every 3 months  If possible, please have your labs drawn 2 weeks prior to your appointment so that the provider can discuss your results at your appointment.  Please note that you may see your imaging and lab results in MyChart before we have reviewed them. We may be awaiting multiple results to interpret others before contacting you. Please allow our office up to 72 hours to thoroughly review all of the results before contacting the office for clarification of your results.  We have open lab daily: Monday through Thursday from 1:30-4:30 PM and Friday from 1:30-4:00 PM at the office of Dr. Pollyann Savoy, [facility].   Please be advised, all patients with office appointments requiring lab work will take precedent over walk-in lab work.  If possible, please come for your lab work on Monday and Friday afternoons, as you may experience shorter wait times. The office is located at [address], [address], [place], [place] [address] No appointment is necessary.   Labs are drawn by [facility]. Please bring your co-pay at the time of your lab draw.  You may receive a bill from [facility] for your lab work.  If you wish to have your labs drawn at another location, please call the office 24 hours in advance to send orders.  COVID-19 vaccine recommendations:   COVID-19 vaccine is recommended for everyone (unless you are allergic to a vaccine component), even if you are on a medication that suppresses your immune system.   If you are on Methotrexate, Cellcept (mycophenolate), Rinvoq, Harriette Ohara, and Olumiant- hold the medication for 1 week after each vaccine. Hold Methotrexate for 2 weeks after the single dose COVID-19 vaccine.   If you are on Orencia subcutaneous injection - hold medication one week prior to and one week after the first COVID-19  vaccine dose (only).   If you are on Orencia IV infusions- time vaccination administration so that the first COVID-19 vaccination will occur four weeks after the infusion and postpone the subsequent infusion by one week.   If you are on Cyclophosphamide or Rituxan infusions please contact your doctor prior to receiving the COVID-19 vaccine.   Do not take Tylenol or any anti-inflammatory medications (NSAIDs) 24 hours prior to the COVID-19 vaccination.   There is no direct evidence about the efficacy of the COVID-19 vaccine in individuals who are on medications that suppress the immune system.   Even if you are fully vaccinated, and you are on any medications that suppress your immune system, please continue to wear a mask, maintain at least six feet social distance and practice hand hygiene.   If you develop a COVID-19 infection, please contact your PCP or our office to determine if you need monoclonal antibody infusion.  The booster vaccine is now available for immunocompromised patients.   Please see the following web sites for updated information.   [email or web address]     If you have any questions regarding directions or hours of operation,  please call [phone].   As a reminder, please drink plenty of water prior to coming for your lab work. Thanks!   Vaccines You are taking a medication(s) that can suppress your immune system.  The following immunizations are recommended: Flu annually Covid-19  Td/Tdap (tetanus, diphtheria, pertussis) every 10 years Pneumonia (Prevnar 15 then Pneumovax 23 at least 1 year apart.  Alternatively,  can take Prevnar 20 without needing additional dose) Shingrix (after age 26): 2 doses from 4 weeks to 6 months apart  Please check with your PCP to make sure you are up to date.   If you test POSITIVE for COVID19 and have MILD to MODERATE symptoms: First, call your PCP if you would  like to receive COVID19 treatment AND Hold your medications during the infection and for at least 1 week after your symptoms have resolved: Injectable medication (Benlysta, Cimzia, Cosentyx, Enbrel, Humira, Orencia, Remicade, Simponi, Stelara, Taltz, Tremfya) Methotrexate Leflunomide (Arava) Mycophenolate (Cellcept) Harriette Ohara, Olumiant, or Rinvoq If you take Actemra or Kevzara, you DO NOT need to hold these for COVID19 infection.  If you test POSITIVE for COVID19 and have NO symptoms: First, call your PCP if you would like to receive COVID19 treatment AND Hold your medications for at least 10 days after the day that you tested positive Injectable medication (Benlysta, Cimzia, Cosentyx, Enbrel, Humira, Orencia, Remicade, Simponi, Stelara, Taltz, Tremfya) Methotrexate Leflunomide (Arava) Mycophenolate (Cellcept) Harriette Ohara, Olumiant, or Rinvoq If you take Actemra or Kevzara, you DO NOT need to hold these for COVID19 infection.  If you have signs or symptoms of an infection or start antibiotics: First, call your PCP for workup of your infection. Hold your medication through the infection, until you complete your antibiotics, and until symptoms resolve if you take the following: Injectable medication (Actemra, Benlysta, Cimzia, Cosentyx, Enbrel, Humira, Kevzara, Orencia, Remicade, Simponi, Stelara, Taltz, Tremfya) Methotrexate Leflunomide (Arava) Mycophenolate (Cellcept) Harriette Ohara, Olumiant, or Rinvoq  Heart Disease Prevention   Your inflammatory disease increases your risk of heart disease which includes heart attack, stroke, atrial fibrillation (irregular heartbeats), high blood pressure, heart failure and atherosclerosis (plaque in the arteries).  It is important to reduce your risk by:   Keep blood pressure, cholesterol, and blood sugar at healthy levels   Smoking Cessation   Maintain a healthy weight  BMI 20-25   Eat a healthy diet  Plenty of fresh fruit, vegetables, and whole grains   Limit saturated fats, foods high in sodium, and added sugars  DASH and Mediterranean diet   Increase physical activity  Recommend moderate physically activity for 150 minutes per week/ 30 minutes a day for five days a week These can be broken up into three separate ten-minute sessions during the day.   Reduce Stress  Meditation, slow breathing exercises, yoga, coloring books  Dental visits twice a year     You need annual skin examination with a dermatologist to screen for nonmelanoma skin cancer while you are on Humira.

## 2020-06-29 NOTE — Telephone Encounter (Signed)
Submitted a Prior Authorization request to [employer] for HUMIRA via CoverMyMeds. Will update once we receive a response.  Key: [id number]   Submitted a Prior Authorization request to [employer] for RASUVO via CoverMyMeds. Will update once we receive a response.  Key: [id number]  Added patient into [employer] Complete Portal, awaiting BIV completion for her copay card info. Patient can re-enroll in Rasuvo copay card online.

## 2020-06-30 ENCOUNTER — Telehealth: Payer: Self-pay

## 2020-06-30 LAB — COMPLETE METABOLIC PANEL WITH GFR
AG Ratio: 1 (calc) (ref 1.0–2.5)
ALT: 15 U/L (ref 6–29)
AST: 13 U/L (ref 10–35)
Albumin: 3.6 g/dL (ref 3.6–5.1)
Alkaline phosphatase (APISO): 99 U/L (ref 37–153)
BUN: 20 mg/dL (ref 7–25)
CO2: 30 mmol/L (ref 20–32)
Calcium: 9.4 mg/dL (ref 8.6–10.4)
Chloride: 99 mmol/L (ref 98–110)
Creat: 0.65 mg/dL (ref 0.50–0.99)
GFR, Est African American: 108 mL/min/{1.73_m2} (ref >=60)
GFR, Est Non African American: 93 mL/min/{1.73_m2} (ref >=60)
Globulin: 3.7 g/dL (calc) (ref 1.9–3.7)
Glucose, Bld: 189 mg/dL — ABNORMAL HIGH (ref 65–99)
Potassium: 4.6 mmol/L (ref 3.5–5.3)
Sodium: 138 mmol/L (ref 135–146)
Total Bilirubin: 0.4 mg/dL (ref 0.2–1.2)
Total Protein: 7.3 g/dL (ref 6.1–8.1)

## 2020-06-30 LAB — CBC WITH DIFFERENTIAL/PLATELET
Absolute Monocytes: 542 cells/uL (ref 200–950)
Basophils Absolute: 38 cells/uL (ref 0–200)
Basophils Relative: 0.4 %
Eosinophils Absolute: 922 cells/uL — ABNORMAL HIGH (ref 15–500)
Eosinophils Relative: 9.7 %
HCT: 34 % — ABNORMAL LOW (ref 35.0–45.0)
Hemoglobin: 10.9 g/dL — ABNORMAL LOW (ref 11.7–15.5)
Lymphs Abs: 1444 cells/uL (ref 850–3900)
MCH: 29.7 pg (ref 27.0–33.0)
MCHC: 32.1 g/dL (ref 32.0–36.0)
MCV: 92.6 fL (ref 80.0–100.0)
MPV: 9.3 fL (ref 7.5–12.5)
Monocytes Relative: 5.7 %
Neutro Abs: 6555 cells/uL (ref 1500–7800)
Neutrophils Relative %: 69 %
Platelets: 298 10*3/uL (ref 140–400)
RBC: 3.67 10*6/uL — ABNORMAL LOW (ref 3.80–5.10)
RDW: 13.3 % (ref 11.0–15.0)
Total Lymphocyte: 15.2 %
WBC: 9.5 10*3/uL (ref 3.8–10.8)

## 2020-06-30 MED ORDER — HYDROCODONE-ACETAMINOPHEN 5-325 MG PO TABS: 1.0000 | ORAL_TABLET | Freq: Three times a day (TID) | ORAL | 0 refills | Status: DC | PRN

## 2020-06-30 NOTE — Progress Notes (Signed)
Anemia noted.  Most likely due to chronic inflammation.  Glucose is elevated.  Please forward results to her PCP.  Patient may start on Rasuvo and Humira.  She should have labs and 1 month and then every 3 months to monitor for drug toxicity.

## 2020-06-30 NOTE — Telephone Encounter (Signed)
Daughter requested Hydrocodone refill for Connie Ross per PMP last filled was 05/29/2020 her next appt is 07/27/2020.

## 2020-07-01 ENCOUNTER — Other Ambulatory Visit (HOSPITAL_COMMUNITY): Payer: Self-pay

## 2020-07-01 NOTE — Telephone Encounter (Signed)
Received notification from [facility] [employer] regarding a prior authorization for HUMIRA. Authorization has been APPROVED from 06/30/20 to 06/30/21.   Pending [employer] portal copay card information  Authorization # [id number]  Rasuvo PA still pending in [facility]  Chesley Mires, PharmD, MPH Clinical Pharmacist (Rheumatology and Pulmonology)

## 2020-07-01 NOTE — Telephone Encounter (Deleted)
Received notification from [facility] regarding a prior authorization for TALTZ. Quantity limit exception for loading dose has been APPROVED from 06/16/20 to 06/15/21.   Patient can fill through [facility]  Authorization # [id number]  Chesley Mires, PharmD, MPH Clinical Pharmacist (Rheumatology and Pulmonology)

## 2020-07-01 NOTE — Telephone Encounter (Signed)
Patient contacted, lvm.  °

## 2020-07-01 NOTE — Telephone Encounter (Signed)
Received request from plan to submit chart notes documenting previous trial and failure of Rasuvo alternatives. Information gathered and sent to [phone].  Phone# [phone]

## 2020-07-03 ENCOUNTER — Other Ambulatory Visit (HOSPITAL_COMMUNITY): Payer: Self-pay

## 2020-07-03 NOTE — Telephone Encounter (Signed)
Received notification from [facility] [employer] regarding a prior authorization for RASUVO. Authorization has been APPROVED from 07/02/2020 to 07/02/2021.   Patient can fill through [facility]: [phone]   Authorization # [id number]   Will f/u on [employer] Complete Portal.

## 2020-07-03 NOTE — Telephone Encounter (Signed)
Patient enrolled in Humira Complete savings card  Card: [id number] Issued: 07/02/2020 Rx GROUP: [id number] Rx BIN: [id number] Rx PCN: [id number]   Called Veronica via interpreter [place], [phone], to enroll patient into RASUVO copay card  since there is no e-mail on file but unable to reach Interpreter left VM requesting [name] return call  Chesley Mires, PharmD, MPH Clinical Pharmacist (Rheumatology and Pulmonology)

## 2020-07-07 ENCOUNTER — Other Ambulatory Visit (HOSPITAL_COMMUNITY): Payer: Self-pay

## 2020-07-07 ENCOUNTER — Ambulatory Visit: Payer: Medicare Other | Admitting: Pharmacist

## 2020-07-07 ENCOUNTER — Other Ambulatory Visit: Payer: Self-pay

## 2020-07-07 VITALS — BP 142/69 | HR 67

## 2020-07-07 DIAGNOSIS — Z79899 Other long term (current) drug therapy: Secondary | ICD-10-CM

## 2020-07-07 DIAGNOSIS — M0609 Rheumatoid arthritis without rheumatoid factor, multiple sites: Secondary | ICD-10-CM

## 2020-07-07 DIAGNOSIS — Z7189 Other specified counseling: Secondary | ICD-10-CM

## 2020-07-07 MED ORDER — RASUVO 20 MG/0.4ML ~~LOC~~ SOAJ
SUBCUTANEOUS | 0 refills | Status: DC
  Filled 2020-07-07: qty 4.8, fill #0
  Filled 2020-07-07 (×2): qty 1.6, 28d supply, fill #0

## 2020-07-07 MED ORDER — HUMIRA (2 PEN) 40 MG/0.4ML ~~LOC~~ AJKT
40.0000 mg | AUTO-INJECTOR | SUBCUTANEOUS | 0 refills | Status: DC
  Filled 2020-07-07: qty 6, fill #0
  Filled 2020-07-07 (×2): qty 2, 28d supply, fill #0
  Filled 2020-08-14: qty 2, 28d supply, fill #1
  Filled 2020-09-16: qty 2, 28d supply, fill #2

## 2020-07-07 NOTE — Telephone Encounter (Signed)
Attempted to enroll patient into Rasuvo copay card today. Unable to enroll since she has copay card on file  Called [employer] for copay card billing info: ID:  [id number] PCN: [id number] Group: [id number] BIN:  [id number] Effective: 02/12/2018  Chesley Mires, PharmD, MPH Clinical Pharmacist (Rheumatology and Pulmonology)

## 2020-07-07 NOTE — Patient Instructions (Addendum)
Your next Humira dose is due on 7/19, 8/2, and every 14 days thereafter. Your Rasuvo is due every week.  Both of your prescriptions will be shipped from [facility]. Their phone number is [phone] They will call to schedule shipment and confirm address. They will mail both medications to your home.  Your Humira copay should be affordable. If you call the pharmacy and it is not affordable, please double-check that they are billing through your copay card as secondary coverage. That copay card information is: ID: [id number] Issued: 07/02/2020 GROUP: [id number] BIN: [id number] PCN: [id number]  Your Rasuvo copay card information: ID:  [id number] PCN: [id number] Group: [id number] BIN:  [id number] Effective: 02/12/2018   Labs are due in 1 month then every 3 months.  Remember the 5 C's: COUNTER - leave on the counter at least 30 minutes but up to overnight to bring medication to room temperature. This may help prevent stinging COLD - place something cold (like an ice gel pack or cold water bottle) on the injection site just before cleansing with alcohol. This may help reduce pain CLARITIN - use Claritin (generic name is loratadine) for the first two weeks of treatment or the day of, the day before, and the day after injecting. This will help to minimize injection site reactions CORTISONE CREAM - apply if injection site is irritated and itching CALL ME - if injection site reaction is bigger than the size of your fist, looks infected, blisters, or if you develop hives  Standing Labs We placed an order today for your standing lab work.   Please have your standing labs drawn in 1 month then every 3 months  If possible, please have your labs drawn 2 weeks prior to your appointment so that the provider can discuss your results at your appointment.  Please note that you may see your imaging and lab results in MyChart before we have reviewed them. We may be awaiting multiple  results to interpret others before contacting you. Please allow our office up to 72 hours to thoroughly review all of the results before contacting the office for clarification of your results.  We have open lab daily: Monday through Thursday from 1:30-4:30 PM and Friday from 1:30-4:00 PM at the office of Dr. Pollyann Savoy, [facility].   Please be advised, all patients with office appointments requiring lab work will take precedent over walk-in lab work.  If possible, please come for your lab work on Monday and Friday afternoons, as you may experience shorter wait times. The office is located at [address], [place], [place] [address] No appointment is necessary.   Labs are drawn by [facility]. Please bring your co-pay at the time of your lab draw.  You may receive a bill from [facility] for your lab work.  If you wish to have your labs drawn at another location, please call the office 24 hours in advance to send orders.  If you have any questions regarding directions or hours of operation,  please call [phone].   As a reminder, please drink plenty of water prior to coming for your lab work. Thanks!

## 2020-07-07 NOTE — Progress Notes (Signed)
Pharmacy Note  Subjective:   [name] presents to clinic today with her daughter, [name], to restart Humira for rheumatoid arthritis.  [name] is able to translate to Spanish for her mother on my behalf. She was last seen on 06/29/20 by Dr. Corliss Skains and it was determined that she had been off of Humira for several months likely due to non-compliance with lab monitoring.  She states she has been compliant with Rasuvo. Per dispense report in Epic, the most recent fill was December 2021 and new rx has not been sent to pharmacy for >3 months, so this remains unclear since dispense report is not always accurate  Patient running a fever or have signs/symptoms of infection? No  Patient currently on antibiotics for the treatment of infection? No  Patient have any upcoming invasive procedures/surgeries? No  Objective: CMP     Component Value Date/Time   NA 138 06/29/2020 1013   K 4.6 06/29/2020 1013   CL 99 06/29/2020 1013   CO2 30 06/29/2020 1013   GLUCOSE 189 (H) 06/29/2020 1013   BUN 20 06/29/2020 1013   CREATININE 0.65 06/29/2020 1013   CALCIUM 9.4 06/29/2020 1013   PROT 7.3 06/29/2020 1013   ALBUMIN 3.7 06/20/2016 1642   AST 13 06/29/2020 1013   ALT 15 06/29/2020 1013   ALKPHOS 102 06/20/2016 1642   BILITOT 0.4 06/29/2020 1013   GFRNONAA 93 06/29/2020 1013   GFRAA 108 06/29/2020 1013    CBC    Component Value Date/Time   WBC 9.5 06/29/2020 1013   RBC 3.67 (L) 06/29/2020 1013   HGB 10.9 (L) 06/29/2020 1013   HCT 34.0 (L) 06/29/2020 1013   PLT 298 06/29/2020 1013   MCV 92.6 06/29/2020 1013   MCH 29.7 06/29/2020 1013   MCHC 32.1 06/29/2020 1013   RDW 13.3 06/29/2020 1013   LYMPHSABS 1,444 06/29/2020 1013   MONOABS 402 06/20/2016 1642   EOSABS 922 (H) 06/29/2020 1013   BASOSABS 38 06/29/2020 1013    Baseline Immunosuppressant Therapy Labs TB GOLD Quantiferon TB Gold Latest Ref Rng & Units 02/10/2020  Quantiferon TB Gold Plus NEGATIVE NEGATIVE   Hepatitis Panel    HIV No results found for: HIV Immunoglobulins Immunoglobulin Electrophoresis Latest Ref Rng & Units 10/03/2018  IgA  70 - 320 mg/dL 127  IgG 517 - 0,017 mg/dL 4,944  IgM 50 - 967 mg/dL 591   SPEP Serum Protein Electrophoresis Latest Ref Rng & Units 06/29/2020  Total Protein 6.1 - 8.1 g/dL 7.3  Albumin 3.8 - 4.8 g/dL -  Alpha-1 0.2 - 0.3 g/dL -  Alpha-2 0.5 - 0.9 g/dL -  Beta Globulin 0.4 - 0.6 g/dL -  Beta 2 0.2 - 0.5 g/dL -  Gamma Globulin 0.8 - 1.7 g/dL -   Chest x-ray: 6/38/46 - no active cardio-pulmonary disease  Assessment/Plan:  Counseled patient on purpose, proper use, and adverse effects of Humira.  Reviewed the most common adverse effects including infections, headache, and injection site reactions. Discussed that there is the possibility of an increased risk of malignancy including non-melanoma skin cancer but it is not well understood if this increased risk is due to the medication or the disease state.  Advised patient to get yearly dermatology exams due to risk of skin cancer. Counseled patient that Humira should be held prior to scheduled surgery.    Dermatology referral was placed today.  Sample Medication: Humira 40mg /mL auto-injector pen NDC: [phone] Lot: [id number] Expiration: 01/2021  Patient tolerated well.  Observed for 30  mins in office for adverse reaction and none noted.   Patient is to return in 1 month for labs and 6-8 weeks for follow-up appointment.  Standing orders for CBC and CMP remain in place.  Stressed importance of lab monitoring for safety with high-risk med use.  Humira and Rasuvo re-approved through insurance.  Prescription for Humira sent to [facility] along with copay card information. Family provided with pharmacy phone number and advised [name] to save in her phone and recommended for her to communicate to all of her sisters to save in their phones.    Dose: Humira 40mg  every 14 days in combination with with MTX vial/syringe 20mg  (0.59mL)  once weekly  All questions encouraged and answered.  Instructed patient to call with any further questions or concerns.  , PharmD, MPH Clinical Pharmacist (Rheumatology and Pulmonology)  07/07/2020 8:19 AM

## 2020-07-08 ENCOUNTER — Other Ambulatory Visit (HOSPITAL_COMMUNITY): Payer: Self-pay

## 2020-07-08 MED ORDER — "TUBERCULIN SYRINGE 27G X 1/2"" 1 ML MISC": 12.0000 | 3 refills | Status: DC

## 2020-07-08 MED ORDER — METHOTREXATE SODIUM CHEMO INJECTION 50 MG/2ML: 20.0000 mg | INTRAMUSCULAR | 0 refills | Status: DC

## 2020-07-08 NOTE — Progress Notes (Signed)
After visit, received notification that patient's Rasuvo was $95 per 28 days after copay card.  Family stated they'd be okay with trying vial and syringe and daughters would administer since patient would be unable to do so. Reviewed admin sites. Reviewed vial/syringe volume of 0.36mL that would need to be drawn up every week. I recommended that they speak with pharmacist when they pick up vial/syringe for further instruction on how to administer. [name] and [place] verbalized understanding.  Copay for Humira is $5. Payment information collected over the phone and communicated to [facility]  Chesley Mires, PharmD, MPH Clinical Pharmacist (Rheumatology and Pulmonology)

## 2020-07-09 ENCOUNTER — Other Ambulatory Visit (HOSPITAL_COMMUNITY): Payer: Self-pay

## 2020-07-10 ENCOUNTER — Other Ambulatory Visit (HOSPITAL_COMMUNITY): Payer: Self-pay

## 2020-07-27 ENCOUNTER — Encounter
Payer: BC Managed Care – PPO | Attending: Physical Medicine and Rehabilitation | Admitting: Physical Medicine and Rehabilitation

## 2020-07-27 ENCOUNTER — Encounter: Payer: Self-pay | Admitting: Physical Medicine and Rehabilitation

## 2020-07-27 ENCOUNTER — Telehealth: Payer: Self-pay | Admitting: Rheumatology

## 2020-07-27 ENCOUNTER — Other Ambulatory Visit: Payer: Self-pay

## 2020-07-27 VITALS — BP 149/74 | HR 68 | Temp 97.8°F | Ht 60.0 in | Wt 204.0 lb

## 2020-07-27 DIAGNOSIS — G8929 Other chronic pain: Secondary | ICD-10-CM | POA: Diagnosis not present

## 2020-07-27 DIAGNOSIS — M62838 Other muscle spasm: Secondary | ICD-10-CM | POA: Insufficient documentation

## 2020-07-27 DIAGNOSIS — G894 Chronic pain syndrome: Secondary | ICD-10-CM

## 2020-07-27 DIAGNOSIS — M4726 Other spondylosis with radiculopathy, lumbar region: Secondary | ICD-10-CM | POA: Insufficient documentation

## 2020-07-27 DIAGNOSIS — M5441 Lumbago with sciatica, right side: Secondary | ICD-10-CM | POA: Diagnosis not present

## 2020-07-27 DIAGNOSIS — M5442 Lumbago with sciatica, left side: Secondary | ICD-10-CM | POA: Diagnosis not present

## 2020-07-27 DIAGNOSIS — M5116 Intervertebral disc disorders with radiculopathy, lumbar region: Secondary | ICD-10-CM | POA: Insufficient documentation

## 2020-07-27 MED ORDER — HYDROCODONE-ACETAMINOPHEN 5-325 MG PO TABS: 1.0000 | ORAL_TABLET | Freq: Four times a day (QID) | ORAL | 0 refills | Status: DC | PRN

## 2020-07-27 MED ORDER — CYCLOBENZAPRINE HCL 10 MG PO TABS: 10.0000 mg | ORAL_TABLET | Freq: Two times a day (BID) | ORAL | 5 refills | Status: DC | PRN

## 2020-07-27 NOTE — Telephone Encounter (Signed)
Patient's daughter came in to let you know patient did not like new Resuvo, and would like to go back to the old Resuvo. Please call to discuss.

## 2020-07-27 NOTE — Patient Instructions (Addendum)
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 Here for f/u on chronic back and leg pain.    Will increase Norco to 5/325 mg QID (4x/day) instead of 3x/day-   Don't want to increase actual pill dose, since we want to leave room to go up in the future- but I'm willing to increase AMOUNT of pills to 4x/day from 3x/day.  Con't Humira from Rheumatology- will improve pain more as time passes- so if can reduce dose of Norco back to 3x/day, that would be ideal, but if cannot, it's OK.   4. Flexeril/Cyclobenzaprine-  10 mg 2x/day as needed- can make sleepy- so start at night time- don't take more than 5 days/week max- because body becomes used to it faster than other meds. So take on bad days, but not good days.   5. F/U in 3 months- give me a call if any issues with new muscle relaxant- seriously, call me if any issues- side effects, doesn't work.

## 2020-07-27 NOTE — Progress Notes (Signed)
Subjective:    Patient ID: Connie Ross, female    DOB: 01-16-1955, 65 y.o.   MRN: [id number]  HPI  Pt is a 65 yr old female with RA of multiple sites- now on Humira, chronic Low back pain, HTN, DM, BGs - 140-170s (bad spell 2 weeks ago- was 500s) B/L knee replacement- 2018/2019; No CKD at this time. Here for f/u on chronic back and leg pain.     Taking pain meds 3x/day is going much better.  Before, was getting 1-2x/day so it's MUCH better.   Still has some issues;  Still also hurting in hands/arms- from RA.   Started Humira last month-  Sees an improvement- from Rheumatology.   Asking if can increase dosage.  I explained I don't want to increase due to over time, she's going to want me to increase and need some room to increase.   Not on Muscle spasms meds right now-    Pain Inventory Average Pain 7 Pain Right Now 7 My pain is aching  In the last 24 hours, has pain interfered with the following? General activity 4 Relation with others 5 Enjoyment of life 7 What TIME of day is your pain at its worst? night Sleep (in general) Fair  Pain is worse with: sitting, inactivity, and standing Pain improves with: rest and medication Relief from Meds: 9  Family History  Adopted: Yes  Problem Relation Age of Onset   Cancer Mother        breast   Healthy Son    Healthy Daughter    Healthy Daughter    Social History   Socioeconomic History   Marital status: Married    Spouse name: Not on file   Number of children: Not on file   Years of education: Not on file   Highest education level: Not on file  Occupational History   Not on file  Tobacco Use   Smoking status: Never   Smokeless tobacco: Never  Vaping Use   Vaping Use: Never used  Substance and Sexual Activity   Alcohol use: No   Drug use: No   Sexual activity: Not Currently  Other Topics Concern   Not on file  Social History Narrative   Not on file   Social Determinants of Health    Financial Resource Strain: Not on file  Food Insecurity: Not on file  Transportation Needs: Not on file  Physical Activity: Not on file  Stress: Not on file  Social Connections: Not on file   Past Surgical History:  Procedure Laterality Date   EYE SURGERY     bilateral cataracts   KNEE ARTHROPLASTY     TOTAL KNEE ARTHROPLASTY Right 02/28/2013   TOTAL KNEE ARTHROPLASTY Right 02/27/2013   Procedure: TOTAL KNEE ARTHROPLASTY- knee;  Surgeon: Nadara Mustard, MD;  Location: [facility];  Service: Orthopedics;  Laterality: Right;  Right Total Knee Arthroplasty   TOTAL KNEE ARTHROPLASTY Left 07/04/2014   Procedure: LEFT TOTAL KNEE ARTHROPLASTY;  Surgeon: Nadara Mustard, MD;  Location: [facility];  Service: Orthopedics;  Laterality: Left;   Past Surgical History:  Procedure Laterality Date   EYE SURGERY     bilateral cataracts   KNEE ARTHROPLASTY     TOTAL KNEE ARTHROPLASTY Right 02/28/2013   TOTAL KNEE ARTHROPLASTY Right 02/27/2013   Procedure: TOTAL KNEE ARTHROPLASTY- knee;  Surgeon: Nadara Mustard, MD;  Location: [facility];  Service: Orthopedics;  Laterality: Right;  Right Total Knee Arthroplasty   TOTAL KNEE ARTHROPLASTY  Left 07/04/2014   Procedure: LEFT TOTAL KNEE ARTHROPLASTY;  Surgeon: Nadara Mustard, MD;  Location: [facility];  Service: Orthopedics;  Laterality: Left;   Past Medical History:  Diagnosis Date   Anxiety    takes no meds   DVT (deep venous thrombosis) (HCC)    GERD (gastroesophageal reflux disease)    High cholesterol    Hypertension    Rheumatoid arthritis (HCC)    Type II diabetes mellitus (HCC)    BP (!) 149/74 (BP Location: Right Arm)   Pulse 68   Temp 97.8 F (36.6 C)   Ht 5' (1.524 m)   Wt 204 lb (92.5 kg)   LMP  (LMP Unknown)   SpO2 95%   BMI 39.84 kg/m   Opioid Risk Score:   Fall Risk Score:  `1  Depression screen PHQ 2/9  Depression screen PHQ 2/9 05/25/2020  Decreased Interest 0  Down, Depressed, Hopeless 0  PHQ - 2 Score 0  Altered sleeping 0  Tired, decreased  energy 0  Change in appetite 0  Feeling bad or failure about yourself  0  Trouble concentrating 0  Moving slowly or fidgety/restless 0  Suicidal thoughts 0  PHQ-9 Score 0       Review of Systems  Constitutional: Negative.   HENT: Negative.    Eyes: Negative.   Respiratory: Negative.    Cardiovascular: Negative.   Gastrointestinal: Negative.   Endocrine: Negative.   Genitourinary: Negative.   Musculoskeletal:  Positive for back pain.       Right and left shoulder pain , pain in both arms  Skin: Negative.   Allergic/Immunologic: Negative.   Neurological: Negative.   Psychiatric/Behavioral: Negative.        Objective:   Physical Exam  Awake, alert, appropriate, sitting with arms crossed, daughter, Connie Ross, there as interpretor, NAD Has TTP over R paraspinals and midline lumbar spine Also has associated Muscle spasms/trigger points palpated in same area      Assessment & Plan:   Pt is a 65 yr old female with RA of multiple sites- now on Humira, chronic Low back pain, HTN, DM, BGs - 140-170s (bad spell 2 weeks ago- was 500s) B/L knee replacement- 2018/2019; No CKD at this time. Here for f/u on chronic back and leg pain.    Will increase Norco to 5/325 mg QID (4x/day) instead of 3x/day-   Don't want to increase actual pill dose, since we want to leave room to go up in the future- but I'm willing to increase AMOUNT of pills to 4x/day from 3x/day.  Con't Humira from Rheumatology- will improve pain more as time passes- so if can reduce dose of Norco back to 3x/day, that would be ideal, but if cannot, it's OK.   4. Flexeril/Cyclobenzaprine-  10 mg 2x/day as needed- can make sleepy- so start at night time- don't take more than 5 days/week max- because body becomes used to it faster than other meds. So take on bad days, but not good days.   5. F/U in 3 months  I spent a total of 23 minutes on visit- trying to make clear why not increasing dose and what else we can do to  improve pain.

## 2020-07-27 NOTE — Telephone Encounter (Signed)
ATC patient but phone went to VM box. Left VM requesting daughter return call to discuss.  Rasuvo was switched to MTX vial/syringe d/t cost.  Chesley Mires, PharmD, MPH, BCPS Clinical Pharmacist (Rheumatology and Pulmonology)

## 2020-07-28 NOTE — Telephone Encounter (Signed)
There is grant open for PAF for RA patients. ATC patient to enroll to help with Rasuvo cost. Would use grant money instead of copay card for cost support. Unable to reach. Will ATC tomorrow  Chesley Mires, PharmD, MPH, BCPS Clinical Pharmacist (Rheumatology and Pulmonology)

## 2020-07-28 NOTE — Telephone Encounter (Signed)
Left VM with patient's daughter, [name]. Requested return call regarding Rasuvo. Will ATC again later this week  Chesley Mires, PharmD, MPH, BCPS Clinical Pharmacist (Rheumatology and Pulmonology)

## 2020-07-31 NOTE — Progress Notes (Signed)
Office Visit Note  Patient: Connie Ross             Date of Birth: 08/18/1955           MRN: [id number]             PCP: Iona Beard, MD Referring: Iona Beard, MD Visit Date: 08/14/2020 Occupation: _0 @  Subjective:  Pain in both hands  History of Present Illness: Connie Ross is a 65 y.o. female with history of seropositive rheumatoid arthritis and DDD. She restarted on Humira and rasuvo on 07/07/20.  She is noticing a gradual improvement in joint pain and joint swelling.  She continues to have going pain in both shoulders and tenderness and swelling in both hands. She has ongoing stiffness in the morning and pain in the evenings.  She denies any recent falls.  She uses a cane to assist with ambulation.   She denies any recent infections.    Activities of Daily Living:  Patient reports morning stiffness for 5 hours.   Patient Denies nocturnal pain.  Difficulty dressing/grooming: Reports Difficulty climbing stairs: Denies Difficulty getting out of chair: Denies Difficulty using hands for taps, buttons, cutlery, and/or writing: Reports  Review of Systems  Constitutional:  Negative for fatigue.  HENT:  Negative for mouth sores, mouth dryness and nose dryness.   Eyes:  Negative for pain, visual disturbance and dryness.  Respiratory:  Negative for cough, hemoptysis, shortness of breath and difficulty breathing.   Cardiovascular:  Negative for chest pain, palpitations, hypertension and swelling in legs/feet.  Gastrointestinal:  Negative for blood in stool, constipation and diarrhea.  Endocrine: Negative for excessive thirst and increased urination.  Genitourinary:  Negative for difficulty urinating and painful urination.  Musculoskeletal:  Positive for joint pain, joint pain, joint swelling and morning stiffness. Negative for myalgias, muscle weakness, muscle tenderness and myalgias.  Skin:  Negative for color change, pallor, rash, hair loss, nodules/bumps,  skin tightness, ulcers and sensitivity to sunlight.  Allergic/Immunologic: Negative for susceptible to infections.  Neurological:  Negative for dizziness, numbness, headaches and weakness.  Hematological:  Negative for bruising/bleeding tendency and swollen glands.  Psychiatric/Behavioral:  Negative for depressed mood and sleep disturbance. The patient is not nervous/anxious.    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 05/25/2020   Chronic bilateral low back pain with bilateral sciatica 05/25/2020   Pain in right foot 04/06/2016   Rheumatoid arthritis of multiple sites with negative rheumatoid factor ([place]) 02/03/2016   High risk medication use 02/03/2016   Osteoarthritis of lumbar spine 02/03/2016   Essential hypertension 02/03/2016   History of diabetes mellitus 02/03/2016   History of diabetic neuropathy 02/03/2016   Dyslipidemia 02/03/2016   Vitamin D deficiency 02/03/2016   Abnormality of gait 03/15/2013   Right knee pain 03/15/2013   History of total knee replacement, bilateral 02/27/2013    Past Medical History:  Diagnosis Date   Anxiety    takes no meds   DVT (deep venous thrombosis) (HCC)    GERD (gastroesophageal reflux disease)    High cholesterol    Hypertension    Rheumatoid arthritis ([place])    Type II diabetes mellitus ([place])     Family History  Adopted: Yes  Problem Relation Age of Onset   Cancer Mother        breast  Healthy Son    Healthy Daughter    Healthy Daughter    Past Surgical History:  Procedure Laterality Date   EYE SURGERY     bilateral cataracts   KNEE ARTHROPLASTY     TOTAL KNEE ARTHROPLASTY Right 02/28/2013   TOTAL KNEE ARTHROPLASTY Right 02/27/2013   Procedure: TOTAL KNEE ARTHROPLASTY- knee;  Surgeon: Newt Minion, MD;  Location: [facility];  Service:  Orthopedics;  Laterality: Right;  Right Total Knee Arthroplasty   TOTAL KNEE ARTHROPLASTY Left 07/04/2014   Procedure: LEFT TOTAL KNEE ARTHROPLASTY;  Surgeon: Newt Minion, MD;  Location: [facility];  Service: Orthopedics;  Laterality: Left;   Social History   Social History Narrative   Not on file   Immunization History  Administered Date(s) Administered   Influenza-Unspecified 09/03/2012   Moderna Sars-Covid-2 Vaccination 07/25/2019, 08/22/2019   Pneumococcal Polysaccharide-23 03/01/2013     Objective: Vital Signs: BP (!) 152/73 (BP Location: Left Arm, Patient Position: Sitting, Cuff Size: Normal)   Pulse 82   Resp 15   Ht 5' 2" (1.575 m)   Wt 206 lb (93.4 kg)   LMP  (LMP Unknown)   BMI 37.68 kg/m    Physical Exam Vitals and nursing note reviewed.  Constitutional:      Appearance: She is well-developed.  HENT:     Head: Normocephalic and atraumatic.  Eyes:     Conjunctiva/sclera: Conjunctivae normal.  Pulmonary:     Effort: Pulmonary effort is normal.  Abdominal:     Palpations: Abdomen is soft.  Musculoskeletal:     Cervical back: Normal range of motion.  Skin:    General: Skin is warm and dry.     Capillary Refill: Capillary refill takes less than 2 seconds.  Neurological:     Mental Status: She is alert and oriented to person, place, and time.  Psychiatric:        Behavior: Behavior normal.     Musculoskeletal Exam: C-spine has good ROM with no discomfort.  Painful ROM of both shoulder joints.  Elbow joint flexion contractures noted.  Tenderness over both elbow joints.  Limited ROM of both wrist joints.  Tenderness over both wrists.  Synovitis of the right wrist.  Tenderness over MCP and PIP joints as described below.  PIP and DIP thickening.  Bilateral knee replacements have good ROM.  Some warmth in the left knee.  Tenderness over both ankle joints.  Warmth of the left ankle.    CDAI Exam: CDAI Score: -- Patient Global: --; Provider Global: -- Swollen: 20 ;  Tender: 25  Joint Exam 08/14/2020      Right  Left  Glenohumeral   Tender   Tender  Elbow   Tender   Tender  Wrist  Swollen Tender   Tender  MCP 1  Swollen Tender     MCP 2  Swollen Tender  Swollen Tender  MCP 3  Swollen Tender  Swollen Tender  MCP 4     Swollen Tender  MCP 5  Swollen Tender  Swollen Tender  IP  Swollen   Swollen Tender  PIP 2  Swollen Tender  Swollen Tender  PIP 3  Swollen Tender  Swollen Tender  PIP 4  Swollen Tender  Swollen Tender  PIP 5  Swollen Tender  Swollen Tender  Ankle   Tender  Swollen Tender     Investigation: No additional findings.  Imaging: No results found.  Recent Labs: Lab Results  Component Value Date   WBC 8.9 08/12/2020  HGB 10.1 (L) 08/12/2020   PLT 319 08/12/2020   NA 139 08/12/2020   K 4.5 08/12/2020   CL 104 08/12/2020   CO2 26 08/12/2020   GLUCOSE 208 (H) 08/12/2020   BUN 22 08/12/2020   CREATININE 0.59 08/12/2020   BILITOT 0.3 08/12/2020   ALKPHOS 102 06/20/2016   AST 11 08/12/2020   ALT 11 08/12/2020   PROT 7.0 08/12/2020   ALBUMIN 3.7 06/20/2016   CALCIUM 9.1 08/12/2020   GFRAA 108 06/29/2020   QFTBGOLDPLUS NEGATIVE 02/10/2020    Speciality Comments: No specialty comments available.  Procedures:  No procedures performed Allergies: Patient has no known allergies.   Assessment / Plan:     Visit Diagnoses: Rheumatoid arthritis of multiple sites with negative rheumatoid factor (HCC) - RF negative, CCP negative, elevated ESR, positive ANA, positive Ro: She has ongoing tenderness and synovitis in multiple MCP and PIP joints as described above.  She is experiencing difficulty making a complete fist due to the severity of pain and stiffness.  She has ongoing pain in both shoulder and both elbow joints as well.  Right elbow joint contracture is unchanged.  She has gradually started to notice improvement in her joint pain and stiffness since restarting on Rasuvo and Humira on 07/07/2020.  She is injecting Humira 40 mg  subcutaneously every 2 weeks and Rasuvo 20 mg subcutaneous injections once weekly.  She requested to receive a sample today and brought paperwork with her to finalize patient assistance.  She requested a prednisone taper which was sent to the pharmacy.  Due to her history of type 2 diabetes a low-dose short prednisone taper was prescribed starting at 10 mg tapering by 2.5 mg every 4 days.  She was advised to monitor her blood glucose closely and to notify her PCP if her glucose is above 200.  She will remain on Humira and Rasuvo as prescribed.  She was advised to notify us if her discomfort persists or worsens.  She will follow-up in the office in 2 to 3 months to assess her response to combination therapy.  High risk medication use -Rasuvo 20 mg subcutaneous injections once weekly, folic acid 2 mg by mouth daily, Humira 40 mg subcutaneous injections every 14 days.  Both medications were restarted on 07/07/2020.  Humira d/c in Dec 2021 due to clinically doing well.  CBC and CMP were drawn on 08/12/2020.  She will be due to update lab work in November and every 3 months to monitor for drug toxicity. TB Gold negative on 02/10/2020 and will continue to be monitored yearly. She has not had any recent infections.  Discussed the importance of holding Rasuvo and Humira if she develops signs or symptoms of an infection and to resume once the infection is completely cleared.  History of total knee replacement, bilateral: Doing well.  Good range of motion with no discomfort at this time.  DDD (degenerative disc disease), lumbar: No midline spinal tenderness at this time.  Other medical conditions are listed as follows:   History of hypertension  Dyslipidemia  History of diabetes mellitus: Advised to monitor blood glucose level closely while taking the low-dose short prednisone taper.  History of diabetic neuropathy  History of vitamin D deficiency  Orders: No orders of the defined types were placed in this  encounter.  Meds ordered this encounter  Medications   predniSONE (DELTASONE) 5 MG tablet    Sig: Take 2 tablets by mouth daily x4 days, 1.5 tablets daily x4 days, 1  tablet daily x4 days, half tablet daily x4 days.    Dispense:  20 tablet    Refill:  0      Follow-Up Instructions: Return in about 2 months (around 10/14/2020) for Rheumatoid arthritis, DDD.   Ofilia Neas, PA-C  Note - This record has been created using Dragon software.  Chart creation errors have been sought, but may not always  have been located. Such creation errors do not reflect on  the standard of medical care.

## 2020-08-03 NOTE — Telephone Encounter (Signed)
Still unable to reach, will continue to f/u

## 2020-08-04 ENCOUNTER — Other Ambulatory Visit (HOSPITAL_COMMUNITY): Payer: Self-pay

## 2020-08-07 ENCOUNTER — Telehealth: Payer: Self-pay

## 2020-08-07 ENCOUNTER — Other Ambulatory Visit (HOSPITAL_COMMUNITY): Payer: Self-pay

## 2020-08-07 DIAGNOSIS — Z79899 Other long term (current) drug therapy: Secondary | ICD-10-CM

## 2020-08-07 NOTE — Telephone Encounter (Signed)
Lab Orders released.  

## 2020-08-07 NOTE — Telephone Encounter (Signed)
Patient's daughter [name] left a voicemail requesting labwork orders sent to [facility] in [place].

## 2020-08-11 NOTE — Telephone Encounter (Signed)
Left VM requesting family bring income documents to clinic visit on 08/14/20 as patient can submit for patient assistance for Rasuvo.   Chesley Mires, PharmD, MPH, BCPS Clinical Pharmacist (Rheumatology and Pulmonology)

## 2020-08-12 DIAGNOSIS — Z79899 Other long term (current) drug therapy: Secondary | ICD-10-CM | POA: Diagnosis not present

## 2020-08-13 LAB — CBC WITH DIFFERENTIAL/PLATELET
Absolute Monocytes: 579 cells/uL (ref 200–950)
Basophils Absolute: 53 cells/uL (ref 0–200)
Basophils Relative: 0.6 %
Eosinophils Absolute: 1077 cells/uL — ABNORMAL HIGH (ref 15–500)
Eosinophils Relative: 12.1 %
HCT: 31.6 % — ABNORMAL LOW (ref 35.0–45.0)
Hemoglobin: 10.1 g/dL — ABNORMAL LOW (ref 11.7–15.5)
Lymphs Abs: 1388 cells/uL (ref 850–3900)
MCH: 29.6 pg (ref 27.0–33.0)
MCHC: 32 g/dL (ref 32.0–36.0)
MCV: 92.7 fL (ref 80.0–100.0)
MPV: 9.6 fL (ref 7.5–12.5)
Monocytes Relative: 6.5 %
Neutro Abs: 5803 cells/uL (ref 1500–7800)
Neutrophils Relative %: 65.2 %
Platelets: 319 10*3/uL (ref 140–400)
RBC: 3.41 10*6/uL — ABNORMAL LOW (ref 3.80–5.10)
RDW: 14.7 % (ref 11.0–15.0)
Total Lymphocyte: 15.6 %
WBC: 8.9 10*3/uL (ref 3.8–10.8)

## 2020-08-13 LAB — COMPLETE METABOLIC PANEL WITH GFR
AG Ratio: 1.1 (calc) (ref 1.0–2.5)
ALT: 11 U/L (ref 6–29)
AST: 11 U/L (ref 10–35)
Albumin: 3.6 g/dL (ref 3.6–5.1)
Alkaline phosphatase (APISO): 81 U/L (ref 37–153)
BUN: 22 mg/dL (ref 7–25)
CO2: 26 mmol/L (ref 20–32)
Calcium: 9.1 mg/dL (ref 8.6–10.4)
Chloride: 104 mmol/L (ref 98–110)
Creat: 0.59 mg/dL (ref 0.50–1.05)
Globulin: 3.4 g/dL (calc) (ref 1.9–3.7)
Glucose, Bld: 208 mg/dL — ABNORMAL HIGH (ref 65–99)
Potassium: 4.5 mmol/L (ref 3.5–5.3)
Sodium: 139 mmol/L (ref 135–146)
Total Bilirubin: 0.3 mg/dL (ref 0.2–1.2)
Total Protein: 7 g/dL (ref 6.1–8.1)
eGFR: 100 mL/min/{1.73_m2} (ref >=60)

## 2020-08-13 NOTE — Progress Notes (Signed)
Anemia is stable.  Glucose is elevated.  Please forward labs to her PCP.

## 2020-08-14 ENCOUNTER — Other Ambulatory Visit (HOSPITAL_COMMUNITY): Payer: Self-pay

## 2020-08-14 ENCOUNTER — Encounter: Payer: Self-pay | Admitting: Physician Assistant

## 2020-08-14 ENCOUNTER — Other Ambulatory Visit: Payer: Self-pay

## 2020-08-14 ENCOUNTER — Ambulatory Visit (INDEPENDENT_AMBULATORY_CARE_PROVIDER_SITE_OTHER): Payer: BC Managed Care – PPO | Admitting: Physician Assistant

## 2020-08-14 VITALS — BP 152/73 | HR 82 | Resp 15 | Ht 62.0 in | Wt 206.0 lb

## 2020-08-14 DIAGNOSIS — Z79899 Other long term (current) drug therapy: Secondary | ICD-10-CM | POA: Diagnosis not present

## 2020-08-14 DIAGNOSIS — M51369 Other intervertebral disc degeneration, lumbar region without mention of lumbar back pain or lower extremity pain: Secondary | ICD-10-CM

## 2020-08-14 DIAGNOSIS — Z8679 Personal history of other diseases of the circulatory system: Secondary | ICD-10-CM

## 2020-08-14 DIAGNOSIS — E785 Hyperlipidemia, unspecified: Secondary | ICD-10-CM

## 2020-08-14 DIAGNOSIS — Z96653 Presence of artificial knee joint, bilateral: Secondary | ICD-10-CM

## 2020-08-14 DIAGNOSIS — Z8639 Personal history of other endocrine, nutritional and metabolic disease: Secondary | ICD-10-CM | POA: Diagnosis not present

## 2020-08-14 DIAGNOSIS — M5136 Other intervertebral disc degeneration, lumbar region: Secondary | ICD-10-CM

## 2020-08-14 DIAGNOSIS — M0609 Rheumatoid arthritis without rheumatoid factor, multiple sites: Secondary | ICD-10-CM

## 2020-08-14 MED ORDER — PREDNISONE 5 MG PO TABS: ORAL_TABLET | ORAL | 0 refills | Status: DC

## 2020-08-14 NOTE — Progress Notes (Signed)
Medication Samples have been provided to the patient.  Drug name: Rasuvo       Strength: 20mg         Qty: 1  LOT AB  Exp.Date: 09/2020   Dosing instructions: Inject 20mg  into the skin once weekly.

## 2020-08-14 NOTE — Patient Instructions (Signed)
Standing Labs We placed an order today for your standing lab work.   Please have your standing labs drawn in November and every 3 months  If possible, please have your labs drawn 2 weeks prior to your appointment so that the provider can discuss your results at your appointment.  Please note that you may see your imaging and lab results in MyChart before we have reviewed them. We may be awaiting multiple results to interpret others before contacting you. Please allow our office up to 72 hours to thoroughly review all of the results before contacting the office for clarification of your results.  We have open lab daily: Monday through Thursday from 1:30-4:30 PM and Friday from 1:30-4:00 PM at the office of Dr. Shaili Deveshwar, [facility].   Please be advised, all patients with office appointments requiring lab work will take precedent over walk-in lab work.  If possible, please come for your lab work on Monday and Friday afternoons, as you may experience shorter wait times. The office is located at [address], [address], [place], [place] [address] No appointment is necessary.   Labs are drawn by [facility]. Please bring your co-pay at the time of your lab draw.  You may receive a bill from [facility] for your lab work.  If you wish to have your labs drawn at another location, please call the office 24 hours in advance to send orders.  If you have any questions regarding directions or hours of operation,  please call [phone].   As a reminder, please drink plenty of water prior to coming for your lab work. Thanks!  

## 2020-08-14 NOTE — Telephone Encounter (Signed)
Patient and daughter came to clinic visit today with income documents to be submitted with Rasuvo patient assistance application. Patient signed and completed patient assistance application for Rasuvo at [facility] today.  Chesley Mires, PharmD, MPH, BCPS Clinical Pharmacist (Rheumatology and Pulmonology)

## 2020-08-17 ENCOUNTER — Ambulatory Visit: Payer: BC Managed Care – PPO | Admitting: Rheumatology

## 2020-08-18 NOTE — Telephone Encounter (Addendum)
Called patient with interpreter # [phone]. Unable to reach and phone went straight to left VM detailing that patient was denied application and we can send rx for Rasuvo but copay will be $95 with copay card  Patient was denied because her income exceeds the threshold which is less than or equal to 250% of [employer]. Patient is at 337% and is not eligible to appeal for approval.  Will f/u  Chesley Mires, PharmD, MPH, BCPS Clinical Pharmacist (Rheumatology and Pulmonology)

## 2020-08-18 NOTE — Telephone Encounter (Signed)
Submitted Patient Assistance Application to [employer] for RASUVO along with provider portion, signed patient portion, PA, med list, and insurance card copy, and income documents. Will update patient when we receive a response.  Fax# [phone] Phone# [phone]  Chesley Mires, PharmD, MPH, BCPS Clinical Pharmacist (Rheumatology and Pulmonology)

## 2020-08-18 NOTE — Telephone Encounter (Signed)
Received fax from [employer] confirming receipt of application.  Case ID: [id number]

## 2020-08-27 NOTE — Telephone Encounter (Signed)
Left VM for Connie Ross regarding Rasuvo . Unable to reach and requested return call with direct rheum phone number and hours that I'll be on site  Chesley Mires, PharmD, MPH, BCPS Clinical Pharmacist (Rheumatology and Pulmonology)

## 2020-09-03 ENCOUNTER — Telehealth: Payer: Self-pay | Admitting: *Deleted

## 2020-09-03 MED ORDER — HYDROCODONE-ACETAMINOPHEN 5-325 MG PO TABS: 1.0000 | ORAL_TABLET | Freq: Four times a day (QID) | ORAL | 0 refills | Status: DC | PRN

## 2020-09-03 NOTE — Telephone Encounter (Signed)
[name], daughter of Mrs Jobst called for a refill on her hydrocodone.  Her last fill date was 08/03/20 and her next appt is 10/30/20.

## 2020-09-08 ENCOUNTER — Other Ambulatory Visit (HOSPITAL_COMMUNITY): Payer: Self-pay

## 2020-09-10 ENCOUNTER — Other Ambulatory Visit (HOSPITAL_COMMUNITY): Payer: Self-pay

## 2020-09-16 ENCOUNTER — Other Ambulatory Visit (HOSPITAL_COMMUNITY): Payer: Self-pay

## 2020-09-16 ENCOUNTER — Telehealth: Payer: Self-pay | Admitting: Pharmacist

## 2020-09-16 DIAGNOSIS — M0609 Rheumatoid arthritis without rheumatoid factor, multiple sites: Secondary | ICD-10-CM

## 2020-09-16 MED ORDER — RASUVO 20 MG/0.4ML ~~LOC~~ SOAJ
20.0000 mg | SUBCUTANEOUS | 0 refills | Status: DC
  Filled 2020-09-16: qty 4.8, 84d supply, fill #0

## 2020-09-16 MED ORDER — RASUVO 20 MG/0.4ML ~~LOC~~ SOAJ
20.0000 mg | SUBCUTANEOUS | 0 refills | Status: DC
  Filled 2020-09-16: qty 1.6, 28d supply, fill #0
  Filled 2020-09-16: qty 3.2, fill #0
  Filled 2020-10-15: qty 1.6, 28d supply, fill #1

## 2020-09-16 NOTE — Telephone Encounter (Signed)
Resent Rasuvo rx for 8 pens (3.2 ml) to [facility]. Previous rx incorrectly sent for 8 ML

## 2020-09-16 NOTE — Addendum Note (Signed)
Addended by: Murrell Redden on: 09/16/2020 04:29 PM   Modules accepted: Orders

## 2020-09-16 NOTE — Telephone Encounter (Addendum)
Spoke with patient's daughter Hulen Shouts who is requesting that patient switch back to Rasuvo. They had difficulty with vial/syringe.  They are amenable to paying copay and were denied patient assistance. They state that they have been injecting using leftover supply of Rasuvo (unclear when /where they received this from)  Will send to [facility] with copay card information - Rasuvo 20mg  once weekly - 8 pens (8 week supply only)  Patient due for CBC and CMP update in 2 months.  , PharmD, MPH, BCPS Clinical Pharmacist (Rheumatology and Pulmonology)

## 2020-09-17 ENCOUNTER — Other Ambulatory Visit (HOSPITAL_COMMUNITY): Payer: Self-pay

## 2020-09-22 ENCOUNTER — Other Ambulatory Visit (HOSPITAL_COMMUNITY): Payer: Self-pay

## 2020-10-01 ENCOUNTER — Other Ambulatory Visit: Payer: Self-pay | Admitting: Rheumatology

## 2020-10-01 DIAGNOSIS — M0609 Rheumatoid arthritis without rheumatoid factor, multiple sites: Secondary | ICD-10-CM

## 2020-10-02 ENCOUNTER — Telehealth: Payer: Self-pay | Admitting: Physical Medicine and Rehabilitation

## 2020-10-02 NOTE — Telephone Encounter (Signed)
Patient needs a refill on Hydrocodone.  Please call [name] with any questions [phone].

## 2020-10-05 MED ORDER — HYDROCODONE-ACETAMINOPHEN 5-325 MG PO TABS: 1.0000 | ORAL_TABLET | Freq: Four times a day (QID) | ORAL | 0 refills | Status: DC | PRN

## 2020-10-06 NOTE — Progress Notes (Signed)
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   History of Present Illness: Connie Ross is a 65 y.o. female with a history of seropositive rheumatoid arthritis.  She had a recent flare with the increased joint pain and joint swelling due to spacing Humira every month.  She was recently evaluated by Lovena Le in August and her Humira was spaced again at every other week interval.  She was also switched to Rasuvo.  She is accompanied by her daughter today who states that her symptoms are gradually getting better.  Although she still continues to have some swelling in her hands.  She is requesting another prednisone taper until the Humira gets into her system.  They have been monitoring her blood sugar which is status stable per her daughter.  She also has some discomfort in her right shoulder joint.  Activities of Daily Living:  Patient reports morning stiffness for several hours.   Patient Reports nocturnal pain.  Difficulty dressing/grooming: Reports Difficulty climbing stairs: Reports Difficulty getting out of chair: Reports Difficulty using hands for taps, buttons, cutlery, and/or writing: Reports  Review of Systems  Constitutional:  Positive for fatigue.  HENT:  Negative for mouth sores, mouth dryness and nose dryness.   Eyes:  Negative for pain, itching and dryness.  Respiratory:  Negative for shortness of breath and difficulty breathing.   Cardiovascular:  Negative for chest pain and palpitations.  Gastrointestinal:  Negative for blood in stool, constipation and diarrhea.  Endocrine: Negative for increased urination.  Genitourinary:  Negative for difficulty urinating.  Musculoskeletal:  Positive for joint pain, joint pain,  joint swelling and morning stiffness. Negative for myalgias, muscle tenderness and myalgias.  Skin:  Negative for color change, rash and redness.  Allergic/Immunologic: Negative for susceptible to infections.  Neurological:  Negative for dizziness, numbness, headaches, memory loss and weakness.  Hematological:  Negative for bruising/bleeding tendency.  Psychiatric/Behavioral:  Negative for confusion.    PMFS History:  Patient Active Problem List   Diagnosis Date Noted  . Muscle spasms of both lower extremities 07/27/2020  . Chronic pain syndrome 05/25/2020  . Radiculopathy due to disorder of intervertebral disc of lumbar spine 05/25/2020  . Chronic bilateral low back pain with bilateral sciatica 05/25/2020  . Pain in right foot 04/06/2016  . Rheumatoid arthritis of multiple sites with negative rheumatoid factor ([place]) 02/03/2016  . High risk medication use 02/03/2016  . 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 Essential hypertension 02/03/2016  . History of diabetes mellitus 02/03/2016  . History of diabetic neuropathy 02/03/2016  . Dyslipidemia 02/03/2016  . Vitamin D deficiency 02/03/2016  . Abnormality of gait 03/15/2013  . Right knee pain 03/15/2013  . History of total knee replacement, bilateral 02/27/2013    Past Medical History:  Diagnosis Date  . Anxiety    takes no meds  . DVT (deep venous thrombosis) ([place])   . GERD (gastroesophageal reflux disease)   . High cholesterol   . Hypertension   . Rheumatoid arthritis ([place])   . Type II diabetes mellitus ()     Family History  Adopted: Yes  Problem Relation Age of Onset  .  Cancer Mother        breast  . Healthy Son   . Healthy Daughter   . Healthy Daughter    Past Surgical History:  Procedure Laterality Date  . EYE SURGERY     bilateral cataracts  . KNEE ARTHROPLASTY    . TOTAL KNEE ARTHROPLASTY Right 02/28/2013  . TOTAL KNEE ARTHROPLASTY Right 02/27/2013   Procedure: TOTAL KNEE ARTHROPLASTY- knee;  Surgeon:  Newt Minion, MD;  Location: [facility];  Service: Orthopedics;  Laterality: Right;  Right Total Knee Arthroplasty  . TOTAL KNEE ARTHROPLASTY Left 07/04/2014   Procedure: LEFT TOTAL KNEE ARTHROPLASTY;  Surgeon: Newt Minion, MD;  Location: [facility];  Service: Orthopedics;  Laterality: Left;   Social History   Social History Narrative  . Not on file   Immunization History  Administered Date(s) Administered  . Influenza-Unspecified 09/03/2012  . Moderna Sars-Covid-2 Vaccination 07/25/2019, 08/22/2019  . Pneumococcal Polysaccharide-23 03/01/2013     Objective: Vital Signs: BP (!) 147/74 (BP Location: Right Arm, Patient Position: Sitting, Cuff Size: Normal)   Pulse 65   Ht _0  (1.549 m)   Wt 215 lb (97.5 kg)   LMP  (LMP Unknown)   BMI 40.62 kg/m    Physical Exam Vitals and nursing note reviewed.  Constitutional:      Appearance: She is well-developed.  HENT:     Head: Normocephalic and atraumatic.  Eyes:     Conjunctiva/sclera: Conjunctivae normal.  Cardiovascular:     Rate and Rhythm: Normal rate and regular rhythm.     Heart sounds: Normal heart sounds.  Pulmonary:     Effort: Pulmonary effort is normal.     Breath sounds: Normal breath sounds.  Abdominal:     General: Bowel sounds are normal.     Palpations: Abdomen is soft.  Musculoskeletal:     Cervical back: Normal range of motion.  Lymphadenopathy:     Cervical: No cervical adenopathy.  Skin:    General: Skin is warm and dry.     Capillary Refill: Capillary refill takes less than 2 seconds.  Neurological:     Mental Status: She is alert and oriented to person, place, and time.  Psychiatric:        Behavior: Behavior normal.     Musculoskeletal Exam: C-spine was in good range of motion.  She had painful range of motion of her right shoulder joint.  Palpations with good range of motion.  She has synovitis of her bilateral MCP joints and PIP joints.  Hip joints were difficult to assess in the sitting position.  Knee  joints were in good range of motion.  There was no tenderness over ankles or MTPs.  CDAI Exam: CDAI Score: 16.3  Patient Global: 7 mm; Provider Global: 6 mm Swollen: 6 ; Tender: 9  Joint Exam 10/20/2020      Right  Left  Glenohumeral   Tender     MCP 2   Tender   Tender  PIP 2  Swollen Tender  Swollen Tender  PIP 3  Swollen Tender  Swollen Tender  PIP 4  Swollen Tender  Swollen Tender     Investigation: No additional findings.  Imaging: No results found.  Recent Labs: Lab Results  Component Value Date   WBC 8.9 08/12/2020   HGB 10.1 (L) 08/12/2020   PLT 319 08/12/2020   NA 139 08/12/2020   K 4.5 08/12/2020   CL 104 08/12/2020   CO2 26 08/12/2020   GLUCOSE 208 (  H) 08/12/2020   BUN 22 08/12/2020   CREATININE 0.59 08/12/2020   BILITOT 0.3 08/12/2020   ALKPHOS 102 06/20/2016   AST 11 08/12/2020   ALT 11 08/12/2020   PROT 7.0 08/12/2020   ALBUMIN 3.7 06/20/2016   CALCIUM 9.1 08/12/2020   GFRAA 108 06/29/2020   QFTBGOLDPLUS NEGATIVE 02/10/2020    Speciality Comments: No specialty comments available.  Procedures:  No procedures performed Allergies: Patient has no known allergies.   Assessment / Plan:     Visit Diagnoses: Rheumatoid arthritis of multiple sites with negative rheumatoid factor (HCC) - RF negative, CCP negative, elevated ESR, positive ANA, positive Ro: She had a flare of rheumatoid arthritis after spacing Humira once a month.  She resumed taking Humira every other week after the last visit and also start Rasuvo on a regular basis.  She was given a prednisone taper which she recently finished.  She states she is much better but she still needs more time for Humira to work.  She monitored her blood sugar well she was on prednisone and had no problems.  Her daughter requested a prednisone taper.  I will give her a prednisone taper starting at 10 mg and taper by 2.5 mg every 4 days.  High risk medication use - Rasuvo 20 mg subcutaneous injections once weekly,  folic acid 2 mg by mouth daily, Humira 40 mg subcutaneous injections every 14 days.  Labs obtained on August 12, 2020 showed anemia with hemoglobin of 10.1 otherwise labs were unremarkable.  TB gold was negative on February 10, 2020.  She is been advised to come for lab work in November and every 3 months.  She was also advised to stop Humira and methotrexate in case she develops an infection and resume after infection resolves.  History of total knee replacement, bilateral-she had good range of motion without discomfort.  DDD (degenerative disc disease), lumbar-she continues to have some lower back pain.  Dyslipidemia-increased risk of heart disease with rheumatoid arthritis was discussed.  Dietary modifications and exercise was emphasized.  History of diabetic neuropathy  History of diabetes mellitus-she is been advised to monitor blood glucose closely while she is on prednisone.  History of hypertension-blood pressure was mildly elevated.  History of vitamin D deficiency-use of vitamin D supplement was discussed.  Language barrier-her daughter is the interpreter.  Orders: No orders of the defined types were placed in this encounter.  Meds ordered this encounter  Medications  . predniSONE (DELTASONE) 5 MG tablet    Sig: Take 2 tablets by mouth daily x4 days--1 and 1/2 tablets daily x4 days-- 1 tablet daily x4 days-- 1/2 tablet daily x4 days.    Dispense:  20 tablet    Refill:  0    Follow-Up Instructions: Return in about 3 months (around 01/20/2021) for Rheumatoid arthritis.   Bo Merino, MD  Note - This record has been created using Editor, commissioning.  Chart creation errors have been sought, but may not always  have been located. Such creation errors do not reflect on  the standard of medical care.

## 2020-10-08 ENCOUNTER — Other Ambulatory Visit (HOSPITAL_COMMUNITY): Payer: Self-pay

## 2020-10-14 ENCOUNTER — Other Ambulatory Visit (HOSPITAL_COMMUNITY): Payer: Self-pay

## 2020-10-15 ENCOUNTER — Other Ambulatory Visit: Payer: Self-pay | Admitting: Rheumatology

## 2020-10-15 ENCOUNTER — Other Ambulatory Visit (HOSPITAL_COMMUNITY): Payer: Self-pay

## 2020-10-15 DIAGNOSIS — M0609 Rheumatoid arthritis without rheumatoid factor, multiple sites: Secondary | ICD-10-CM

## 2020-10-15 MED ORDER — HUMIRA (2 PEN) 40 MG/0.4ML ~~LOC~~ AJKT
40.0000 mg | AUTO-INJECTOR | SUBCUTANEOUS | 0 refills | Status: DC
Start: 2020-10-15 — End: 2021-01-11
  Filled 2020-10-15: qty 2, 28d supply, fill #0
  Filled 2020-11-16: qty 2, 28d supply, fill #1
  Filled 2020-12-14: qty 2, 28d supply, fill #2

## 2020-10-15 NOTE — Telephone Encounter (Signed)
Next Visit: 10/20/2020  Last Visit: 08/14/2020  Last Fill: 07/07/2020  DX: Rheumatoid arthritis of multiple sites with negative rheumatoid factor   Current Dose per office note 08/14/2020: Humira 40 mg subcutaneous injections every 14 days.   Labs: 08/12/2020, Anemia is stable.  Glucose is elevated.  Please forward labs to her PCP.  TB Gold: 02/10/2020   Okay to refill Humira?

## 2020-10-19 ENCOUNTER — Other Ambulatory Visit (HOSPITAL_COMMUNITY): Payer: Self-pay

## 2020-10-20 ENCOUNTER — Ambulatory Visit (INDEPENDENT_AMBULATORY_CARE_PROVIDER_SITE_OTHER): Payer: BC Managed Care – PPO | Admitting: Rheumatology

## 2020-10-20 ENCOUNTER — Other Ambulatory Visit (HOSPITAL_COMMUNITY): Payer: Self-pay

## 2020-10-20 ENCOUNTER — Encounter: Payer: Self-pay | Admitting: Rheumatology

## 2020-10-20 ENCOUNTER — Other Ambulatory Visit: Payer: Self-pay

## 2020-10-20 VITALS — BP 147/74 | HR 65 | Ht 61.0 in | Wt 215.0 lb

## 2020-10-20 DIAGNOSIS — Z8679 Personal history of other diseases of the circulatory system: Secondary | ICD-10-CM

## 2020-10-20 DIAGNOSIS — Z79899 Other long term (current) drug therapy: Secondary | ICD-10-CM

## 2020-10-20 DIAGNOSIS — E785 Hyperlipidemia, unspecified: Secondary | ICD-10-CM | POA: Diagnosis not present

## 2020-10-20 DIAGNOSIS — M0609 Rheumatoid arthritis without rheumatoid factor, multiple sites: Secondary | ICD-10-CM | POA: Diagnosis not present

## 2020-10-20 DIAGNOSIS — Z8639 Personal history of other endocrine, nutritional and metabolic disease: Secondary | ICD-10-CM

## 2020-10-20 DIAGNOSIS — M5136 Other intervertebral disc degeneration, lumbar region: Secondary | ICD-10-CM | POA: Diagnosis not present

## 2020-10-20 DIAGNOSIS — Z96653 Presence of artificial knee joint, bilateral: Secondary | ICD-10-CM

## 2020-10-20 DIAGNOSIS — Z789 Other specified health status: Secondary | ICD-10-CM

## 2020-10-20 MED ORDER — PREDNISONE 5 MG PO TABS
ORAL_TABLET | ORAL | 0 refills | Status: DC
  Filled 2020-10-20: qty 20, 16d supply, fill #0

## 2020-10-20 NOTE — Patient Instructions (Signed)
Standing Labs We placed an order today for your standing lab work.   Please have your standing labs drawn in November and every 3 months  If possible, please have your labs drawn 2 weeks prior to your appointment so that the provider can discuss your results at your appointment.  Please note that you may see your imaging and lab results in MyChart before we have reviewed them. We may be awaiting multiple results to interpret others before contacting you. Please allow our office up to 72 hours to thoroughly review all of the results before contacting the office for clarification of your results.  We have open lab daily: Monday through Thursday from 1:30-4:30 PM and Friday from 1:30-4:00 PM at the office of Dr. Elainna Eshleman, [facility].   Please be advised, all patients with office appointments requiring lab work will take precedent over walk-in lab work.  If possible, please come for your lab work on Monday and Friday afternoons, as you may experience shorter wait times. The office is located at [address], [address], [place], [place] [address] No appointment is necessary.   Labs are drawn by [facility]. Please bring your co-pay at the time of your lab draw.  You may receive a bill from [facility] for your lab work.  If you wish to have your labs drawn at another location, please call the office 24 hours in advance to send orders.  If you have any questions regarding directions or hours of operation,  please call [phone].   As a reminder, please drink plenty of water prior to coming for your lab work. Thanks!  

## 2020-10-30 ENCOUNTER — Encounter
Payer: BC Managed Care – PPO | Attending: Physical Medicine and Rehabilitation | Admitting: Physical Medicine and Rehabilitation

## 2020-10-30 ENCOUNTER — Encounter: Payer: Self-pay | Admitting: Physical Medicine and Rehabilitation

## 2020-10-30 ENCOUNTER — Other Ambulatory Visit: Payer: Self-pay

## 2020-10-30 VITALS — BP 149/83 | HR 72 | Ht 61.0 in | Wt 212.0 lb

## 2020-10-30 DIAGNOSIS — Z5181 Encounter for therapeutic drug level monitoring: Secondary | ICD-10-CM

## 2020-10-30 DIAGNOSIS — G894 Chronic pain syndrome: Secondary | ICD-10-CM

## 2020-10-30 DIAGNOSIS — Z79891 Long term (current) use of opiate analgesic: Secondary | ICD-10-CM

## 2020-10-30 MED ORDER — HYDROCODONE-ACETAMINOPHEN 5-325 MG PO TABS: 1.0000 | ORAL_TABLET | Freq: Four times a day (QID) | ORAL | 0 refills | Status: DC | PRN

## 2020-10-30 NOTE — Patient Instructions (Signed)
  Pt is a 65 yr old female with RA  on Humira, chronic low back pain, HTN, DM- B/L knee replacement here for f/u on chronic pain.    Will refill Norco, but isn't due to be refilled until 10/31 at the earliest. 5/325 mg 4x/day as needed- #120- can be refilled 10/31- wrote on Rx-    2. Can turn lid upside down so she can open pill bottle.    3. Advised can NOT take more pills than prescribed- if she does again, we will have ot discharge her frm clinic/cannot prescribe pain meds anymore- that's prescribing without a license per federal guidelines/laws.    4. Flexeril can take 2x/day- but no more than 5 days/week- since won't work as well-   5. F/U in 3 months- on pain.   6. Do UDS today. Is due

## 2020-10-30 NOTE — Progress Notes (Signed)
Subjective:    Patient ID: Connie Ross, female    DOB: 08/09/1955, 65 y.o.   MRN: [id number]  HPI Pt is a 65 yr old female with RA  on Humira, chronic low back pain, HTN, DM- B/L knee replacement here for f/u on chronic pain.   Just took her last pill- but was filled 10/3- appears is taking pills more than prescribed- based on average- sounds like has been taking ~ 1 extra pill/day.    Pain about the same lately, however taking 4 pills/day is better than 3 pills/day-   Flexeril- taking 2x/day 7 days/week Sleeping more now and "helping her a lot".   Does Humira every 2 weeks.   Pain Inventory Average Pain 8 Pain Right Now 7 My pain is constant, tingling, and aching  In the last 24 hours, has pain interfered with the following? General activity 7 Relation with others 5 Enjoyment of life 5 What TIME of day is your pain at its worst? morning  and night Sleep (in general) Fair  Pain is worse with: bending and some activites Pain improves with: pacing activities and medication Relief from Meds: 9  Family History  Adopted: Yes  Problem Relation Age of Onset   Cancer Mother        breast   Healthy Son    Healthy Daughter    Healthy Daughter    Social History   Socioeconomic History   Marital status: Married    Spouse name: Not on file   Number of children: Not on file   Years of education: Not on file   Highest education level: Not on file  Occupational History   Not on file  Tobacco Use   Smoking status: Never   Smokeless tobacco: Never  Vaping Use   Vaping Use: Never used  Substance and Sexual Activity   Alcohol use: No   Drug use: No   Sexual activity: Not Currently  Other Topics Concern   Not on file  Social History Narrative   Not on file   Social Determinants of Health   Financial Resource Strain: Not on file  Food Insecurity: Not on file  Transportation Needs: Not on file  Physical Activity: Not on file  Stress: Not on file  Social  Connections: Not on file   Past Surgical History:  Procedure Laterality Date   EYE SURGERY     bilateral cataracts   KNEE ARTHROPLASTY     TOTAL KNEE ARTHROPLASTY Right 02/28/2013   TOTAL KNEE ARTHROPLASTY Right 02/27/2013   Procedure: TOTAL KNEE ARTHROPLASTY- knee;  Surgeon: Nadara Mustard, MD;  Location: [facility];  Service: Orthopedics;  Laterality: Right;  Right Total Knee Arthroplasty   TOTAL KNEE ARTHROPLASTY Left 07/04/2014   Procedure: LEFT TOTAL KNEE ARTHROPLASTY;  Surgeon: Nadara Mustard, MD;  Location: [facility];  Service: Orthopedics;  Laterality: Left;   Past Surgical History:  Procedure Laterality Date   EYE SURGERY     bilateral cataracts   KNEE ARTHROPLASTY     TOTAL KNEE ARTHROPLASTY Right 02/28/2013   TOTAL KNEE ARTHROPLASTY Right 02/27/2013   Procedure: TOTAL KNEE ARTHROPLASTY- knee;  Surgeon: Nadara Mustard, MD;  Location: [facility];  Service: Orthopedics;  Laterality: Right;  Right Total Knee Arthroplasty   TOTAL KNEE ARTHROPLASTY Left 07/04/2014   Procedure: LEFT TOTAL KNEE ARTHROPLASTY;  Surgeon: Nadara Mustard, MD;  Location: [facility];  Service: Orthopedics;  Laterality: Left;   Past Medical History:  Diagnosis Date   Anxiety  takes no meds   DVT (deep venous thrombosis) (HCC)    GERD (gastroesophageal reflux disease)    High cholesterol    Hypertension    Rheumatoid arthritis (HCC)    Type II diabetes mellitus (HCC)    BP (!) 149/83   Pulse 72   Ht 5\' 1"  (1.549 m)   Wt 212 lb (96.2 kg)   LMP  (LMP Unknown)   SpO2 93%   BMI 40.06 kg/m   Opioid Risk Score:   Fall Risk Score:  `1  Depression screen PHQ 2/9  Depression screen [facility] 2/9 07/27/2020 05/25/2020  Decreased Interest 0 0  Down, Depressed, Hopeless 0 0  PHQ - 2 Score 0 0  Altered sleeping - 0  Tired, decreased energy - 0  Change in appetite - 0  Feeling bad or failure about yourself  - 0  Trouble concentrating - 0  Moving slowly or fidgety/restless - 0  Suicidal thoughts - 0  PHQ-9 Score - 0     Review  of Systems  Musculoskeletal:        Whole right side of body pain  All other systems reviewed and are negative.     Objective:   Physical Exam Awake, alert, appropriate, spanish speaking- accompanied by daughter, NAD Still TTP across low back- band across low back.   Less trigger points in paraspinals palpated, but stlll has some.        Assessment & Plan:    Pt is a 65 yr old female with RA  on Humira, chronic low back pain, HTN, DM- B/L knee replacement here for f/u on chronic pain.    Will refill Norco, but isn't due to be refilled until 10/31 at the earliest. 5/325 mg 4x/day as needed- #120- can be refilled 10/31- wrote on Rx-    2. Can turn lid upside down so she can open pill bottle.    3. Advised can NOT take more pills than prescribed- if she does again, we will have ot discharge her frm clinic/cannot prescribe pain meds anymore- that's prescribing without a license per federal guidelines/laws.    4. Flexeril can take 2x/day- but no more than 5 days/week- since won't work as well-   5. F/U in 3 months- on pain.   6. Do UDS today. Is due   I spent a total of 21 minutes on visit- as detailed above.

## 2020-11-04 ENCOUNTER — Telehealth: Payer: Self-pay

## 2020-11-05 MED ORDER — HYDROCODONE-ACETAMINOPHEN 5-325 MG PO TABS: 1.0000 | ORAL_TABLET | Freq: Four times a day (QID) | ORAL | 0 refills | Status: DC | PRN

## 2020-11-06 LAB — TOXASSURE SELECT,+ANTIDEPR,UR

## 2020-11-06 NOTE — Telephone Encounter (Signed)
Task completed

## 2020-11-10 ENCOUNTER — Other Ambulatory Visit (HOSPITAL_COMMUNITY): Payer: Self-pay

## 2020-11-16 ENCOUNTER — Other Ambulatory Visit (HOSPITAL_COMMUNITY): Payer: Self-pay

## 2020-11-16 ENCOUNTER — Other Ambulatory Visit: Payer: Self-pay | Admitting: Physician Assistant

## 2020-11-16 DIAGNOSIS — M0609 Rheumatoid arthritis without rheumatoid factor, multiple sites: Secondary | ICD-10-CM

## 2020-11-16 MED ORDER — RASUVO 20 MG/0.4ML ~~LOC~~ SOAJ
20.0000 mg | SUBCUTANEOUS | 0 refills | Status: DC
  Filled 2020-11-16: qty 1.6, 28d supply, fill #0

## 2020-11-16 NOTE — Telephone Encounter (Signed)
Next Visit: 01/19/2021  Last Visit: 10/20/2020  Last Fill: 09/16/2020  DX: Rheumatoid arthritis of multiple sites with negative rheumatoid factor   Current Dose per office note 10/20/2020: Rasuvo 20 mg subcutaneous injections once weekly  Labs: 08/12/2020, Anemia is stable.  Glucose is elevated.   Okay to refill MTX?

## 2020-11-16 NOTE — Telephone Encounter (Signed)
Please advise patient to come in for labs 

## 2020-11-18 ENCOUNTER — Other Ambulatory Visit (HOSPITAL_COMMUNITY): Payer: Self-pay

## 2020-12-04 ENCOUNTER — Telehealth: Payer: Self-pay

## 2020-12-04 DIAGNOSIS — Z79899 Other long term (current) drug therapy: Secondary | ICD-10-CM | POA: Diagnosis not present

## 2020-12-04 MED ORDER — HYDROCODONE-ACETAMINOPHEN 5-325 MG PO TABS: 1.0000 | ORAL_TABLET | Freq: Four times a day (QID) | ORAL | 0 refills | Status: DC | PRN

## 2020-12-04 NOTE — Telephone Encounter (Signed)
Pt daughter called requesting hydrocodone refill for her mother . Per pmp last filled 11/05/2020.

## 2020-12-04 NOTE — Telephone Encounter (Signed)
I sent in her prescriptions- thanks- ML

## 2020-12-05 LAB — CBC WITH DIFFERENTIAL/PLATELET
Absolute Monocytes: 391 cells/uL (ref 200–950)
Basophils Absolute: 31 cells/uL (ref 0–200)
Basophils Relative: 0.5 %
Eosinophils Absolute: 372 cells/uL (ref 15–500)
Eosinophils Relative: 6 %
HCT: 35.6 % (ref 35.0–45.0)
Hemoglobin: 11.2 g/dL — ABNORMAL LOW (ref 11.7–15.5)
Lymphs Abs: 1135 cells/uL (ref 850–3900)
MCH: 30.6 pg (ref 27.0–33.0)
MCHC: 31.5 g/dL — ABNORMAL LOW (ref 32.0–36.0)
MCV: 97.3 fL (ref 80.0–100.0)
MPV: 10.8 fL (ref 7.5–12.5)
Monocytes Relative: 6.3 %
Neutro Abs: 4272 cells/uL (ref 1500–7800)
Neutrophils Relative %: 68.9 %
Platelets: 219 10*3/uL (ref 140–400)
RBC: 3.66 10*6/uL — ABNORMAL LOW (ref 3.80–5.10)
RDW: 13.1 % (ref 11.0–15.0)
Total Lymphocyte: 18.3 %
WBC: 6.2 10*3/uL (ref 3.8–10.8)

## 2020-12-05 LAB — COMPLETE METABOLIC PANEL WITH GFR
AG Ratio: 1.2 (calc) (ref 1.0–2.5)
ALT: 13 U/L (ref 6–29)
AST: 15 U/L (ref 10–35)
Albumin: 3.8 g/dL (ref 3.6–5.1)
Alkaline phosphatase (APISO): 93 U/L (ref 37–153)
BUN: 23 mg/dL (ref 7–25)
CO2: 21 mmol/L (ref 20–32)
Calcium: 9.1 mg/dL (ref 8.6–10.4)
Chloride: 103 mmol/L (ref 98–110)
Creat: 0.66 mg/dL (ref 0.50–1.05)
Globulin: 3.3 g/dL (calc) (ref 1.9–3.7)
Glucose, Bld: 355 mg/dL — ABNORMAL HIGH (ref 65–139)
Potassium: 4.7 mmol/L (ref 3.5–5.3)
Sodium: 135 mmol/L (ref 135–146)
Total Bilirubin: 0.3 mg/dL (ref 0.2–1.2)
Total Protein: 7.1 g/dL (ref 6.1–8.1)
eGFR: 97 mL/min/{1.73_m2} (ref >=60)

## 2020-12-05 NOTE — Progress Notes (Signed)
Glucose is elevated at 355 and hemoglobin is low.  Hemoglobin is improving.  Please forward results to her PCP.  Please advise patient to contact her PCP for the management of elevated glucose.

## 2020-12-08 ENCOUNTER — Other Ambulatory Visit (HOSPITAL_COMMUNITY): Payer: Self-pay

## 2020-12-10 ENCOUNTER — Other Ambulatory Visit (HOSPITAL_COMMUNITY): Payer: Self-pay

## 2020-12-10 ENCOUNTER — Other Ambulatory Visit: Payer: Self-pay | Admitting: Rheumatology

## 2020-12-10 DIAGNOSIS — M0609 Rheumatoid arthritis without rheumatoid factor, multiple sites: Secondary | ICD-10-CM

## 2020-12-10 MED ORDER — RASUVO 20 MG/0.4ML ~~LOC~~ SOAJ
20.0000 mg | SUBCUTANEOUS | 2 refills | Status: DC
  Filled 2020-12-10: qty 1.6, 28d supply, fill #0
  Filled 2021-01-11: qty 1.6, 28d supply, fill #1
  Filled 2021-02-05: qty 1.6, 28d supply, fill #2

## 2020-12-10 NOTE — Telephone Encounter (Signed)
Next Visit: 01/19/2021   Last Visit: 10/20/2020   Last Fill: 11/16/2020 (30 day supply)   DX: Rheumatoid arthritis of multiple sites with negative rheumatoid factor    Current Dose per office note 10/20/2020: Rasuvo 20 mg subcutaneous injections once weekly   Labs: 12/04/2020 Glucose is elevated at 355 and hemoglobin is low.  Hemoglobin is improving.     Okay to refill MTX?

## 2020-12-11 ENCOUNTER — Other Ambulatory Visit (HOSPITAL_COMMUNITY): Payer: Self-pay

## 2020-12-14 ENCOUNTER — Other Ambulatory Visit (HOSPITAL_COMMUNITY): Payer: Self-pay

## 2021-01-01 ENCOUNTER — Telehealth: Payer: Self-pay | Admitting: *Deleted

## 2021-01-01 MED ORDER — HYDROCODONE-ACETAMINOPHEN 5-325 MG PO TABS: 1.0000 | ORAL_TABLET | Freq: Four times a day (QID) | ORAL | 0 refills | Status: DC | PRN

## 2021-01-01 NOTE — Telephone Encounter (Signed)
Mrs Laszlo daughter called for a refill on her hydrocodone. Her last fill per PMP was 12/05/20 and next appt is 02/05/21.

## 2021-01-05 ENCOUNTER — Other Ambulatory Visit (HOSPITAL_COMMUNITY): Payer: Self-pay

## 2021-01-07 NOTE — Progress Notes (Signed)
Office Visit Note  Patient: Connie Ross             Date of Birth: 12-Aug-1955           MRN: [id number]             PCP: Iona Beard, MD Referring: Iona Beard, MD Visit Date: 01/19/2021 Occupation: _0 @  Subjective:  Medication monitoring   History of Present Illness: Connie Ross is a 66 y.o. female with history of seronegative rheumatoid arthritis and DDD.  She is on rasuvo 20 mg sq injections once weekly, folic acid 2 mg daily, and humira 40 mg sq injections once every 14 days.  She has not missed any doses of these medications recently.  She has noticed significant improvement in her joint pain and inflammation on the current combination.  She has had some increased stiffness and arthralgias with colder weather temperatures but denies any joint swelling at this time.  Her morning stiffness has been only lasting for about 1 hour daily.  She continues to use a cane to assist with ambulation.  She has not had any recent falls. She denies any recent infections or new medical conditions.   Activities of Daily Living:  Patient reports morning stiffness for 1 hour.   Patient Reports nocturnal pain.  Difficulty dressing/grooming: Reports Difficulty climbing stairs: Reports Difficulty getting out of chair: Reports Difficulty using hands for taps, buttons, cutlery, and/or writing: Reports  Review of Systems  Constitutional:  Negative for fatigue.  HENT:  Positive for mouth dryness. Negative for mouth sores and nose dryness.   Eyes:  Positive for dryness. Negative for pain and itching.  Respiratory:  Negative for shortness of breath and difficulty breathing.   Cardiovascular:  Negative for chest pain and palpitations.  Gastrointestinal:  Negative for blood in stool, constipation and diarrhea.  Endocrine: Positive for increased urination.  Genitourinary:  Negative for difficulty urinating.  Musculoskeletal:  Positive for joint pain, joint pain, myalgias,  morning stiffness, muscle tenderness and myalgias. Negative for joint swelling.  Skin:  Negative for color change, rash and redness.  Allergic/Immunologic: Negative for susceptible to infections.  Neurological:  Positive for dizziness and headaches. Negative for numbness, memory loss and weakness.  Hematological:  Negative for bruising/bleeding tendency.  Psychiatric/Behavioral:  Negative for confusion.    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 05/25/2020   Chronic bilateral low back pain with bilateral sciatica 05/25/2020   Pain in right foot 04/06/2016   Rheumatoid arthritis of multiple sites with negative rheumatoid factor ([place]) 02/03/2016   High risk medication use 02/03/2016   Osteoarthritis of lumbar spine 02/03/2016   Essential hypertension 02/03/2016   History of diabetes mellitus 02/03/2016   History of diabetic neuropathy 02/03/2016   Dyslipidemia 02/03/2016   Vitamin D deficiency 02/03/2016   Abnormality of gait 03/15/2013   Right knee pain 03/15/2013   History of total knee replacement, bilateral 02/27/2013    Past Medical History:  Diagnosis Date   Anxiety    takes no meds   DVT (deep venous thrombosis) (HCC)    GERD (gastroesophageal reflux disease)    High cholesterol    Hypertension    Rheumatoid arthritis ([place])    Type II diabetes mellitus ([place])     Family History  Adopted: Yes  Problem Relation Age of  Onset   Cancer Mother        breast   Healthy Son    Healthy Daughter    Healthy Daughter    Past Surgical History:  Procedure Laterality Date   EYE SURGERY     bilateral cataracts   KNEE ARTHROPLASTY     TOTAL KNEE ARTHROPLASTY Right 02/28/2013   TOTAL KNEE ARTHROPLASTY Right 02/27/2013   Procedure: TOTAL KNEE ARTHROPLASTY- knee;  Surgeon: Newt Minion, MD;   Location: [place];  Service: Orthopedics;  Laterality: Right;  Right Total Knee Arthroplasty   TOTAL KNEE ARTHROPLASTY Left 07/04/2014   Procedure: LEFT TOTAL KNEE ARTHROPLASTY;  Surgeon: Newt Minion, MD;  Location: [place];  Service: Orthopedics;  Laterality: Left;   Social History   Social History Narrative   Not on file   Immunization History  Administered Date(s) Administered   Influenza-Unspecified 09/03/2012   Moderna Sars-Covid-2 Vaccination 07/25/2019, 08/22/2019   Pneumococcal Polysaccharide-23 03/01/2013     Objective: Vital Signs: BP (!) 156/82 (BP Location: Left Arm, Patient Position: Sitting, Cuff Size: Normal)    Pulse 71    Ht _0  (1.549 m)    Wt 214 lb 3.2 oz (97.2 kg)    LMP  (LMP Unknown)    BMI 40.47 kg/m    Physical Exam Vitals and nursing note reviewed.  Constitutional:      Appearance: She is well-developed.  HENT:     Head: Normocephalic and atraumatic.  Eyes:     Conjunctiva/sclera: Conjunctivae normal.  Pulmonary:     Effort: Pulmonary effort is normal.  Abdominal:     Palpations: Abdomen is soft.  Musculoskeletal:     Cervical back: Normal range of motion.  Skin:    General: Skin is warm and dry.     Capillary Refill: Capillary refill takes less than 2 seconds.  Neurological:     Mental Status: She is alert and oriented to person, place, and time.  Psychiatric:        Behavior: Behavior normal.     Musculoskeletal Exam: Patient remained seated during examination.  C-spine has slightly limited range of motion with lateral rotation.  Some postural thoracic kyphosis noted.  No midline spinal tenderness noted.  Shoulder joints have good range of motion with no discomfort on examination.  Elbow joints have good range of motion with no tenderness or inflammation.  Wrist joints have slightly limited range of motion with some synovial thickening.  Synovitis of the right fourth PIP joint and left second PIP joint noted.  Complete fist formation bilaterally.   Hip joints difficult to assess in seated position.  Both knee replacements have good range of motion.  Ankle joints have good range of motion with no joint tenderness.  CDAI Exam: CDAI Score: 5  Patient Global: 7 mm; Provider Global: 3 mm Swollen: 2 ; Tender: 2  Joint Exam 01/19/2021      Right  Left  PIP 2     Swollen Tender  PIP 4  Swollen Tender        Investigation: No additional findings.  Imaging: No results found.  Recent Labs: Lab Results  Component Value Date   WBC 6.2 12/04/2020   HGB 11.2 (L) 12/04/2020   PLT 219 12/04/2020   NA 135 12/04/2020   K 4.7 12/04/2020   CL 103 12/04/2020   CO2 21 12/04/2020   GLUCOSE 355 (H) 12/04/2020   BUN 23 12/04/2020   CREATININE 0.66 12/04/2020   BILITOT 0.3 12/04/2020  ALKPHOS 102 06/20/2016   AST 15 12/04/2020   ALT 13 12/04/2020   PROT 7.1 12/04/2020   ALBUMIN 3.7 06/20/2016   CALCIUM 9.1 12/04/2020   GFRAA 108 06/29/2020   QFTBGOLDPLUS NEGATIVE 02/10/2020    Speciality Comments: No specialty comments available.  Procedures:  No procedures performed Allergies: Patient has no known allergies.   Assessment / Plan:     Visit Diagnoses: Rheumatoid arthritis of multiple sites with negative rheumatoid factor (HCC) -  RF negative, CCP negative, elevated ESR, positive ANA, positive Ro: She has noticed significant clinical improvement on combination therapy: Rasuvo 20 mg subcutaneous injections once weekly and Humira 40 mg sq injections every 14 days.  She has been compliant taking these medications as prescribed since her last office visit on 10/20/2020.  She has not missed any doses of Rasuvo or Humira recently.  She has occasional joint stiffness and arthralgias with colder weather temperatures but has not noticed any inflammation.  She has mild synovitis of the right fourth PIP and left second PIP joint.  She was able to make a complete fist on examination today.  The range of motion in her ankle joints has improved and  she is not experiencing any tenderness at this time.  X-rays of both hands and feet were updated today to assess for radiographic progression.  She will remain on the current treatment regimen.  She was advised to notify us if she develops increased joint pain or joint swelling.  We discussed the importance of remaining compliant with taking her medications as prescribed.  She voiced understanding.  She will follow-up in the office in 5 months.- Plan: XR Hand 2 View Left, XR Hand 2 View Right, XR Foot 2 Views Right, XR Foot 2 Views Left  High risk medication use - Rasuvo 20 mg subcutaneous injections once weekly, folic acid 2 mg by mouth daily, Humira 40 mg subcutaneous injections every 14 days.  Discussed the importance of remaining compliant with taking these medications as prescribed.  She does not need any refills at this time. CBC and CMP updated on 12/04/20.  She will be due to update lab work in March and every 3 months.   TB gold negative on 02/10/20.  Patient order was placed today.  She will be having updated lab work the first week of February.- Plan: QuantiFERON-TB Gold Plus She has not had any recent infections.  Discussed the importance of holding Rasuvo and Humira if she develops signs or symptoms of an infection and to resume once the infection has completely cleared.  Screening for tuberculosis: Future order for TB gold placed today.  History of total knee replacement, bilateral: Doing well.  She has occasional discomfort in her right knee replacement.  She continues to use a cane to assist with ambulation.  She has not had any recent falls.  DDD (degenerative disc disease), lumbar: She had no midline spinal tenderness on examination today.  Other medical conditions are listed as follows:  Dyslipidemia  History of diabetic neuropathy  History of diabetes mellitus  History of hypertension  History of vitamin D deficiency  Language barrier    Orders: Orders Placed This  Encounter  Procedures   XR Hand 2 View Left   XR Hand 2 View Right   XR Foot 2 Views Right   XR Foot 2 Views Left   QuantiFERON-TB Gold Plus   No orders of the defined types were placed in this encounter.     Follow-Up Instructions: Return in  about 5 months (around 06/19/2021) for Rheumatoid arthritis, DDD.   Ofilia Neas, PA-C  Note - This record has been created using Dragon software.  Chart creation errors have been sought, but may not always  have been located. Such creation errors do not reflect on  the standard of medical care.

## 2021-01-11 ENCOUNTER — Other Ambulatory Visit: Payer: Self-pay | Admitting: Physician Assistant

## 2021-01-11 ENCOUNTER — Other Ambulatory Visit (HOSPITAL_COMMUNITY): Payer: Self-pay

## 2021-01-11 DIAGNOSIS — M0609 Rheumatoid arthritis without rheumatoid factor, multiple sites: Secondary | ICD-10-CM

## 2021-01-11 MED ORDER — HUMIRA (2 PEN) 40 MG/0.4ML ~~LOC~~ AJKT
40.0000 mg | AUTO-INJECTOR | SUBCUTANEOUS | 0 refills | Status: DC
  Filled 2021-01-11: qty 2, 28d supply, fill #0
  Filled 2021-02-05: qty 2, 28d supply, fill #1
  Filled 2021-03-04: qty 2, 28d supply, fill #2

## 2021-01-11 NOTE — Telephone Encounter (Signed)
Next Visit: 01/19/2021   Last Visit: 10/20/2020   Last Fill: 10/15/2020  DX: Rheumatoid arthritis of multiple sites with negative rheumatoid factor    Current Dose per office note 10/20/2020: Humira 40 mg subcutaneous injections every 14 days.   Labs: 12/04/2020 Glucose is elevated at 355 and hemoglobin is low.  Hemoglobin is improving.    TB Gold: 02/10/2020 Neg   Okay to refill Humira?

## 2021-01-13 ENCOUNTER — Other Ambulatory Visit (HOSPITAL_COMMUNITY): Payer: Self-pay

## 2021-01-19 ENCOUNTER — Ambulatory Visit: Payer: Self-pay

## 2021-01-19 ENCOUNTER — Encounter: Payer: Self-pay | Admitting: Physician Assistant

## 2021-01-19 ENCOUNTER — Other Ambulatory Visit: Payer: Self-pay

## 2021-01-19 ENCOUNTER — Ambulatory Visit (INDEPENDENT_AMBULATORY_CARE_PROVIDER_SITE_OTHER): Payer: BC Managed Care – PPO | Admitting: Physician Assistant

## 2021-01-19 VITALS — BP 156/82 | HR 71 | Ht 61.0 in | Wt 214.2 lb

## 2021-01-19 DIAGNOSIS — Z79899 Other long term (current) drug therapy: Secondary | ICD-10-CM

## 2021-01-19 DIAGNOSIS — M79641 Pain in right hand: Secondary | ICD-10-CM

## 2021-01-19 DIAGNOSIS — Z96653 Presence of artificial knee joint, bilateral: Secondary | ICD-10-CM | POA: Diagnosis not present

## 2021-01-19 DIAGNOSIS — Z8679 Personal history of other diseases of the circulatory system: Secondary | ICD-10-CM

## 2021-01-19 DIAGNOSIS — E785 Hyperlipidemia, unspecified: Secondary | ICD-10-CM | POA: Diagnosis not present

## 2021-01-19 DIAGNOSIS — Z8639 Personal history of other endocrine, nutritional and metabolic disease: Secondary | ICD-10-CM | POA: Diagnosis not present

## 2021-01-19 DIAGNOSIS — M5136 Other intervertebral disc degeneration, lumbar region: Secondary | ICD-10-CM

## 2021-01-19 DIAGNOSIS — M79642 Pain in left hand: Secondary | ICD-10-CM

## 2021-01-19 DIAGNOSIS — M79671 Pain in right foot: Secondary | ICD-10-CM | POA: Diagnosis not present

## 2021-01-19 DIAGNOSIS — Z111 Encounter for screening for respiratory tuberculosis: Secondary | ICD-10-CM

## 2021-01-19 DIAGNOSIS — Z789 Other specified health status: Secondary | ICD-10-CM

## 2021-01-19 DIAGNOSIS — M79672 Pain in left foot: Secondary | ICD-10-CM | POA: Diagnosis not present

## 2021-01-19 DIAGNOSIS — M0609 Rheumatoid arthritis without rheumatoid factor, multiple sites: Secondary | ICD-10-CM

## 2021-01-19 NOTE — Patient Instructions (Addendum)
Standing Labs We placed an order today for your standing lab work.   Please have your standing labs drawn in March and every 3 months   CBC with diff, CMP with GFR, and TB gold due   If possible, please have your labs drawn 2 weeks prior to your appointment so that the provider can discuss your results at your appointment.  Please note that you may see your imaging and lab results in MyChart before we have reviewed them. We may be awaiting multiple results to interpret others before contacting you. Please allow our office up to 72 hours to thoroughly review all of the results before contacting the office for clarification of your results.  We have open lab daily: Monday through Thursday from 1:30-4:30 PM and Friday from 1:30-4:00 PM at the office of Dr. Pollyann Savoy, [facility].   Please be advised, all patients with office appointments requiring lab work will take precedent over walk-in lab work.  If possible, please come for your lab work on Monday and Friday afternoons, as you may experience shorter wait times. The office is located at [address], [address], [place], [place] [address] No appointment is necessary.   Labs are drawn by [facility]. Please bring your co-pay at the time of your lab draw.  You may receive a bill from [facility] for your lab work.  Please note if you are on Hydroxychloroquine and and an order has been placed for a Hydroxychloroquine level, you will need to have it drawn 4 hours or more after your last dose.  If you wish to have your labs drawn at another location, please call the office 24 hours in advance to send orders.  If you have any questions regarding directions or hours of operation,  please call [phone].   As a reminder, please drink plenty of water prior to coming for your lab work. Thanks!

## 2021-01-19 NOTE — Progress Notes (Signed)
X-rays of both hands and both feet were consistent with rheumatoid arthritis and osteoarthritis overlap.   No comparison films available.

## 2021-01-30 ENCOUNTER — Other Ambulatory Visit: Payer: Self-pay | Admitting: Physical Medicine and Rehabilitation

## 2021-02-04 ENCOUNTER — Telehealth: Payer: Self-pay | Admitting: *Deleted

## 2021-02-04 MED ORDER — HYDROCODONE-ACETAMINOPHEN 5-325 MG PO TABS: 1.0000 | ORAL_TABLET | Freq: Four times a day (QID) | ORAL | 0 refills | Status: DC | PRN

## 2021-02-04 NOTE — Telephone Encounter (Signed)
Daughter called to request a refill for Connie Ross's hydrocodone. Per PMP last refill was 01/01/21. Next appt is 02/05/21.

## 2021-02-05 ENCOUNTER — Encounter: Payer: Self-pay | Admitting: Physical Medicine and Rehabilitation

## 2021-02-05 ENCOUNTER — Telehealth: Payer: Self-pay

## 2021-02-05 ENCOUNTER — Other Ambulatory Visit: Payer: Self-pay

## 2021-02-05 ENCOUNTER — Encounter
Payer: BC Managed Care – PPO | Attending: Physical Medicine and Rehabilitation | Admitting: Physical Medicine and Rehabilitation

## 2021-02-05 ENCOUNTER — Other Ambulatory Visit (HOSPITAL_COMMUNITY): Payer: Self-pay

## 2021-02-05 VITALS — BP 156/78 | HR 79 | Ht 61.0 in | Wt 211.0 lb

## 2021-02-05 DIAGNOSIS — M62838 Other muscle spasm: Secondary | ICD-10-CM | POA: Insufficient documentation

## 2021-02-05 DIAGNOSIS — M5116 Intervertebral disc disorders with radiculopathy, lumbar region: Secondary | ICD-10-CM | POA: Insufficient documentation

## 2021-02-05 DIAGNOSIS — M0609 Rheumatoid arthritis without rheumatoid factor, multiple sites: Secondary | ICD-10-CM | POA: Diagnosis not present

## 2021-02-05 DIAGNOSIS — M4726 Other spondylosis with radiculopathy, lumbar region: Secondary | ICD-10-CM | POA: Insufficient documentation

## 2021-02-05 DIAGNOSIS — Z5181 Encounter for therapeutic drug level monitoring: Secondary | ICD-10-CM | POA: Insufficient documentation

## 2021-02-05 DIAGNOSIS — Z79891 Long term (current) use of opiate analgesic: Secondary | ICD-10-CM | POA: Insufficient documentation

## 2021-02-05 DIAGNOSIS — G894 Chronic pain syndrome: Secondary | ICD-10-CM | POA: Insufficient documentation

## 2021-02-05 MED ORDER — DULOXETINE HCL 60 MG PO CPEP: 60.0000 mg | ORAL_CAPSULE | Freq: Every day | ORAL | 1 refills | Status: DC

## 2021-02-05 MED ORDER — HYDROCODONE-ACETAMINOPHEN 5-325 MG PO TABS: 1.0000 | ORAL_TABLET | Freq: Four times a day (QID) | ORAL | 0 refills | Status: DC | PRN

## 2021-02-05 MED ORDER — TRAMADOL HCL 50 MG PO TABS: 100.0000 mg | ORAL_TABLET | Freq: Four times a day (QID) | ORAL | 0 refills | Status: DC | PRN

## 2021-02-05 NOTE — Addendum Note (Signed)
Addended by: Genice Rouge on: 02/05/2021 02:01 PM   Modules accepted: Orders

## 2021-02-05 NOTE — Patient Instructions (Signed)
Pt is a 66 yr old female with RA  on Humira, chronic low back pain, HTN, DM- B/L knee replacement here for f/u on chronic pain.     Call around and let me know where can get pain meds/Norco and I will send them to whichever pharmacy you can find.   2. Will try Tramadol 100 mg 4x/day as needed #240- for 1 month- to get her through until can get some Norco- due to RA pain.   3. Con't Duloxetine, but will change to 60 mg capsule-    4. Went over serotonin syndrome and slight increased risk with tramadol and Duloxetine- Sx's include fever, feeling hot, sweaty, etc.    5. Will f/u in 3 months-

## 2021-02-05 NOTE — Progress Notes (Signed)
Subjective:    Patient ID: Connie Ross, female    DOB: 06/29/1955, 66 y.o.   MRN: [id number]  HPI  Pt is a 66 yr old female with RA  on Humira, chronic low back pain, HTN, DM- B/L knee replacement here for f/u on chronic pain.    Ran out of pain meds so hurting really bad.  Tramadol didn't work for her-  But hadn't tried on higher dose.  Hasn't tried Tylenol #3-   Ran out yesterday of pain meds.   Had bene sleeping better and doing better until ran out of meds.   Pain in general had bene doing better.   Pain Inventory Average Pain 8 Pain Right Now 8 My pain is intermittent and aching  In the last 24 hours, has pain interfered with the following? General activity 7 Relation with others 7 Enjoyment of life 8 What TIME of day is your pain at its worst? morning  and night Sleep (in general) Fair  Pain is worse with: walking, sitting, inactivity, and standing Pain improves with: rest, heat/ice, and medication Relief from Meds: 9  Family History  Adopted: Yes  Problem Relation Age of Onset   Cancer Mother        breast   Healthy Son    Healthy Daughter    Healthy Daughter    Social History   Socioeconomic History   Marital status: Married    Spouse name: Not on file   Number of children: Not on file   Years of education: Not on file   Highest education level: Not on file  Occupational History   Not on file  Tobacco Use   Smoking status: Never   Smokeless tobacco: Never  Vaping Use   Vaping Use: Never used  Substance and Sexual Activity   Alcohol use: No   Drug use: No   Sexual activity: Not Currently  Other Topics Concern   Not on file  Social History Narrative   Not on file   Social Determinants of Health   Financial Resource Strain: Not on file  Food Insecurity: Not on file  Transportation Needs: Not on file  Physical Activity: Not on file  Stress: Not on file  Social Connections: Not on file   Past Surgical History:  Procedure  Laterality Date   EYE SURGERY     bilateral cataracts   KNEE ARTHROPLASTY     TOTAL KNEE ARTHROPLASTY Right 02/28/2013   TOTAL KNEE ARTHROPLASTY Right 02/27/2013   Procedure: TOTAL KNEE ARTHROPLASTY- knee;  Surgeon: Newt Minion, MD;  Location: [place];  Service: Orthopedics;  Laterality: Right;  Right Total Knee Arthroplasty   TOTAL KNEE ARTHROPLASTY Left 07/04/2014   Procedure: LEFT TOTAL KNEE ARTHROPLASTY;  Surgeon: Newt Minion, MD;  Location: [place];  Service: Orthopedics;  Laterality: Left;   Past Surgical History:  Procedure Laterality Date   EYE SURGERY     bilateral cataracts   KNEE ARTHROPLASTY     TOTAL KNEE ARTHROPLASTY Right 02/28/2013   TOTAL KNEE ARTHROPLASTY Right 02/27/2013   Procedure: TOTAL KNEE ARTHROPLASTY- knee;  Surgeon: Newt Minion, MD;  Location: [place];  Service: Orthopedics;  Laterality: Right;  Right Total Knee Arthroplasty   TOTAL KNEE ARTHROPLASTY Left 07/04/2014   Procedure: LEFT TOTAL KNEE ARTHROPLASTY;  Surgeon: Newt Minion, MD;  Location: [place];  Service: Orthopedics;  Laterality: Left;   Past Medical History:  Diagnosis Date   Anxiety    takes no meds  DVT (deep venous thrombosis) (HCC)    GERD (gastroesophageal reflux disease)    High cholesterol    Hypertension    Rheumatoid arthritis (HCC)    Type II diabetes mellitus (HCC)    BP (!) 156/78    Pulse 79    Ht 5\' 1"  (1.549 m)    Wt 211 lb (95.7 kg)    LMP  (LMP Unknown)    SpO2 95%    BMI 39.87 kg/m   Opioid Risk Score:   Fall Risk Score:  `1  Depression screen PHQ 2/9  Depression screen [facility] 2/9 02/05/2021 07/27/2020 05/25/2020  Decreased Interest 0 0 0  Down, Depressed, Hopeless 0 0 0  PHQ - 2 Score 0 0 0  Altered sleeping - - 0  Tired, decreased energy - - 0  Change in appetite - - 0  Feeling bad or failure about yourself  - - 0  Trouble concentrating - - 0  Moving slowly or fidgety/restless - - 0  Suicidal thoughts - - 0  PHQ-9 Score - - 0    Review of Systems  Musculoskeletal:   Positive for back pain and gait problem.       Pain in right shoulder, pain in both legs, pain in both hands  All other systems reviewed and are negative.     Objective:   Physical Exam Awake, alert, appropriate, spanish speaking, accompanied by daughter, NAD  TTP across band of low back- midline and B/L sides-  So TTP, don't want to push her since out of pain meds      Assessment & Plan:   Pt is a 66 yr old female with RA  on Humira, chronic low back pain, HTN, DM- B/L knee replacement here for f/u on chronic pain.     Call around and let me know where can get pain meds/Norco and I will send them to whichever pharmacy you can find.   2. Will try Tramadol 100 mg 4x/day as needed #240- for 1 month- to get her through until can get some Norco- due to RA pain.   3. Con't Duloxetine, but will change to 60 mg capsule-    4. Went over serotonin syndrome and slight increased risk with tramadol and Duloxetine- Sx's include fever, feeling hot, sweaty, etc.    5. Will f/u in 3 months-    I spent  21  minutes on appointment today; more than 50% of that time was spent on educating patient as per details/plan above. Esp on deciding options to try and get pain better controlled until can get Norco.

## 2021-02-05 NOTE — Telephone Encounter (Signed)
Patient's daughter called and stated they found the Hydrocodone in [place], [place] at [facility].

## 2021-02-06 DIAGNOSIS — E781 Pure hyperglyceridemia: Secondary | ICD-10-CM | POA: Diagnosis not present

## 2021-02-06 DIAGNOSIS — I10 Essential (primary) hypertension: Secondary | ICD-10-CM | POA: Diagnosis not present

## 2021-02-06 DIAGNOSIS — M069 Rheumatoid arthritis, unspecified: Secondary | ICD-10-CM | POA: Diagnosis not present

## 2021-02-06 DIAGNOSIS — E1165 Type 2 diabetes mellitus with hyperglycemia: Secondary | ICD-10-CM | POA: Diagnosis not present

## 2021-02-08 ENCOUNTER — Other Ambulatory Visit (HOSPITAL_COMMUNITY): Payer: Self-pay

## 2021-02-11 LAB — TOXASSURE SELECT,+ANTIDEPR,UR

## 2021-02-12 ENCOUNTER — Telehealth: Payer: Self-pay | Admitting: *Deleted

## 2021-02-12 NOTE — Telephone Encounter (Signed)
Urine drug screen for this encounter is consistent for prescribed medication 

## 2021-02-16 ENCOUNTER — Other Ambulatory Visit: Payer: Self-pay | Admitting: *Deleted

## 2021-02-16 DIAGNOSIS — Z79899 Other long term (current) drug therapy: Secondary | ICD-10-CM

## 2021-02-16 DIAGNOSIS — Z9225 Personal history of immunosupression therapy: Secondary | ICD-10-CM

## 2021-02-16 DIAGNOSIS — Z111 Encounter for screening for respiratory tuberculosis: Secondary | ICD-10-CM

## 2021-02-18 ENCOUNTER — Other Ambulatory Visit: Payer: Self-pay | Admitting: *Deleted

## 2021-02-18 DIAGNOSIS — Z79899 Other long term (current) drug therapy: Secondary | ICD-10-CM

## 2021-03-03 ENCOUNTER — Other Ambulatory Visit (HOSPITAL_COMMUNITY): Payer: Self-pay

## 2021-03-04 ENCOUNTER — Other Ambulatory Visit (HOSPITAL_COMMUNITY): Payer: Self-pay

## 2021-03-04 ENCOUNTER — Other Ambulatory Visit: Payer: Self-pay | Admitting: Physician Assistant

## 2021-03-04 DIAGNOSIS — M0609 Rheumatoid arthritis without rheumatoid factor, multiple sites: Secondary | ICD-10-CM

## 2021-03-04 MED ORDER — RASUVO 20 MG/0.4ML ~~LOC~~ SOAJ
20.0000 mg | SUBCUTANEOUS | 2 refills | Status: DC
  Filled 2021-03-04: qty 1.6, 28d supply, fill #0
  Filled 2021-03-30: qty 1.6, 28d supply, fill #1
  Filled 2021-06-10: qty 1.6, 28d supply, fill #2

## 2021-03-04 NOTE — Telephone Encounter (Signed)
Next Visit: 06/22/2021 ? ?Last Visit: 01/19/2021 ? ?Last Fill: 12/10/2020 ? ?DX: Rheumatoid arthritis of multiple sites with negative rheumatoid factor  ? ?Current Dose per office note 01/19/2021: Rasuvo 20 mg subcutaneous injections once weekly ? ?Labs: 12/04/2020 Glucose is elevated at 355 and hemoglobin is low.  Hemoglobin is improving.  ? ?Okay to refill Rasuvo?  ?

## 2021-03-05 ENCOUNTER — Other Ambulatory Visit (HOSPITAL_COMMUNITY): Payer: Self-pay

## 2021-03-05 ENCOUNTER — Telehealth: Payer: Self-pay

## 2021-03-05 NOTE — Telephone Encounter (Signed)
Patient's daughter is calling to get a refill on Hydrocodone. Per PMP, last fill was 01/01/21 ?

## 2021-03-08 ENCOUNTER — Other Ambulatory Visit (HOSPITAL_COMMUNITY): Payer: Self-pay

## 2021-03-08 MED ORDER — HYDROCODONE-ACETAMINOPHEN 5-325 MG PO TABS: 1.0000 | ORAL_TABLET | Freq: Four times a day (QID) | ORAL | 0 refills | Status: DC | PRN

## 2021-03-08 NOTE — Telephone Encounter (Signed)
Notified of refills. °

## 2021-03-08 NOTE — Addendum Note (Signed)
Addended by: Genice RougeLOVORN, Heriberto Stmartin on: 03/08/2021 10:18 AM ? ? Modules accepted: Orders ? ?

## 2021-03-29 ENCOUNTER — Other Ambulatory Visit: Payer: Self-pay | Admitting: *Deleted

## 2021-03-29 DIAGNOSIS — Z79899 Other long term (current) drug therapy: Secondary | ICD-10-CM

## 2021-03-30 ENCOUNTER — Other Ambulatory Visit (HOSPITAL_COMMUNITY): Payer: Self-pay

## 2021-03-30 ENCOUNTER — Other Ambulatory Visit: Payer: Self-pay | Admitting: Physician Assistant

## 2021-03-30 DIAGNOSIS — M0609 Rheumatoid arthritis without rheumatoid factor, multiple sites: Secondary | ICD-10-CM

## 2021-03-30 MED ORDER — HUMIRA (2 PEN) 40 MG/0.4ML ~~LOC~~ AJKT
40.0000 mg | AUTO-INJECTOR | SUBCUTANEOUS | 0 refills | Status: DC
  Filled 2021-03-30 – 2021-05-14 (×4): qty 2, 28d supply, fill #0
  Filled 2021-06-10: qty 2, 28d supply, fill #1
  Filled 2021-06-30: qty 2, 28d supply, fill #2

## 2021-03-30 NOTE — Telephone Encounter (Signed)
Next Visit: 06/22/2021 ? ?Last Visit: 01/19/2021 ? ?Last Fill: 01/11/2021 ? ?DX: Rheumatoid arthritis of multiple sites with negative rheumatoid factor  ? ?Current Dose per office note 01/19/2021: Humira 40 mg subcutaneous injections every 14 days. ? ?Labs: 03/29/2021, Glucose is elevated-214.  Rest of CMP WNL.  Absolute eosinophils are borderline elevated but stable. Rest of CBC WNL.  We will continue to monitor.  ? ?TB Gold: 03/29/2021 in process  ? ?Okay to refill Humira? ? ?

## 2021-03-31 ENCOUNTER — Other Ambulatory Visit (HOSPITAL_COMMUNITY): Payer: Self-pay

## 2021-04-01 ENCOUNTER — Other Ambulatory Visit (HOSPITAL_COMMUNITY): Payer: Self-pay

## 2021-04-02 LAB — COMPLETE METABOLIC PANEL WITH GFR
AG Ratio: 1.2 (calc) (ref 1.0–2.5)
ALT: 16 U/L (ref 6–29)
AST: 17 U/L (ref 10–35)
Albumin: 3.9 g/dL (ref 3.6–5.1)
Alkaline phosphatase (APISO): 88 U/L (ref 37–153)
BUN: 24 mg/dL (ref 7–25)
CO2: 25 mmol/L (ref 20–32)
Calcium: 9.3 mg/dL (ref 8.6–10.4)
Chloride: 104 mmol/L (ref 98–110)
Creat: 0.69 mg/dL (ref 0.50–1.05)
Globulin: 3.3 g/dL (calc) (ref 1.9–3.7)
Glucose, Bld: 214 mg/dL — ABNORMAL HIGH (ref 65–99)
Potassium: 4.5 mmol/L (ref 3.5–5.3)
Sodium: 139 mmol/L (ref 135–146)
Total Bilirubin: 0.4 mg/dL (ref 0.2–1.2)
Total Protein: 7.2 g/dL (ref 6.1–8.1)
eGFR: 96 mL/min/{1.73_m2} (ref >=60)

## 2021-04-02 LAB — CBC WITH DIFFERENTIAL/PLATELET
Absolute Monocytes: 422 cells/uL (ref 200–950)
Basophils Absolute: 61 cells/uL (ref 0–200)
Basophils Relative: 0.9 %
Eosinophils Absolute: 707 cells/uL — ABNORMAL HIGH (ref 15–500)
Eosinophils Relative: 10.4 %
HCT: 36.6 % (ref 35.0–45.0)
Hemoglobin: 11.8 g/dL (ref 11.7–15.5)
Lymphs Abs: 986 cells/uL (ref 850–3900)
MCH: 30.6 pg (ref 27.0–33.0)
MCHC: 32.2 g/dL (ref 32.0–36.0)
MCV: 95.1 fL (ref 80.0–100.0)
MPV: 10.7 fL (ref 7.5–12.5)
Monocytes Relative: 6.2 %
Neutro Abs: 4624 cells/uL (ref 1500–7800)
Neutrophils Relative %: 68 %
Platelets: 231 10*3/uL (ref 140–400)
RBC: 3.85 10*6/uL (ref 3.80–5.10)
RDW: 14.3 % (ref 11.0–15.0)
Total Lymphocyte: 14.5 %
WBC: 6.8 10*3/uL (ref 3.8–10.8)

## 2021-04-02 LAB — QUANTIFERON-TB GOLD PLUS
Mitogen-NIL: 9.05 IU/mL
NIL: 0.03 IU/mL
QuantiFERON-TB Gold Plus: NEGATIVE
TB1-NIL: 0.01 IU/mL
TB2-NIL: 0.01 IU/mL

## 2021-04-02 NOTE — Progress Notes (Signed)
TB gold negative

## 2021-04-06 ENCOUNTER — Telehealth: Payer: Self-pay | Admitting: *Deleted

## 2021-04-06 NOTE — Telephone Encounter (Signed)
Connie Ross called for a refill on her mothers hydrocodone. Last fill date was 03/08/21. ?

## 2021-04-07 MED ORDER — HYDROCODONE-ACETAMINOPHEN 5-325 MG PO TABS: 1.0000 | ORAL_TABLET | Freq: Four times a day (QID) | ORAL | 0 refills | Status: DC | PRN

## 2021-04-12 ENCOUNTER — Other Ambulatory Visit (HOSPITAL_COMMUNITY): Payer: Self-pay

## 2021-04-23 ENCOUNTER — Other Ambulatory Visit (HOSPITAL_COMMUNITY): Payer: Self-pay

## 2021-04-26 ENCOUNTER — Other Ambulatory Visit (HOSPITAL_COMMUNITY): Payer: Self-pay

## 2021-04-27 ENCOUNTER — Other Ambulatory Visit (HOSPITAL_COMMUNITY): Payer: Self-pay

## 2021-04-27 ENCOUNTER — Telehealth: Payer: Self-pay

## 2021-04-27 NOTE — Telephone Encounter (Signed)
Received notification from [facility] stating that pt's funding has run out for her Humira copay card. The pt's daughter called Denyse Amass and was informed that they needed to speak with someone at the office. This is not normally the standard procedure for funding issues.  ? ?Reached out to our [facility] with [employer] for additional assistance, LVM and will await return call. ?

## 2021-04-29 ENCOUNTER — Other Ambulatory Visit (HOSPITAL_COMMUNITY): Payer: Self-pay

## 2021-04-29 NOTE — Telephone Encounter (Signed)
Received update from [facility], turns out that the numbers that were provided to the patient were incorrect. ATC pt's daughter but had to LVM. Correct number provided [phone]) as well as step by step directions. Requested they reach out to clinic with any additional questions they may have. Will await f/u. ?

## 2021-05-05 ENCOUNTER — Telehealth: Payer: Self-pay

## 2021-05-05 NOTE — Telephone Encounter (Signed)
Patient's daughter is calling in for her refill on Hydrocodone. She stated it is due on Saturday. She is a Dr. Dagoberto Ligas patient. Per PMP, last fill was 04/07/21 ?

## 2021-05-06 MED ORDER — HYDROCODONE-ACETAMINOPHEN 5-325 MG PO TABS: 1.0000 | ORAL_TABLET | Freq: Four times a day (QID) | ORAL | 0 refills | Status: DC | PRN

## 2021-05-06 NOTE — Telephone Encounter (Signed)
PMP was Reviewed.  ?Hydrocodone e-scribed today.  ?GrenadaBrittany placed a call to daughter , Suzette BattiestVeronica, regarding the above  ?Riley Lamunice  ?

## 2021-05-08 ENCOUNTER — Other Ambulatory Visit (HOSPITAL_COMMUNITY): Payer: Self-pay

## 2021-05-10 ENCOUNTER — Other Ambulatory Visit (HOSPITAL_COMMUNITY): Payer: Self-pay

## 2021-05-11 ENCOUNTER — Other Ambulatory Visit (HOSPITAL_COMMUNITY): Payer: Self-pay

## 2021-05-12 ENCOUNTER — Other Ambulatory Visit (HOSPITAL_COMMUNITY): Payer: Self-pay

## 2021-05-13 ENCOUNTER — Other Ambulatory Visit (HOSPITAL_COMMUNITY): Payer: Self-pay

## 2021-05-14 ENCOUNTER — Encounter: Payer: BC Managed Care – PPO | Admitting: Physical Medicine and Rehabilitation

## 2021-05-14 ENCOUNTER — Other Ambulatory Visit (HOSPITAL_COMMUNITY): Payer: Self-pay

## 2021-05-17 ENCOUNTER — Other Ambulatory Visit (HOSPITAL_COMMUNITY): Payer: Self-pay

## 2021-05-19 ENCOUNTER — Other Ambulatory Visit (HOSPITAL_COMMUNITY): Payer: Self-pay

## 2021-05-19 NOTE — Telephone Encounter (Signed)
Per dispensing history, [facility] dispensed Humira on 05/14/21. Will close encounter ? ?Knox Saliva, PharmD, MPH, BCPS, CPP ?Clinical Pharmacist (Rheumatology and Pulmonology) ?

## 2021-05-22 DIAGNOSIS — M069 Rheumatoid arthritis, unspecified: Secondary | ICD-10-CM | POA: Diagnosis not present

## 2021-05-22 DIAGNOSIS — I83811 Varicose veins of right lower extremities with pain: Secondary | ICD-10-CM | POA: Diagnosis not present

## 2021-05-22 DIAGNOSIS — M79661 Pain in right lower leg: Secondary | ICD-10-CM | POA: Diagnosis not present

## 2021-05-22 DIAGNOSIS — I872 Venous insufficiency (chronic) (peripheral): Secondary | ICD-10-CM | POA: Diagnosis not present

## 2021-05-22 DIAGNOSIS — E1165 Type 2 diabetes mellitus with hyperglycemia: Secondary | ICD-10-CM | POA: Diagnosis not present

## 2021-05-22 DIAGNOSIS — I10 Essential (primary) hypertension: Secondary | ICD-10-CM | POA: Diagnosis not present

## 2021-05-22 DIAGNOSIS — E785 Hyperlipidemia, unspecified: Secondary | ICD-10-CM | POA: Diagnosis not present

## 2021-06-02 ENCOUNTER — Telehealth: Payer: Self-pay | Admitting: Pharmacy Technician

## 2021-06-02 ENCOUNTER — Encounter
Payer: BC Managed Care – PPO | Attending: Physical Medicine and Rehabilitation | Admitting: Physical Medicine and Rehabilitation

## 2021-06-02 ENCOUNTER — Encounter: Payer: Self-pay | Admitting: Physical Medicine and Rehabilitation

## 2021-06-02 VITALS — BP 144/75 | HR 77 | Ht 61.0 in | Wt 207.8 lb

## 2021-06-02 DIAGNOSIS — M5116 Intervertebral disc disorders with radiculopathy, lumbar region: Secondary | ICD-10-CM | POA: Insufficient documentation

## 2021-06-02 DIAGNOSIS — M0609 Rheumatoid arthritis without rheumatoid factor, multiple sites: Secondary | ICD-10-CM | POA: Diagnosis not present

## 2021-06-02 DIAGNOSIS — Z96653 Presence of artificial knee joint, bilateral: Secondary | ICD-10-CM | POA: Insufficient documentation

## 2021-06-02 DIAGNOSIS — G894 Chronic pain syndrome: Secondary | ICD-10-CM | POA: Insufficient documentation

## 2021-06-02 MED ORDER — HYDROCODONE-ACETAMINOPHEN 5-325 MG PO TABS: 1.0000 | ORAL_TABLET | Freq: Four times a day (QID) | ORAL | 0 refills | Status: DC | PRN

## 2021-06-02 NOTE — Progress Notes (Signed)
Subjective:    Patient ID: Connie BirchwoodEleuteria Ross, female    DOB: 12/21/1955, 66 y.o.   MRN: [id number]  HPI    Pt is a 66 yr old female with RA  on Humira, chronic low back pain, HTN, DM- B/L knee replacement here for f/u on chronic pain.     Is due for pain meds.   Pain is doing a little worse.   Had a few falls-  Hit head and fell in her kitchen on tile floor-  Hurt R leg- has seen her PCP-  last week- he prescribed lidocaine patches- but she hasn't gotten yet.  She can bear weight on R leg- struggling to move around more.  Walking more bent over and doesn't go outside like used to- world has closed down more.  Cannot cook as much as she used to and cannot lift heavy pots/water anymore.   Has had 2 falls since last seen me in 2/23- and 4 falls total since December 2022.   Taking Cymbalta every other day.    Pain Inventory Average Pain 8 Pain Right Now 9 My pain is constant and aching  In the last 24 hours, has pain interfered with the following? General activity 10 Relation with others 7 Enjoyment of life 7 What TIME of day is your pain at its worst? evening and night Sleep (in general) Poor  Pain is worse with: walking, standing, and some activites Pain improves with: medication Relief from Meds: 5  Family History  Adopted: Yes  Problem Relation Age of Onset   Cancer Mother        breast   Healthy Son    Healthy Daughter    Healthy Daughter    Social History   Socioeconomic History   Marital status: Married    Spouse name: Not on file   Number of children: Not on file   Years of education: Not on file   Highest education level: Not on file  Occupational History   Not on file  Tobacco Use   Smoking status: Never   Smokeless tobacco: Never  Vaping Use   Vaping Use: Never used  Substance and Sexual Activity   Alcohol use: No   Drug use: No   Sexual activity: Not Currently  Other Topics Concern   Not on file  Social History Narrative   Not  on file   Social Determinants of Health   Financial Resource Strain: Not on file  Food Insecurity: Not on file  Transportation Needs: Not on file  Physical Activity: Not on file  Stress: Not on file  Social Connections: Not on file   Past Surgical History:  Procedure Laterality Date   EYE SURGERY     bilateral cataracts   KNEE ARTHROPLASTY     TOTAL KNEE ARTHROPLASTY Right 02/28/2013   TOTAL KNEE ARTHROPLASTY Right 02/27/2013   Procedure: TOTAL KNEE ARTHROPLASTY- knee;  Surgeon: Nadara MustardMarcus V Duda, MD;  Location: [facility] OR;  Service: Orthopedics;  Laterality: Right;  Right Total Knee Arthroplasty   TOTAL KNEE ARTHROPLASTY Left 07/04/2014   Procedure: LEFT TOTAL KNEE ARTHROPLASTY;  Surgeon: Nadara MustardMarcus Duda V, MD;  Location: [facility];  Service: Orthopedics;  Laterality: Left;   Past Surgical History:  Procedure Laterality Date   EYE SURGERY     bilateral cataracts   KNEE ARTHROPLASTY     TOTAL KNEE ARTHROPLASTY Right 02/28/2013   TOTAL KNEE ARTHROPLASTY Right 02/27/2013   Procedure: TOTAL KNEE ARTHROPLASTY- knee;  Surgeon: Nadara MustardMarcus V Duda, MD;  Location: [facility];  Service: Orthopedics;  Laterality: Right;  Right Total Knee Arthroplasty   TOTAL KNEE ARTHROPLASTY Left 07/04/2014   Procedure: LEFT TOTAL KNEE ARTHROPLASTY;  Surgeon: Nadara Mustard, MD;  Location: [facility];  Service: Orthopedics;  Laterality: Left;   Past Medical History:  Diagnosis Date   Anxiety    takes no meds   DVT (deep venous thrombosis) (HCC)    GERD (gastroesophageal reflux disease)    High cholesterol    Hypertension    Rheumatoid arthritis (HCC)    Type II diabetes mellitus (HCC)    BP (!) 144/75   Pulse 77   Ht 5\' 1"  (1.549 m)   Wt 207 lb 12.8 oz (94.3 kg)   LMP  (LMP Unknown)   SpO2 91%   BMI 39.26 kg/m   Opioid Risk Score:   Fall Risk Score:  `1  Depression screen [facility] 2/9     06/02/2021    1:30 PM 02/05/2021   11:03 AM 07/27/2020   10:41 AM 05/25/2020    2:38 PM  Depression screen PHQ 2/9  Decreased Interest 0 0 0  0  Down, Depressed, Hopeless 0 0 0 0  PHQ - 2 Score 0 0 0 0  Altered sleeping    0  Tired, decreased energy    0  Change in appetite    0  Feeling bad or failure about yourself     0  Trouble concentrating    0  Moving slowly or fidgety/restless    0  Suicidal thoughts    0  PHQ-9 Score    0     Review of Systems  Constitutional: Negative.   HENT: Negative.    Eyes: Negative.   Respiratory: Negative.    Cardiovascular: Negative.   Gastrointestinal: Negative.   Endocrine: Negative.   Genitourinary: Negative.   Musculoskeletal:  Positive for gait problem.  Skin: Negative.   Allergic/Immunologic: Negative.   Neurological:  Positive for headaches.  Hematological: Negative.   Psychiatric/Behavioral: Negative.        Objective:   Physical Exam  Awake, alert; appears more frail from last appointment; sitting on table; accompanied by daughter, NAD Gait- lumbar flexed curled over gait Walking with B/L feet R>L turned out and uses cane as a prop-       Assessment & Plan:     Pt is a 66 yr old female with RA  on Humira, chronic low back pain, HTN, DM- B/L knee replacement here for f/u on chronic pain. In 2017, had moderately severe B/L foraminal narrowing at L5/S1 and L L4/5 formainal narrowing- moderately severe. Has R foot deformity and supposedly cannot get it fixed by Ortho.   On MTX injectable and Humira.   Explained pain meds put her at some higher risk FOR falls- because can delay responses and make her more off balance. Pt doesn't want to decrease pain meds, wants to increase them- feels like Norco helps more than Tramadol-     2. Can take Flexeril every other day at night  3. Takes the Cymbalta nightly-    4. Has had knee replacements- cannot inject her knees.  Pt asking about back injections- last MRI was 8/17-     5. Demonstrated how to use cane correctly- use on Left side and bring forward at same time as R leg- not as third leg.  Uses 2 canes at home-    6. MRI of lumbar spine ordered since walking less distances- falling more  and having more lumbar flexion with gait- will order at [facility]- [address] [place]. Also need Mri to determine if appropriate to get Lumbar ESI/epidural steroid injections.   7. Discussed using different pain meds- however I'm concerned about increasing pain meds right now since had 2 falls since last saw her. Will try increasing Cymbalta and using lidocaine patches- and try to get ESI if possible.  -don't feel comfortable increasing Norco at this time.   8. Ideally like her to use Rolling walker to walk due to impaired balance. Will write for Rolator- 4 wheeled walker-   9. Can take a Tramadol if having a really bad day- but don't want to increase Norco at this time.   10. Refill Noroc- is due 5/325 mg q6 hours prn #120- no refills.   11. F/U in 3 months, but will call [name]- daughter when MRI comes back.    I spent a total of  34  minutes on total care today- >50% coordination of care- due to MRI we discussed and ESI and falls and Rolator as well as pain control

## 2021-06-02 NOTE — Patient Instructions (Signed)
Pt is a 66 yr old female with RA  on Humira, chronic low back pain, HTN, DM- B/L knee replacement here for f/u on chronic pain. In 2017, had moderately severe B/L foraminal narrowing at L5/S1 and L L4/5 formainal narrowing- moderately severe. Has R foot deformity and supposedly cannot get it fixed by Ortho.   On MTX injectable and Humira.   Explained pain meds put her at some higher risk FOR falls- because can delay responses and make her more off balance. Pt doesn't want to decrease pain meds, wants to increase them- feels like Norco helps more than Tramadol-     2. Can take Flexeril every other day at night  3. Takes the Cymbalta nightly-    4. Has had knee replacements- cannot inject her knees.  Pt asking about back injections- last MRI was 8/17-     5. Demonstrated how to use cane correctly- use on Left side and bring forward at same time as R leg- not as third leg.  Uses 2 canes at home-   6. MRI of lumbar spine ordered since walking less distances- falling more and having more lumbar flexion with gait- will order at [facility]. Also need Mri to determine if appropriate to get Lumbar ESI/epidural steroid injections.   7. Discussed using different pain meds- however I'm concerned about increasing pain meds right now since had 2 falls since last saw her. Will try increasing Cymbalta and using lidocaine patches- and try to get ESI if possible.  -don't feel comfortable increasing Norco at this time.   8. Ideally like her to use Rolling walker to walk due to impaired balance. Will write for Rolator- 4 wheeled walker-   9. Can take a Tramadol if having a really bad day- but don't want to increase Norco at this time.   10. Refill Noroc- is due 5/325 mg q6 hours prn #120- no refills.   11. F/U in 3 months, but will call Verdene Lennert- daughter when MRI comes back.

## 2021-06-02 NOTE — Telephone Encounter (Signed)
Received notification from [employer] regarding a prior authorization for RASUVO. Authorization has been APPROVED from 07/03/21 to 07/04/22.   Authorization # [id number]

## 2021-06-10 ENCOUNTER — Other Ambulatory Visit (HOSPITAL_COMMUNITY): Payer: Self-pay

## 2021-06-14 ENCOUNTER — Telehealth: Payer: Self-pay

## 2021-06-14 NOTE — Telephone Encounter (Signed)
[facility]  [facility] a prior authorization for HUMIRA. Authorization has been APPROVED from 07/01/2021 to 07/02/2022.   Updated [employer] with current Prior Authorization information.  Authorization # [id number]

## 2021-06-14 NOTE — Telephone Encounter (Signed)
Per Rema Jasmine, current Prior Authorization is expiring.  Submitted a Prior Authorization request to [employer]  for HUMIRA via CoverMyMeds. Will update once we receive a response.   Key: [id number]

## 2021-06-17 NOTE — Progress Notes (Unsigned)
Office Visit Note  Patient: Connie Ross             Date of Birth: Oct 20, 1955           MRN: [id number]             PCP: Mirna Mires, MD Referring: Mirna Mires, MD Visit Date: 06/22/2021 Occupation: @GUAROCC @  Subjective:  No chief complaint on file.   History of Present Illness: Connie Ross is a 66 y.o. female ***   Activities of Daily Living:  Patient reports morning stiffness for *** {minute/hour:19697}.   Patient {ACTIONS;DENIES/REPORTS:21021675::"Denies"} nocturnal pain.  Difficulty dressing/grooming: {ACTIONS;DENIES/REPORTS:21021675::"Denies"} Difficulty climbing stairs: {ACTIONS;DENIES/REPORTS:21021675::"Denies"} Difficulty getting out of chair: {ACTIONS;DENIES/REPORTS:21021675::"Denies"} Difficulty using hands for taps, buttons, cutlery, and/or writing: {ACTIONS;DENIES/REPORTS:21021675::"Denies"}  No Rheumatology ROS completed.   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 05/25/2020   Chronic bilateral low back pain with bilateral sciatica 05/25/2020   Pain in right foot 04/06/2016   Rheumatoid arthritis of multiple sites with negative rheumatoid factor (HCC) 02/03/2016   High risk medication use 02/03/2016   Osteoarthritis of lumbar spine 02/03/2016   Essential hypertension 02/03/2016   History of diabetes mellitus 02/03/2016   History of diabetic neuropathy 02/03/2016   Dyslipidemia 02/03/2016   Vitamin D deficiency 02/03/2016   Abnormality of gait 03/15/2013   Right knee pain 03/15/2013   History of total knee replacement, bilateral 02/27/2013    Past Medical History:  Diagnosis Date   Anxiety    takes no meds   DVT (deep venous thrombosis) (HCC)    GERD (gastroesophageal reflux disease)    High cholesterol    Hypertension    Rheumatoid arthritis  (HCC)    Type II diabetes mellitus (HCC)     Family History  Adopted: Yes  Problem Relation Age of Onset   Cancer Mother        breast   Healthy Son    Healthy Daughter    Healthy Daughter    Past Surgical History:  Procedure Laterality Date   EYE SURGERY     bilateral cataracts   KNEE ARTHROPLASTY     TOTAL KNEE ARTHROPLASTY Right 02/28/2013   TOTAL KNEE ARTHROPLASTY Right 02/27/2013   Procedure: TOTAL KNEE ARTHROPLASTY- knee;  Surgeon: 03/01/2013, MD;  Location: [facility] OR;  Service: Orthopedics;  Laterality: Right;  Right Total Knee Arthroplasty   TOTAL KNEE ARTHROPLASTY Left 07/04/2014   Procedure: LEFT TOTAL KNEE ARTHROPLASTY;  Surgeon: 09/04/2014, MD;  Location: [facility] OR;  Service: Orthopedics;  Laterality: Left;   Social History   Social History Narrative   Not on file   Immunization History  Administered Date(s) Administered   Influenza-Unspecified 09/03/2012   Moderna Sars-Covid-2 Vaccination 07/25/2019, 08/22/2019   Pneumococcal Polysaccharide-23 03/01/2013     Objective: Vital Signs: LMP  (LMP Unknown)    Physical Exam   Musculoskeletal Exam: ***  CDAI Exam: CDAI Score: -- Patient Global: --; Provider Global: -- Swollen: --; Tender: -- Joint Exam 06/22/2021   No joint exam has been documented for this visit   There is currently no information documented on the homunculus. Go to the Rheumatology activity and complete the homunculus joint exam.  Investigation: No additional findings.  Imaging: No results found.  Recent Labs: Lab Results  Component Value Date   WBC 6.8 03/29/2021  HGB 11.8 03/29/2021   PLT 231 03/29/2021   NA 139 03/29/2021   K 4.5 03/29/2021   CL 104 03/29/2021   CO2 25 03/29/2021   GLUCOSE 214 (H) 03/29/2021   BUN 24 03/29/2021   CREATININE 0.69 03/29/2021   BILITOT 0.4 03/29/2021   ALKPHOS 102 06/20/2016   AST 17 03/29/2021   ALT 16 03/29/2021   PROT 7.2 03/29/2021   ALBUMIN 3.7 06/20/2016   CALCIUM 9.3 03/29/2021    GFRAA 108 06/29/2020   QFTBGOLDPLUS NEGATIVE 03/29/2021    Speciality Comments: No specialty comments available.  Procedures:  No procedures performed Allergies: Patient has no known allergies.   Assessment / Plan:     Visit Diagnoses: No diagnosis found.  Orders: No orders of the defined types were placed in this encounter.  No orders of the defined types were placed in this encounter.   Face-to-face time spent with patient was *** minutes. Greater than 50% of time was spent in counseling and coordination of care.  Follow-Up Instructions: No follow-ups on file.   Pollyann Savoy, MD  Note - This record has been created using Animal nutritionist.  Chart creation errors have been sought, but may not always  have been located. Such creation errors do not reflect on  the standard of medical care.

## 2021-06-19 ENCOUNTER — Ambulatory Visit
Admission: RE | Admit: 2021-06-19 | Discharge: 2021-06-19 | Disposition: A | Discharge status: Home / Self Care | Payer: BC Managed Care – PPO | Source: Ambulatory Visit | Attending: Physical Medicine and Rehabilitation | Admitting: Physical Medicine and Rehabilitation

## 2021-06-19 DIAGNOSIS — M5416 Radiculopathy, lumbar region: Secondary | ICD-10-CM | POA: Diagnosis not present

## 2021-06-19 DIAGNOSIS — M5116 Intervertebral disc disorders with radiculopathy, lumbar region: Secondary | ICD-10-CM

## 2021-06-19 DIAGNOSIS — M5136 Other intervertebral disc degeneration, lumbar region: Secondary | ICD-10-CM | POA: Diagnosis not present

## 2021-06-19 DIAGNOSIS — G894 Chronic pain syndrome: Secondary | ICD-10-CM

## 2021-06-19 DIAGNOSIS — M48061 Spinal stenosis, lumbar region without neurogenic claudication: Secondary | ICD-10-CM | POA: Diagnosis not present

## 2021-06-22 ENCOUNTER — Ambulatory Visit (INDEPENDENT_AMBULATORY_CARE_PROVIDER_SITE_OTHER): Payer: BLUE CROSS/BLUE SHIELD | Admitting: Rheumatology

## 2021-06-22 ENCOUNTER — Encounter: Payer: Self-pay | Admitting: Rheumatology

## 2021-06-22 VITALS — BP 165/77 | HR 84 | Resp 16 | Ht 63.0 in | Wt 209.0 lb

## 2021-06-22 DIAGNOSIS — M0609 Rheumatoid arthritis without rheumatoid factor, multiple sites: Secondary | ICD-10-CM

## 2021-06-22 DIAGNOSIS — Z96653 Presence of artificial knee joint, bilateral: Secondary | ICD-10-CM

## 2021-06-22 DIAGNOSIS — Z79899 Other long term (current) drug therapy: Secondary | ICD-10-CM

## 2021-06-22 DIAGNOSIS — E785 Hyperlipidemia, unspecified: Secondary | ICD-10-CM

## 2021-06-22 DIAGNOSIS — Z8679 Personal history of other diseases of the circulatory system: Secondary | ICD-10-CM

## 2021-06-22 DIAGNOSIS — M5136 Other intervertebral disc degeneration, lumbar region: Secondary | ICD-10-CM

## 2021-06-22 DIAGNOSIS — Z789 Other specified health status: Secondary | ICD-10-CM

## 2021-06-22 DIAGNOSIS — Z8639 Personal history of other endocrine, nutritional and metabolic disease: Secondary | ICD-10-CM

## 2021-06-22 NOTE — Patient Instructions (Signed)
Standing Labs We placed an order today for your standing lab work.   Please have your standing labs drawn in September and every 3 months  If possible, please have your labs drawn 2 weeks prior to your appointment so that the provider can discuss your results at your appointment.  Please note that you may see your imaging and lab results in MyChart before we have reviewed them. We may be awaiting multiple results to interpret others before contacting you. Please allow our office up to 72 hours to thoroughly review all of the results before contacting the office for clarification of your results.  We have open lab daily: Monday through Thursday from 1:30-4:30 PM and Friday from 1:30-4:00 PM at the office of Dr. Boleslaw Borghi, [facility].   Please be advised, all patients with office appointments requiring lab work will take precedent over walk-in lab work.  If possible, please come for your lab work on Monday and Friday afternoons, as you may experience shorter wait times. The office is located at [address], [address], [place], [place] [address] No appointment is necessary.   Labs are drawn by [facility]. Please bring your co-pay at the time of your lab draw.  You may receive a bill from [facility] for your lab work.  Please note if you are on Hydroxychloroquine and and an order has been placed for a Hydroxychloroquine level, you will need to have it drawn 4 hours or more after your last dose.  If you wish to have your labs drawn at another location, please call the office 24 hours in advance to send orders.  If you have any questions regarding directions or hours of operation,  please call [phone].   As a reminder, please drink plenty of water prior to coming for your lab work. Thanks!   Vaccines You are taking a medication(s) that can suppress your immune system.  The following immunizations are recommended: Flu annually Covid-19  Td/Tdap (tetanus, diphtheria,  pertussis) every 10 years Pneumonia (Prevnar 15 then Pneumovax 23 at least 1 year apart.  Alternatively, can take Prevnar 20 without needing additional dose) Shingrix: 2 doses from 4 weeks to 6 months apart  Please check with your PCP to make sure you are up to date.   If you have signs or symptoms of an infection or start antibiotics: First, call your PCP for workup of your infection. Hold your medication through the infection, until you complete your antibiotics, and until symptoms resolve if you take the following: Injectable medication (Actemra, Benlysta, Cimzia, Cosentyx, Enbrel, Humira, Kevzara, Orencia, Remicade, Simponi, Stelara, Taltz, Tremfya) Methotrexate Leflunomide (Arava) Mycophenolate (Cellcept) Xeljanz, Olumiant, or Rinvoq  

## 2021-06-23 LAB — CBC WITH DIFFERENTIAL/PLATELET
Absolute Monocytes: 398 cells/uL (ref 200–950)
Basophils Absolute: 43 cells/uL (ref 0–200)
Basophils Relative: 0.6 %
Eosinophils Absolute: 724 cells/uL — ABNORMAL HIGH (ref 15–500)
Eosinophils Relative: 10.2 %
HCT: 34.2 % — ABNORMAL LOW (ref 35.0–45.0)
Hemoglobin: 11.4 g/dL — ABNORMAL LOW (ref 11.7–15.5)
Lymphs Abs: 1420 cells/uL (ref 850–3900)
MCH: 31.1 pg (ref 27.0–33.0)
MCHC: 33.3 g/dL (ref 32.0–36.0)
MCV: 93.4 fL (ref 80.0–100.0)
MPV: 9.9 fL (ref 7.5–12.5)
Monocytes Relative: 5.6 %
Neutro Abs: 4516 cells/uL (ref 1500–7800)
Neutrophils Relative %: 63.6 %
Platelets: 251 10*3/uL (ref 140–400)
RBC: 3.66 10*6/uL — ABNORMAL LOW (ref 3.80–5.10)
RDW: 13.4 % (ref 11.0–15.0)
Total Lymphocyte: 20 %
WBC: 7.1 10*3/uL (ref 3.8–10.8)

## 2021-06-23 LAB — COMPLETE METABOLIC PANEL WITH GFR
AG Ratio: 1.3 (calc) (ref 1.0–2.5)
ALT: 15 U/L (ref 6–29)
AST: 18 U/L (ref 10–35)
Albumin: 4 g/dL (ref 3.6–5.1)
Alkaline phosphatase (APISO): 88 U/L (ref 37–153)
BUN: 18 mg/dL (ref 7–25)
CO2: 27 mmol/L (ref 20–32)
Calcium: 9.2 mg/dL (ref 8.6–10.4)
Chloride: 106 mmol/L (ref 98–110)
Creat: 0.67 mg/dL (ref 0.50–1.05)
Globulin: 3.1 g/dL (calc) (ref 1.9–3.7)
Glucose, Bld: 140 mg/dL — ABNORMAL HIGH (ref 65–99)
Potassium: 4.1 mmol/L (ref 3.5–5.3)
Sodium: 141 mmol/L (ref 135–146)
Total Bilirubin: 0.3 mg/dL (ref 0.2–1.2)
Total Protein: 7.1 g/dL (ref 6.1–8.1)
eGFR: 96 mL/min/{1.73_m2} (ref >=60)

## 2021-06-25 ENCOUNTER — Telehealth: Payer: Self-pay | Admitting: Pharmacist

## 2021-06-30 ENCOUNTER — Other Ambulatory Visit (HOSPITAL_COMMUNITY): Payer: Self-pay

## 2021-06-30 ENCOUNTER — Other Ambulatory Visit: Payer: Self-pay | Admitting: Physician Assistant

## 2021-06-30 DIAGNOSIS — M0609 Rheumatoid arthritis without rheumatoid factor, multiple sites: Secondary | ICD-10-CM

## 2021-06-30 MED ORDER — RASUVO 20 MG/0.4ML ~~LOC~~ SOAJ
20.0000 mg | SUBCUTANEOUS | 2 refills | Status: DC
  Filled 2021-06-30: qty 1.6, 28d supply, fill #0

## 2021-06-30 NOTE — Telephone Encounter (Signed)
Next Visit: 11/23/2021  Last Visit: 06/22/2021  Last Fill: 03/04/2021  DX:  Rheumatoid arthritis of multiple sites with negative rheumatoid factor   Current Dose per office note 06/22/2021: Rasuvo 20 mg subcutaneous injections once weekly  Labs: 06/22/2021 Glucose is 140.  Rest of CMP WNL. RBC count, hgb, and hct are borderline low but stable.  Absolute eosinophils are slightly elevated but stable. Rest of CBC WNL.   Okay to refill Rasuvo?

## 2021-06-30 NOTE — Telephone Encounter (Signed)
Per prior authorization response, there is already an authorization on file through 07/04/22  Chesley Mires, PharmD, MPH, BCPS, CPP Clinical Pharmacist (Rheumatology and Pulmonology)

## 2021-07-01 ENCOUNTER — Other Ambulatory Visit: Payer: Self-pay

## 2021-07-01 MED ORDER — HYDROCODONE-ACETAMINOPHEN 5-325 MG PO TABS: 1.0000 | ORAL_TABLET | Freq: Four times a day (QID) | ORAL | 0 refills | Status: DC | PRN

## 2021-07-07 ENCOUNTER — Other Ambulatory Visit (HOSPITAL_COMMUNITY): Payer: Self-pay

## 2021-07-07 ENCOUNTER — Telehealth: Payer: Self-pay

## 2021-07-07 DIAGNOSIS — M0609 Rheumatoid arthritis without rheumatoid factor, multiple sites: Secondary | ICD-10-CM

## 2021-07-07 DIAGNOSIS — Z79899 Other long term (current) drug therapy: Secondary | ICD-10-CM

## 2021-07-07 NOTE — Telephone Encounter (Signed)
Received notification from [facility] that pt's Rasuvo was rejecting. Upon further inspection Rasuvo is no longer formulary and Otrexup is now preferred. No additional PA is required, however we will need to work on obtaining a copay card for pt.

## 2021-07-08 ENCOUNTER — Other Ambulatory Visit (HOSPITAL_COMMUNITY): Payer: Self-pay

## 2021-07-08 MED ORDER — OTREXUP 20 MG/0.4ML ~~LOC~~ SOAJ
20.0000 mg | SUBCUTANEOUS | 2 refills | Status: DC
  Filled 2021-07-08 (×2): qty 1.6, 28d supply, fill #0
  Filled 2021-07-28: qty 1.6, 28d supply, fill #1

## 2021-07-08 NOTE — Telephone Encounter (Signed)
Rx for Otrexup 20mg  weekly sent to [facility] with copay card information  Most recent CBC, CMP on 06/22/21 - Glucose is 140.  Rest of CMP WNL. RBC count, hgb, and hct are borderline low but stable.  Absolute eosinophils are slightly elevated but stable. Rest of CBC WNL  TB gold update on 03/29/21  03/31/21, PharmD, MPH, BCPS, CPP Clinical Pharmacist (Rheumatology and Pulmonology)

## 2021-07-08 NOTE — Telephone Encounter (Signed)
Enrolled pt into Otrexup copay card program, copay card info added to pt's chart in [facility]. Copy of card also sent to scan center.

## 2021-07-09 ENCOUNTER — Other Ambulatory Visit (HOSPITAL_COMMUNITY): Payer: Self-pay

## 2021-07-26 ENCOUNTER — Telehealth: Payer: Self-pay | Admitting: Pharmacy Technician

## 2021-07-26 NOTE — Telephone Encounter (Signed)
ERROR

## 2021-07-28 ENCOUNTER — Other Ambulatory Visit (HOSPITAL_COMMUNITY): Payer: Self-pay

## 2021-07-28 ENCOUNTER — Other Ambulatory Visit: Payer: Self-pay | Admitting: Physician Assistant

## 2021-07-28 DIAGNOSIS — M0609 Rheumatoid arthritis without rheumatoid factor, multiple sites: Secondary | ICD-10-CM

## 2021-07-28 MED ORDER — HUMIRA (2 PEN) 40 MG/0.4ML ~~LOC~~ AJKT
40.0000 mg | AUTO-INJECTOR | SUBCUTANEOUS | 0 refills | Status: DC
  Filled 2021-07-28: qty 2, 28d supply, fill #0

## 2021-07-28 NOTE — Telephone Encounter (Signed)
Next Visit: 11/23/2021   Last Visit: 06/22/2021   Last Fill: 03/30/2021  DX:  Rheumatoid arthritis of multiple sites with negative rheumatoid factor    Current Dose per office note 06/22/2021: Humira 40 mg subcutaneous injections every 14 days  Labs: 06/22/2021 Glucose is 140.  Rest of CMP WNL. RBC count, hgb, and hct are borderline low but stable.  Absolute eosinophils are slightly elevated but stable. Rest of CBC WNL.   TB Gold: 03/29/2021 Neg   Okay to refill Humira?

## 2021-07-29 ENCOUNTER — Other Ambulatory Visit (HOSPITAL_COMMUNITY): Payer: Self-pay

## 2021-07-29 ENCOUNTER — Encounter
Payer: BC Managed Care – PPO | Attending: Physical Medicine and Rehabilitation | Admitting: Physical Medicine & Rehabilitation

## 2021-07-29 ENCOUNTER — Encounter: Payer: Self-pay | Admitting: Physical Medicine & Rehabilitation

## 2021-07-29 VITALS — Ht 63.0 in | Wt 202.8 lb

## 2021-07-29 DIAGNOSIS — M5116 Intervertebral disc disorders with radiculopathy, lumbar region: Secondary | ICD-10-CM | POA: Insufficient documentation

## 2021-07-29 MED ORDER — HYDROCODONE-ACETAMINOPHEN 5-325 MG PO TABS: 1.0000 | ORAL_TABLET | Freq: Four times a day (QID) | ORAL | 0 refills | Status: DC | PRN

## 2021-07-29 NOTE — Progress Notes (Signed)
Right S1 transforaminal epidural steroid injection under fluoroscopic guidance with contrast enhancement  Indication: Lumbosacral radiculitis is not relieved by medication management or other conservative care and interfering with self-care and mobility.   Informed consent was obtained after describing risk and benefits of the procedure with the patient, this includes bleeding, bruising, infection, paralysis and medication side effects.  The patient wishes to proceed and has given written consent.  Patient was placed in prone position.  The lumbar area was marked and prepped with Betadine.  It was entered with a 25-gauge 1-1/2 inch needle and one mL of 1% lidocaine was injected into the skin and subcutaneous tissue.  Then a 22-gauge 5" spinal needle was inserted into the Right S1 foramen  intervertebral foramen under AP, lateral, and oblique view.  Once needle tip was within the foramen on lateral views an dnor exceeding 6 o clock position on th epedical on AP viewed Isovue 200 was inected x 2ml Then a solution containing one mL of 10 mg per mL dexamethasone and 2 mL of 1% lidocaine was injected.  The patient tolerated procedure well.  Post procedure instructions were given.  Please see post procedure form.  

## 2021-07-29 NOTE — Patient Instructions (Signed)

## 2021-07-29 NOTE — Progress Notes (Signed)
  PROCEDURE RECORD [facility]   Name: Connie Ross DOB:[birth year] MRN: [id number]  Date:07/29/2021  Physician: Claudette Laws, MD    Nurse/CMA: Nedra Hai, CMA  Allergies: No Known Allergies  Consent Signed: Yes.    Is patient diabetic? Yes.    CBG today? 138  Pregnant: No. LMP: No LMP recorded (lmp unknown). Patient is postmenopausal. (age [age])  Anticoagulants: no Anti-inflammatory: no Antibiotics: no  Procedure: S1 Transforaminal Epidural Steroid Injection  Position: Prone Start Time: 11:35 am  End Time: 11:41 am  Fluoro Time: 30  RN/CMA Nedra Hai, CMA [name],CMA    Time 11:17 am 11:50 am    BP 175/90 177/94    Pulse 81 80    Respirations 16 16    O2 Sat 90 90    S/S 6 6    Pain Level 7/10 0/10     D/C home with daughter Connie Ross, patient A & O X 3, D/C instructions reviewed, and sits independently.

## 2021-07-30 ENCOUNTER — Other Ambulatory Visit (HOSPITAL_COMMUNITY): Payer: Self-pay

## 2021-08-01 ENCOUNTER — Other Ambulatory Visit: Payer: Self-pay | Admitting: Physical Medicine and Rehabilitation

## 2021-08-04 ENCOUNTER — Other Ambulatory Visit (HOSPITAL_COMMUNITY): Payer: Self-pay

## 2021-08-04 ENCOUNTER — Other Ambulatory Visit: Payer: Self-pay | Admitting: Pharmacist

## 2021-08-04 DIAGNOSIS — Z79899 Other long term (current) drug therapy: Secondary | ICD-10-CM

## 2021-08-04 DIAGNOSIS — M0609 Rheumatoid arthritis without rheumatoid factor, multiple sites: Secondary | ICD-10-CM

## 2021-08-04 MED ORDER — OTREXUP 20 MG/0.4ML ~~LOC~~ SOAJ
20.0000 mg | SUBCUTANEOUS | 0 refills | Status: DC
  Filled 2021-08-04 – 2021-08-17 (×2): qty 1.6, 28d supply, fill #0

## 2021-08-04 MED ORDER — HUMIRA (2 PEN) 40 MG/0.4ML ~~LOC~~ AJKT
40.0000 mg | AUTO-INJECTOR | SUBCUTANEOUS | 1 refills | Status: DC
  Filled 2021-08-04 – 2021-08-17 (×2): qty 2, 28d supply, fill #0
  Filled 2021-09-15: qty 2, 28d supply, fill #1

## 2021-08-04 NOTE — Telephone Encounter (Signed)
Rx for Otrexup 20mg  weekly and Humira 40mg  every 14 days sent to [facility] for hospital-based cost pricing.   Total qty remaining is 4 pens of Humira and and 1.42ml of Otrexup  [facility], PharmD, MPH, BCPS, CPP Clinical Pharmacist (Rheumatology and Pulmonology)

## 2021-08-12 ENCOUNTER — Other Ambulatory Visit: Payer: Self-pay | Admitting: Physical Medicine and Rehabilitation

## 2021-08-17 ENCOUNTER — Other Ambulatory Visit (HOSPITAL_COMMUNITY): Payer: Self-pay

## 2021-08-26 ENCOUNTER — Other Ambulatory Visit (HOSPITAL_COMMUNITY): Payer: Self-pay

## 2021-08-30 ENCOUNTER — Telehealth: Payer: Self-pay

## 2021-08-30 MED ORDER — HYDROCODONE-ACETAMINOPHEN 5-325 MG PO TABS: 1.0000 | ORAL_TABLET | Freq: Four times a day (QID) | ORAL | 0 refills | Status: DC | PRN

## 2021-08-30 NOTE — Telephone Encounter (Signed)
Patient daughter called in requesting refiill for hydrocodone

## 2021-09-15 ENCOUNTER — Other Ambulatory Visit (HOSPITAL_COMMUNITY): Payer: Self-pay

## 2021-09-15 ENCOUNTER — Other Ambulatory Visit: Payer: Self-pay | Admitting: Rheumatology

## 2021-09-15 DIAGNOSIS — Z79899 Other long term (current) drug therapy: Secondary | ICD-10-CM

## 2021-09-15 DIAGNOSIS — M0609 Rheumatoid arthritis without rheumatoid factor, multiple sites: Secondary | ICD-10-CM

## 2021-09-15 MED ORDER — OTREXUP 20 MG/0.4ML ~~LOC~~ SOAJ
20.0000 mg | SUBCUTANEOUS | 0 refills | Status: DC
  Filled 2021-09-23: qty 1.6, 28d supply, fill #0

## 2021-09-15 NOTE — Telephone Encounter (Signed)
Next Visit: 11/23/2021   Last Visit: 06/22/2021   Last Fill: 08/04/2021 (30 day supply)  DX:  Rheumatoid arthritis of multiple sites with negative rheumatoid factor    Current Dose per office note 06/22/2021: Rasuvo 20 mg subcutaneous injections once weekly  Labs: 06/22/2021 Glucose is 140.  Rest of CMP WNL. RBC count, hgb, and hct are borderline low but stable.  Absolute eosinophils are slightly elevated but stable. Rest of CBC WNL.   Okay to refill Otrexup?

## 2021-09-23 ENCOUNTER — Other Ambulatory Visit (HOSPITAL_COMMUNITY): Payer: Self-pay

## 2021-09-24 ENCOUNTER — Other Ambulatory Visit (HOSPITAL_COMMUNITY): Payer: Self-pay

## 2021-09-27 ENCOUNTER — Other Ambulatory Visit (HOSPITAL_COMMUNITY): Payer: Self-pay

## 2021-09-29 ENCOUNTER — Other Ambulatory Visit (HOSPITAL_COMMUNITY): Payer: Self-pay

## 2021-09-30 ENCOUNTER — Telehealth: Payer: Self-pay | Admitting: Physical Medicine and Rehabilitation

## 2021-09-30 MED ORDER — HYDROCODONE-ACETAMINOPHEN 5-325 MG PO TABS: 1.0000 | ORAL_TABLET | Freq: Four times a day (QID) | ORAL | 0 refills | Status: DC | PRN

## 2021-09-30 NOTE — Telephone Encounter (Signed)
Patient needs a refill on her hydrocodone  

## 2021-10-01 ENCOUNTER — Encounter: Payer: BC Managed Care – PPO | Admitting: Physical Medicine and Rehabilitation

## 2021-10-02 ENCOUNTER — Other Ambulatory Visit: Payer: Self-pay | Admitting: Rheumatology

## 2021-10-04 NOTE — Telephone Encounter (Signed)
Next Visit: 11/23/2021  Last Visit: 06/22/2021  Last Fill: 06/29/2020  DX: Rheumatoid arthritis of multiple sites with negative rheumatoid factor  Current Dose per office note on 5/36/6440: folic acid 2 mg by mouth daily  Okay to refill folic acid?

## 2021-10-06 ENCOUNTER — Encounter
Payer: BC Managed Care – PPO | Attending: Physical Medicine and Rehabilitation | Admitting: Physical Medicine and Rehabilitation

## 2021-10-06 ENCOUNTER — Encounter: Payer: Self-pay | Admitting: Physical Medicine and Rehabilitation

## 2021-10-06 VITALS — BP 127/69 | HR 82 | Ht 63.0 in | Wt 195.2 lb

## 2021-10-06 DIAGNOSIS — G894 Chronic pain syndrome: Secondary | ICD-10-CM | POA: Diagnosis not present

## 2021-10-06 DIAGNOSIS — Z5181 Encounter for therapeutic drug level monitoring: Secondary | ICD-10-CM | POA: Diagnosis not present

## 2021-10-06 DIAGNOSIS — M0609 Rheumatoid arthritis without rheumatoid factor, multiple sites: Secondary | ICD-10-CM | POA: Insufficient documentation

## 2021-10-06 DIAGNOSIS — M5116 Intervertebral disc disorders with radiculopathy, lumbar region: Secondary | ICD-10-CM | POA: Diagnosis not present

## 2021-10-06 DIAGNOSIS — Z79891 Long term (current) use of opiate analgesic: Secondary | ICD-10-CM | POA: Insufficient documentation

## 2021-10-06 MED ORDER — HYDROCODONE-ACETAMINOPHEN 7.5-325 MG PO TABS: 1.0000 | ORAL_TABLET | Freq: Four times a day (QID) | ORAL | 0 refills | Status: DC | PRN

## 2021-10-06 NOTE — Progress Notes (Signed)
Subjective:    Patient ID: Connie Ross, female    DOB: 12-22-1955, 66 y.o.   MRN: [id number]  HPI Pt is a 66 yr old female with RA  on Humira, chronic low back pain, HTN, DM- B/L knee replacement here for f/u on chronic pain. In 2017, had moderately severe B/L foraminal narrowing at L5/S1 and L L4/5 formainal narrowing- moderately severe. Has R foot deformity and supposedly cannot get it fixed by Ortho.    On MTX injectable and Humira. Here for f/u on chornic pain from lumbar radiculopathy and RA.   Doing not great Had epidural steroid injection by Dr Letta Pate on 07/29/21   Walking with more pain and back is really hurting.  Very difficult time.  Still taking Norco 4x/day And Flexeril only 1 tab QHS- since makes sleepy.    MRI lumbar spine 06/19/21 Multilevel degenerative changes of the lumbar spine, overall similar to prior exam.   L4-L5: Moderate spinal canal stenosis with right greater than left subarticular stenosis and potential for impingement of the descending right L5 nerve root. Moderate-severe bilateral neural foraminal stenosis, unchanged from prior.   L5-S1: Grade 1 degenerative anterolisthesis, similar to prior with advanced bilateral facet arthropathy. Moderate-severe bilateral neural foraminal stenosis, unchanged from prior.  Pain Inventory Average Pain 9 Pain Right Now 6 My pain is constant and aching  In the last 24 hours, has pain interfered with the following? General activity 8 Relation with others 8 Enjoyment of life 5 What TIME of day is your pain at its worst? night Sleep (in general) Fair  Pain is worse with: walking, bending, and standing Pain improves with: medication Relief from Meds: 7  Family History  Adopted: Yes  Problem Relation Age of Onset   Cancer Mother        breast   Healthy Son    Healthy Daughter    Healthy Daughter    Social History   Socioeconomic History   Marital status: Married    Spouse name: Not  on file   Number of children: Not on file   Years of education: Not on file   Highest education level: Not on file  Occupational History   Not on file  Tobacco Use   Smoking status: Never   Smokeless tobacco: Never  Vaping Use   Vaping Use: Never used  Substance and Sexual Activity   Alcohol use: No   Drug use: No   Sexual activity: Not Currently  Other Topics Concern   Not on file  Social History Narrative   Not on file   Social Determinants of Health   Financial Resource Strain: Not on file  Food Insecurity: Not on file  Transportation Needs: Not on file  Physical Activity: Not on file  Stress: Not on file  Social Connections: Not on file   Past Surgical History:  Procedure Laterality Date   EYE SURGERY     bilateral cataracts   KNEE ARTHROPLASTY     TOTAL KNEE ARTHROPLASTY Right 02/28/2013   TOTAL KNEE ARTHROPLASTY Right 02/27/2013   Procedure: TOTAL KNEE ARTHROPLASTY- knee;  Surgeon: Newt Minion, MD;  Location: [facility];  Service: Orthopedics;  Laterality: Right;  Right Total Knee Arthroplasty   TOTAL KNEE ARTHROPLASTY Left 07/04/2014   Procedure: LEFT TOTAL KNEE ARTHROPLASTY;  Surgeon: Newt Minion, MD;  Location: [facility];  Service: Orthopedics;  Laterality: Left;   Past Surgical History:  Procedure Laterality Date   EYE SURGERY     bilateral cataracts  KNEE ARTHROPLASTY     TOTAL KNEE ARTHROPLASTY Right 02/28/2013   TOTAL KNEE ARTHROPLASTY Right 02/27/2013   Procedure: TOTAL KNEE ARTHROPLASTY- knee;  Surgeon: Newt Minion, MD;  Location: [facility];  Service: Orthopedics;  Laterality: Right;  Right Total Knee Arthroplasty   TOTAL KNEE ARTHROPLASTY Left 07/04/2014   Procedure: LEFT TOTAL KNEE ARTHROPLASTY;  Surgeon: Newt Minion, MD;  Location: [facility];  Service: Orthopedics;  Laterality: Left;   Past Medical History:  Diagnosis Date   Anxiety    takes no meds   DVT (deep venous thrombosis) (HCC)    GERD (gastroesophageal reflux disease)    High cholesterol     Hypertension    Rheumatoid arthritis (HCC)    Type II diabetes mellitus (HCC)    BP 127/69   Pulse 82   Ht 5\' 3"  (1.6 m)   Wt 195 lb 3.2 oz (88.5 kg)   LMP  (LMP Unknown)   SpO2 92%   BMI 34.58 kg/m   Opioid Risk Score:   Fall Risk Score:  `1  Depression screen [facility] 2/9     06/02/2021    1:30 PM 02/05/2021   11:03 AM 07/27/2020   10:41 AM 05/25/2020    2:38 PM  Depression screen [facility] 2/9  Decreased Interest 0 0 0 0  Down, Depressed, Hopeless 0 0 0 0  PHQ - 2 Score 0 0 0 0  Altered sleeping    0  Tired, decreased energy    0  Change in appetite    0  Feeling bad or failure about yourself     0  Trouble concentrating    0  Moving slowly or fidgety/restless    0  Suicidal thoughts    0  PHQ-9 Score    0     Review of Systems  Musculoskeletal:        Bilateral back of leg pain  All other systems reviewed and are negative.     Objective:   Physical Exam  Awake, alert, accompanied by other daughter- [name], using RW to get around, lumbar flexion at rest, NAD TTP across low back in band across low back Weak core strength      Assessment & Plan:   Pt is a 66 yr old female with RA  on Humira, chronic low back pain, HTN, DM- B/L knee replacement here for f/u on chronic pain. In 2017, had moderately severe B/L foraminal narrowing at L5/S1 and L L4/5 formainal narrowing- moderately severe. Has R foot deformity and supposedly cannot get it fixed by Ortho.    On MTX injectable and Humira.here for f/u on chronic pain/low back pain.  ESI done by Dr Letta Pate 07/29/21  Increase Norco to 7.5/325 mg up to 4x/day on next Rx since increasing her home dose currently, wil run out early- as early as 10/16/21- sent in new Rx.   2. Until uses up the 5/325 mg, take 1.5 tabs 4x/day instead of 1 tab 4x/day.    3. Can get another ESI/epidural steroid injection as early as 11/03/21. Will ask for it to be scheduled. If it only lasts 2 weeks again, then it's not worth doing again.    4.  Con't Duloxetine 60 mg daily and FlexerilCyclobenzaprine 10 mg nightly for muscle spasms- got refills late July, so not due.   5. F/U in 3 months-   6. UDS today  I spent a total of  21  minutes on total care today- >50% coordination of  care- due to discussion of options for chronic pain

## 2021-10-06 NOTE — Patient Instructions (Signed)
Pt is a 66 yr old female with RA  on Humira, chronic low back pain, HTN, DM- B/L knee replacement here for f/u on chronic pain. In 2017, had moderately severe B/L foraminal narrowing at L5/S1 and L L4/5 formainal narrowing- moderately severe. Has R foot deformity and supposedly cannot get it fixed by Ortho.    On MTX injectable and Humira.here for f/u on chronic pain/low back pain.  ESI done by Dr Letta Pate 07/29/21  Increase Norco to 7.5/325 mg up to 4x/day on next Rx since increasing her home dose currently, wil run out early- as early as 10/16/21- sent in new Rx.   2. Until uses up the 5/325 mg, take 1.5 tabs 4x/day instead of 1 tab 4x/day.    3. Can get another ESI/epidural steroid injection as early as 11/03/21. Will ask for it to be scheduled. If it only lasts 2 weeks again, then it's not worth doing again.    4. Con't Duloxetine 60 mg daily and FlexerilCyclobenzaprine 10 mg nightly for muscle spasms- got refills late July, so not due.   5. F/U in 77months-

## 2021-10-08 LAB — TOXASSURE SELECT,+ANTIDEPR,UR

## 2021-10-12 ENCOUNTER — Other Ambulatory Visit: Payer: Self-pay | Admitting: Physician Assistant

## 2021-10-12 ENCOUNTER — Other Ambulatory Visit: Payer: Self-pay | Admitting: Rheumatology

## 2021-10-12 ENCOUNTER — Other Ambulatory Visit (HOSPITAL_COMMUNITY): Payer: Self-pay

## 2021-10-12 DIAGNOSIS — M0609 Rheumatoid arthritis without rheumatoid factor, multiple sites: Secondary | ICD-10-CM

## 2021-10-12 DIAGNOSIS — Z79899 Other long term (current) drug therapy: Secondary | ICD-10-CM

## 2021-10-12 MED ORDER — HUMIRA (2 PEN) 40 MG/0.4ML ~~LOC~~ AJKT
40.0000 mg | AUTO-INJECTOR | SUBCUTANEOUS | 0 refills | Status: DC
  Filled 2021-10-13: qty 2, 28d supply, fill #0

## 2021-10-12 MED ORDER — OTREXUP 20 MG/0.4ML ~~LOC~~ SOAJ
20.0000 mg | SUBCUTANEOUS | 0 refills | Status: DC
  Filled 2021-10-13: qty 1.6, 28d supply, fill #0

## 2021-10-12 NOTE — Telephone Encounter (Signed)
Next Visit: 11/23/2021  Last Visit: 06/22/2021  Last Fill: 08/04/2021  DX: Rheumatoid arthritis of multiple sites with negative rheumatoid factor   Current Dose per office note 06/22/2021: Humira 40 mg subcutaneous injections every 14 days  Labs: 06/22/2021 Glucose is 140.  Rest of CMP WNL. RBC count, hgb, and hct are borderline low but stable.  Absolute eosinophils are slightly elevated but stable. Rest of CBC WNL.   TB Gold: 03/29/2021 Neg  Left message with daughter Verdene Lennert that her mother is due for lab work.   Okay to refill Humira?

## 2021-10-12 NOTE — Telephone Encounter (Signed)
Next Visit: 11/23/2021   Last Visit: 06/22/2021   Last Fill: 09/15/2021   DX: Rheumatoid arthritis of multiple sites with negative rheumatoid factor    Current Dose per office note 06/22/2021: Rasuvo 20 mg subcutaneous injections once weekly  Labs: 06/22/2021 Glucose is 140.  Rest of CMP WNL. RBC count, hgb, and hct are borderline low but stable.  Absolute eosinophils are slightly elevated but stable. Rest of CBC WNL.    Left message with daughter Verdene Lennert that her mother is due for lab work.   Okay to fill methotrexate?

## 2021-10-13 ENCOUNTER — Telehealth: Payer: Self-pay | Admitting: *Deleted

## 2021-10-13 ENCOUNTER — Other Ambulatory Visit (HOSPITAL_COMMUNITY): Payer: Self-pay

## 2021-10-13 NOTE — Telephone Encounter (Signed)
Urine drug screen for this encounter is consistent for prescribed medication 

## 2021-10-20 ENCOUNTER — Other Ambulatory Visit (HOSPITAL_COMMUNITY): Payer: Self-pay

## 2021-10-29 NOTE — Progress Notes (Unsigned)
Office Visit Note  Patient: Connie Ross             Date of Birth: 09/06/1955           MRN: [id number]             PCP: Iona Beard, MD Referring: Iona Beard, MD Visit Date: 11/10/2021 Occupation: _0 @  Subjective:  Intermittent arthralgias   History of Present Illness: Connie Ross is a 66 y.o. female with history of seronegative rheumatoid arthritis and osteoarthritis.  She remains on Rasuvo 20 mg subcutaneous injections once weekly, folic acid 2 mg by mouth daily, Humira 40 mg subcutaneous injections every 14 days.  She has been tolerating combination therapy without any side effects.  She has not had any recent or recurrent infections.  She denies any signs or symptoms of a rheumatoid arthritis flare.  She continues to have chronic pain in her lower back which causes some lower extremity weakness on the right side.  She has been using a walker to assist with ambulation.  She experiences intermittent aching and stiffness in both hands especially with colder weather temperatures but denies any joint swelling.  She has some discomfort in her shoulder joints especially while changing clothes.  She has been under the care of Dr. Dagoberto Ligas for pain management.  She is taking Cymbalta as prescribed.    Activities of Daily Living:  Patient reports morning stiffness for 1 hour.   Patient Reports nocturnal pain.  Difficulty dressing/grooming: Reports Difficulty climbing stairs: Reports Difficulty getting out of chair: Reports Difficulty using hands for taps, buttons, cutlery, and/or writing: Reports  Review of Systems  Constitutional:  Positive for fatigue.  HENT:  Positive for mouth dryness. Negative for mouth sores and nose dryness.   Eyes:  Negative for pain, visual disturbance and dryness.  Respiratory:  Negative for cough, hemoptysis, shortness of breath and difficulty breathing.   Cardiovascular:  Negative for chest pain, palpitations, hypertension and  swelling in legs/feet.  Gastrointestinal:  Negative for blood in stool, constipation and diarrhea.  Endocrine: Negative for increased urination.  Genitourinary:  Negative for painful urination and involuntary urination.  Musculoskeletal:  Positive for joint pain, gait problem, joint pain, muscle weakness and morning stiffness. Negative for joint swelling, myalgias, muscle tenderness and myalgias.  Skin:  Negative for color change, pallor, rash, hair loss, nodules/bumps, skin tightness, ulcers and sensitivity to sunlight.  Allergic/Immunologic: Negative for susceptible to infections.  Neurological:  Negative for dizziness, numbness, headaches and weakness.  Hematological:  Negative for swollen glands.  Psychiatric/Behavioral:  Positive for depressed mood. Negative for sleep disturbance. The patient is nervous/anxious.     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 05/25/2020   Chronic bilateral low back pain with bilateral sciatica 05/25/2020   Pain in right foot 04/06/2016   Rheumatoid arthritis of multiple sites with negative rheumatoid factor ([place]) 02/03/2016   High risk medication use 02/03/2016   Osteoarthritis of lumbar spine 02/03/2016   Essential hypertension 02/03/2016   History of diabetes mellitus 02/03/2016   History of diabetic neuropathy 02/03/2016   Dyslipidemia 02/03/2016   Vitamin D deficiency 02/03/2016   Abnormality of gait 03/15/2013   Right knee pain 03/15/2013   History of total knee replacement, bilateral 02/27/2013    Past Medical History:  Diagnosis Date   Anxiety  takes no meds   DVT (deep venous thrombosis) (HCC)    GERD (gastroesophageal reflux disease)    High cholesterol    Hypertension    Rheumatoid arthritis ([place])    Type II diabetes mellitus ([place])     Family  History  Adopted: Yes  Problem Relation Age of Onset   Cancer Mother        breast   Healthy Son    Healthy Daughter    Healthy Daughter    Past Surgical History:  Procedure Laterality Date   EYE SURGERY     bilateral cataracts   KNEE ARTHROPLASTY     TOTAL KNEE ARTHROPLASTY Right 02/28/2013   TOTAL KNEE ARTHROPLASTY Right 02/27/2013   Procedure: TOTAL KNEE ARTHROPLASTY- knee;  Surgeon: Newt Minion, MD;  Location: [facility];  Service: Orthopedics;  Laterality: Right;  Right Total Knee Arthroplasty   TOTAL KNEE ARTHROPLASTY Left 07/04/2014   Procedure: LEFT TOTAL KNEE ARTHROPLASTY;  Surgeon: Newt Minion, MD;  Location: [facility];  Service: Orthopedics;  Laterality: Left;   Social History   Social History Narrative   Not on file   Immunization History  Administered Date(s) Administered   Influenza-Unspecified 09/03/2012   Moderna Sars-Covid-2 Vaccination 07/25/2019, 08/22/2019   Pneumococcal Polysaccharide-23 03/01/2013     Objective: Vital Signs: BP (!) 147/80 (BP Location: Left Arm, Patient Position: Sitting, Cuff Size: Normal)   Pulse 78   Resp 15   Ht _0  (1.549 m)   Wt 197 lb 12.8 oz (89.7 kg)   LMP  (LMP Unknown)   BMI 37.37 kg/m    Physical Exam Vitals and nursing note reviewed.  Constitutional:      Appearance: She is well-developed.  HENT:     Head: Normocephalic and atraumatic.  Eyes:     Conjunctiva/sclera: Conjunctivae normal.  Cardiovascular:     Rate and Rhythm: Normal rate and regular rhythm.     Heart sounds: Normal heart sounds.  Pulmonary:     Effort: Pulmonary effort is normal.     Breath sounds: Normal breath sounds.  Abdominal:     General: Bowel sounds are normal.     Palpations: Abdomen is soft.  Musculoskeletal:     Cervical back: Normal range of motion.  Skin:    General: Skin is warm and dry.     Capillary Refill: Capillary refill takes less than 2 seconds.  Neurological:     Mental Status: She is alert and oriented to person, place,  and time.  Psychiatric:        Behavior: Behavior normal.      Musculoskeletal Exam: Patient remained in the seated position.  C-spine has good range of motion with no discomfort.  Difficult to assess lumbar range of motion in seated position.  Shoulder joints have discomfort and stiffness bilaterally.  Elbow joints have good range of motion with no tenderness or synovitis.  Wrist joints, MCPs, PIPs, DIPs have good range of motion with no synovitis.  PIP and DIP thickening consistent with osteoarthritis of both hands.  Hip joints have good assess in seated position.  Bilateral knee replacements have good range of motion.  Ankle joints have good range of motion with no tenderness or synovitis.  CDAI Exam: CDAI Score: -- Patient Global: --; Provider Global: -- Swollen: --; Tender: -- Joint Exam 11/10/2021   No joint exam has been documented for this visit   There is currently no information documented on the homunculus. Go to the Rheumatology activity and  complete the homunculus joint exam.  Investigation: No additional findings.  Imaging: No results found.  Recent Labs: Lab Results  Component Value Date   WBC 7.1 06/22/2021   HGB 11.4 (L) 06/22/2021   PLT 251 06/22/2021   NA 141 06/22/2021   K 4.1 06/22/2021   CL 106 06/22/2021   CO2 27 06/22/2021   GLUCOSE 140 (H) 06/22/2021   BUN 18 06/22/2021   CREATININE 0.67 06/22/2021   BILITOT 0.3 06/22/2021   ALKPHOS 102 06/20/2016   AST 18 06/22/2021   ALT 15 06/22/2021   PROT 7.1 06/22/2021   ALBUMIN 3.7 06/20/2016   CALCIUM 9.2 06/22/2021   GFRAA 108 06/29/2020   QFTBGOLDPLUS NEGATIVE 03/29/2021    Speciality Comments: No specialty comments available.  Procedures:  No procedures performed Allergies: Patient has no known allergies.   Assessment / Plan:     Visit Diagnoses: Rheumatoid arthritis of multiple sites with negative rheumatoid factor (HCC) - RF negative, CCP negative, elevated ESR, positive ANA, positive Ro:  She has no synovitis on examination today.  She has not had any signs or symptoms of a rheumatoid arthritis flare.  Overall she has clinically been doing well on Rasuvo 20 mg subcutaneous injections once weekly and Humira 40 mg subcu as injections every 14 days.  She is tolerating combination therapy without any side effects.  She has not had any recent or recurrent infections.  She continues to have chronic pain in her lower back.  She has been under the care of Dr. Dagoberto Ligas for pain management and is taking hydrocodone every 6 hours as needed for pain relief along with Cymbalta and Flexeril as prescribed. She will remain on Rasuvo and Humira as prescribed.  CBC and CMP will be updated today.  Her next lab work will be due in February and every 3 months to monitor for drug toxicity.  Refills of Humira and Rasuvo will be sent to the pharmacy pending lab results today.  She was advised to notify us if she develops signs or symptoms of a flare.  She will follow-up in the office in 5 months or sooner if needed.  High risk medication use - Rasuvo 20 mg subcutaneous injections once weekly, folic acid 2 mg by mouth daily, Humira 40 mg subcutaneous injections every 14 days. - Plan: COMPLETE METABOLIC PANEL WITH GFR, CBC with Differential/Platelet CBC and CMP updated on 06/22/21. Orders for CBC and CMP released today. Her next lab work will be due in February and every 3 months.  TB gold negative on 03/29/21.  She has not had any recent or recurrent infections.  Discussed the importance of holding rasuvo and humira if she develops signs or symptoms of an infection and to resume once the infection has completely cleared.  She has not yet had the annual flu shot but is planning to receive it.   History of total knee replacement, bilateral: She has good range of motion of both knee replacements on examination today.  No warmth or effusion noted.  She is using a walker to assist with ambulation.   DDD (degenerative disc  disease), lumbar: Chronic pain.  The discomfort in her lower back has limited her mobility.  She has been using a walker to assist with ambulation.  She is under the care of Dr. Dagoberto Ligas for pain management.  She has been taking hydrocodone every 6 hours as needed for pain relief.  She is also taking Cymbalta as prescribed and Flexeril as needed for muscle spasms.  She may benefit from physical therapy to improve her lower back discomfort as well as for lower extremity muscle strengthening and fall prevention.  According to her granddaughter her pain level and stiffness have been limiting how much she is willing to leave the house.  She will notify us if she would like [place] to place a referral for physical therapy.  Other medical conditions are listed as follows:  History of hypertension: Blood pressure is 147/80 today in the office.  Blood pressure was rechecked today.  Patient was encouraged to monitor blood pressure closely.  Dyslipidemia  History of vitamin D deficiency  History of diabetic neuropathy  History of diabetes mellitus  Language barrier  Orders: Orders Placed This Encounter  Procedures   COMPLETE METABOLIC PANEL WITH GFR   CBC with Differential/Platelet   No orders of the defined types were placed in this encounter.    Follow-Up Instructions: Return in about 5 months (around 04/11/2022) for Rheumatoid arthritis.   Ofilia Neas, PA-C  Note - This record has been created using Dragon software.  Chart creation errors have been sought, but may not always  have been located. Such creation errors do not reflect on  the standard of medical care.

## 2021-11-09 ENCOUNTER — Other Ambulatory Visit (HOSPITAL_COMMUNITY): Payer: Self-pay

## 2021-11-09 ENCOUNTER — Other Ambulatory Visit: Payer: Self-pay | Admitting: Physician Assistant

## 2021-11-09 DIAGNOSIS — M0609 Rheumatoid arthritis without rheumatoid factor, multiple sites: Secondary | ICD-10-CM

## 2021-11-09 DIAGNOSIS — Z79899 Other long term (current) drug therapy: Secondary | ICD-10-CM

## 2021-11-10 ENCOUNTER — Other Ambulatory Visit: Payer: Self-pay

## 2021-11-10 ENCOUNTER — Ambulatory Visit: Payer: BC Managed Care – PPO | Attending: Physician Assistant | Admitting: Physician Assistant

## 2021-11-10 ENCOUNTER — Encounter: Payer: Self-pay | Admitting: Physician Assistant

## 2021-11-10 VITALS — BP 147/80 | HR 78 | Resp 15 | Ht 61.0 in | Wt 197.8 lb

## 2021-11-10 DIAGNOSIS — Z79899 Other long term (current) drug therapy: Secondary | ICD-10-CM

## 2021-11-10 DIAGNOSIS — Z96653 Presence of artificial knee joint, bilateral: Secondary | ICD-10-CM

## 2021-11-10 DIAGNOSIS — M0609 Rheumatoid arthritis without rheumatoid factor, multiple sites: Secondary | ICD-10-CM

## 2021-11-10 DIAGNOSIS — Z789 Other specified health status: Secondary | ICD-10-CM | POA: Diagnosis not present

## 2021-11-10 DIAGNOSIS — Z8679 Personal history of other diseases of the circulatory system: Secondary | ICD-10-CM | POA: Diagnosis not present

## 2021-11-10 DIAGNOSIS — M5136 Other intervertebral disc degeneration, lumbar region: Secondary | ICD-10-CM | POA: Diagnosis not present

## 2021-11-10 DIAGNOSIS — Z8639 Personal history of other endocrine, nutritional and metabolic disease: Secondary | ICD-10-CM | POA: Diagnosis not present

## 2021-11-10 DIAGNOSIS — E785 Hyperlipidemia, unspecified: Secondary | ICD-10-CM | POA: Diagnosis not present

## 2021-11-10 LAB — CBC WITH DIFFERENTIAL/PLATELET
Absolute Monocytes: 420 cells/uL (ref 200–950)
Basophils Absolute: 53 cells/uL (ref 0–200)
Basophils Relative: 0.7 %
Eosinophils Absolute: 1590 cells/uL — ABNORMAL HIGH (ref 15–500)
Eosinophils Relative: 21.2 %
HCT: 36.3 % (ref 35.0–45.0)
Hemoglobin: 12.3 g/dL (ref 11.7–15.5)
Lymphs Abs: 1560 cells/uL (ref 850–3900)
MCH: 32 pg (ref 27.0–33.0)
MCHC: 33.9 g/dL (ref 32.0–36.0)
MCV: 94.5 fL (ref 80.0–100.0)
MPV: 10 fL (ref 7.5–12.5)
Monocytes Relative: 5.6 %
Neutro Abs: 3878 cells/uL (ref 1500–7800)
Neutrophils Relative %: 51.7 %
Platelets: 270 10*3/uL (ref 140–400)
RBC: 3.84 10*6/uL (ref 3.80–5.10)
RDW: 13.9 % (ref 11.0–15.0)
Total Lymphocyte: 20.8 %
WBC: 7.5 10*3/uL (ref 3.8–10.8)

## 2021-11-10 LAB — COMPLETE METABOLIC PANEL WITH GFR
AG Ratio: 1.2 (calc) (ref 1.0–2.5)
ALT: 20 U/L (ref 6–29)
AST: 20 U/L (ref 10–35)
Albumin: 4.1 g/dL (ref 3.6–5.1)
Alkaline phosphatase (APISO): 92 U/L (ref 37–153)
BUN: 16 mg/dL (ref 7–25)
CO2: 30 mmol/L (ref 20–32)
Calcium: 9.6 mg/dL (ref 8.6–10.4)
Chloride: 103 mmol/L (ref 98–110)
Creat: 0.52 mg/dL (ref 0.50–1.05)
Globulin: 3.3 g/dL (calc) (ref 1.9–3.7)
Glucose, Bld: 153 mg/dL — ABNORMAL HIGH (ref 65–99)
Potassium: 4.5 mmol/L (ref 3.5–5.3)
Sodium: 139 mmol/L (ref 135–146)
Total Bilirubin: 0.4 mg/dL (ref 0.2–1.2)
Total Protein: 7.4 g/dL (ref 6.1–8.1)
eGFR: 102 mL/min/{1.73_m2} (ref >=60)

## 2021-11-10 NOTE — Patient Instructions (Signed)
Standing Labs We placed an order today for your standing lab work.   Please have your standing labs drawn in February and every 3 months   Please have your labs drawn 2 weeks prior to your appointment so that the provider can discuss your lab results at your appointment.  Please note that you may see your imaging and lab results in MyChart before we have reviewed them. We will contact you once all results are reviewed. Please allow our office up to 72 hours to thoroughly review all of the results before contacting the office for clarification of your results.  Lab hours are:   Monday through Thursday from 8:00 am -12:30 pm and 1:00 pm-5:00 pm and Friday from 8:00 am-12:00 pm.  Please be advised, all patients with office appointments requiring lab work will take precedent over walk-in lab work.   Labs are drawn by [facility]. Please bring your co-pay at the time of your lab draw.  You may receive a bill from [facility] for your lab work.  Please note if you are on Hydroxychloroquine and and an order has been placed for a Hydroxychloroquine level, you will need to have it drawn 4 hours or more after your last dose.  If you wish to have your labs drawn at another location, please call the office 24 hours in advance so we can fax the orders.  The office is located at [address], [address], [place], [place] [address] No appointment is necessary.    If you have any questions regarding directions or hours of operation,  please call [phone].   As a reminder, please drink plenty of water prior to coming for your lab work. Thanks!  

## 2021-11-10 NOTE — Telephone Encounter (Signed)
Pending lab results, please refill humira and rasuvo per Sherron Ales, PA-C. Thanks!

## 2021-11-11 ENCOUNTER — Other Ambulatory Visit (HOSPITAL_COMMUNITY): Payer: Self-pay

## 2021-11-11 MED ORDER — HUMIRA (2 PEN) 40 MG/0.4ML ~~LOC~~ AJKT
40.0000 mg | AUTO-INJECTOR | SUBCUTANEOUS | 2 refills | Status: DC
  Filled 2021-11-11: qty 2, 28d supply, fill #0
  Filled 2021-12-07 – 2021-12-13 (×3): qty 2, 28d supply, fill #1
  Filled 2022-01-18: qty 2, 28d supply, fill #2

## 2021-11-11 MED ORDER — OTREXUP 20 MG/0.4ML ~~LOC~~ SOAJ
20.0000 mg | SUBCUTANEOUS | 2 refills | Status: DC
  Filled 2021-11-11: qty 1.6, 28d supply, fill #0
  Filled 2021-12-07 – 2021-12-13 (×3): qty 1.6, 28d supply, fill #1
  Filled 2022-01-18: qty 1.6, 28d supply, fill #2

## 2021-11-11 NOTE — Telephone Encounter (Signed)
Glucose 153, Eosinophils Absolute 1,590

## 2021-11-11 NOTE — Progress Notes (Signed)
Glucose is 153. Rest of CMP WNL.  Absolute eosinophils are elevated and continue to trend up. Rest of CBC WNL.  We will continue to monitor closely.

## 2021-11-16 ENCOUNTER — Telehealth: Payer: Self-pay | Admitting: *Deleted

## 2021-11-16 NOTE — Telephone Encounter (Signed)
Requesting refill on hydrocodone.   °

## 2021-11-17 ENCOUNTER — Encounter: Payer: Self-pay | Admitting: *Deleted

## 2021-11-17 ENCOUNTER — Other Ambulatory Visit (HOSPITAL_COMMUNITY): Payer: Self-pay

## 2021-11-17 MED ORDER — HYDROCODONE-ACETAMINOPHEN 7.5-325 MG PO TABS: 1.0000 | ORAL_TABLET | Freq: Four times a day (QID) | ORAL | 0 refills | Status: DC | PRN

## 2021-11-19 ENCOUNTER — Encounter: Payer: Self-pay | Admitting: Physical Medicine & Rehabilitation

## 2021-11-19 ENCOUNTER — Encounter
Payer: BC Managed Care – PPO | Attending: Physical Medicine and Rehabilitation | Admitting: Physical Medicine & Rehabilitation

## 2021-11-19 VITALS — BP 135/85 | HR 80 | Ht 61.0 in | Wt 195.0 lb

## 2021-11-19 DIAGNOSIS — M5116 Intervertebral disc disorders with radiculopathy, lumbar region: Secondary | ICD-10-CM | POA: Insufficient documentation

## 2021-11-19 MED ORDER — DEXAMETHASONE SODIUM PHOSPHATE 10 MG/ML IJ SOLN
10.0000 mg | Freq: Once | INTRAMUSCULAR | Status: AC
  Administered 2021-11-19: 10 mg

## 2021-11-19 MED ORDER — LIDOCAINE HCL (PF) 1 % IJ SOLN
2.0000 mL | Freq: Once | INTRAMUSCULAR | Status: AC
  Administered 2021-11-19: 2 mL

## 2021-11-19 MED ORDER — LIDOCAINE HCL 1 % IJ SOLN
5.0000 mL | Freq: Once | INTRAMUSCULAR | Status: AC
  Administered 2021-11-19: 5 mL

## 2021-11-19 NOTE — Progress Notes (Signed)
Right S1 transforaminal epidural steroid injection under fluoroscopic guidance with contrast enhancement  Indication: Lumbosacral radiculitis is not relieved by medication management or other conservative care and interfering with self-care and mobility.   Informed consent was obtained after describing risk and benefits of the procedure with the patient, this includes bleeding, bruising, infection, paralysis and medication side effects.  The patient wishes to proceed and has given written consent.  Patient was placed in prone position.  The lumbar area was marked and prepped with Betadine.  It was entered with a 25-gauge 1-1/2 inch needle and one mL of 1% lidocaine was injected into the skin and subcutaneous tissue.  Then a 22-gauge 5" spinal needle was inserted into the Right S1 foramen  intervertebral foramen under AP, lateral, and oblique view.  Once needle tip was within the foramen on lateral views an dnor exceeding 6 o clock position on th epedical on AP viewed Isovue 200 was inected x 99ml Then a solution containing one mL of 10 mg per mL dexamethasone and 2 mL of 1% lidocaine was injected.  The patient tolerated procedure well.  Post procedure instructions were given.  Please see post procedure form.

## 2021-11-19 NOTE — Patient Instructions (Signed)

## 2021-11-19 NOTE — Progress Notes (Signed)
  PROCEDURE RECORD [facility]   Name: Synda Bagent DOB:[birth year] MRN: [id number]  Date:11/19/2021  Physician: Claudette Laws, MD    Nurse/CMA: Nedra Hai, CMA  Allergies: No Known Allergies  Consent Signed: Yes.    Is patient diabetic? Yes.    CBG today? 160  Pregnant: No. LMP: No LMP recorded (lmp unknown). Patient is postmenopausal. (age 66-55)  Anticoagulants: no Anti-inflammatory: no Antibiotics: no  Procedure: Right S1 Transforaminal Epidural Steroid Injection Position: Prone Start Time: 1:14 pm  End Time: 1:17 pm  Fluoro Time: 17  RN/CMA Nedra Hai, CMA Tamsen Reist, CMA    Time 12:55 pm 1:29pm    BP 135/85 157/78    Pulse 80 78    Respirations 16 16    O2 Sat 93 92    S/S 6 6    Pain Level 6/10 0/10     D/C home with daughter [name], patient A & O X 3, D/C instructions reviewed, and sits independently.

## 2021-11-23 ENCOUNTER — Ambulatory Visit: Payer: BC Managed Care – PPO | Admitting: Physician Assistant

## 2021-12-07 ENCOUNTER — Other Ambulatory Visit (HOSPITAL_COMMUNITY): Payer: Self-pay

## 2021-12-08 ENCOUNTER — Other Ambulatory Visit (HOSPITAL_COMMUNITY): Payer: Self-pay

## 2021-12-13 ENCOUNTER — Other Ambulatory Visit (HOSPITAL_COMMUNITY): Payer: Self-pay

## 2021-12-14 ENCOUNTER — Other Ambulatory Visit: Payer: Self-pay

## 2021-12-14 ENCOUNTER — Other Ambulatory Visit (HOSPITAL_COMMUNITY): Payer: Self-pay

## 2021-12-29 ENCOUNTER — Other Ambulatory Visit: Payer: Self-pay

## 2021-12-29 ENCOUNTER — Other Ambulatory Visit (HOSPITAL_COMMUNITY): Payer: Self-pay

## 2021-12-30 ENCOUNTER — Other Ambulatory Visit (HOSPITAL_COMMUNITY): Payer: Self-pay

## 2021-12-30 ENCOUNTER — Other Ambulatory Visit: Payer: Self-pay

## 2022-01-02 ENCOUNTER — Other Ambulatory Visit: Payer: Self-pay | Admitting: Physical Medicine and Rehabilitation

## 2022-01-05 ENCOUNTER — Other Ambulatory Visit (HOSPITAL_COMMUNITY): Payer: Self-pay

## 2022-01-10 ENCOUNTER — Encounter
Payer: BC Managed Care – PPO | Attending: Physical Medicine and Rehabilitation | Admitting: Physical Medicine and Rehabilitation

## 2022-01-10 ENCOUNTER — Encounter: Payer: Self-pay | Admitting: Physical Medicine and Rehabilitation

## 2022-01-10 VITALS — BP 165/91 | HR 90 | Ht 61.0 in

## 2022-01-10 DIAGNOSIS — G894 Chronic pain syndrome: Secondary | ICD-10-CM

## 2022-01-10 DIAGNOSIS — M0609 Rheumatoid arthritis without rheumatoid factor, multiple sites: Secondary | ICD-10-CM

## 2022-01-10 DIAGNOSIS — M5116 Intervertebral disc disorders with radiculopathy, lumbar region: Secondary | ICD-10-CM

## 2022-01-10 DIAGNOSIS — R269 Unspecified abnormalities of gait and mobility: Secondary | ICD-10-CM

## 2022-01-10 MED ORDER — CITALOPRAM HYDROBROMIDE 20 MG PO TABS: 20.0000 mg | ORAL_TABLET | Freq: Every day | ORAL | 5 refills | Status: DC

## 2022-01-10 MED ORDER — HYDROCODONE-ACETAMINOPHEN 7.5-325 MG PO TABS: 1.0000 | ORAL_TABLET | Freq: Four times a day (QID) | ORAL | 0 refills | Status: DC | PRN

## 2022-01-10 NOTE — Patient Instructions (Signed)
Pt is a 67 yr old female with RA  on Humira, chronic low back pain, HTN, DM- B/L knee replacement here for f/u on chronic pain. In 2017, had moderately severe B/L foraminal narrowing at L5/S1 and L L4/5 formainal narrowing- moderately severe. Has R foot deformity and supposedly cannot get it fixed by Ortho.  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 Celexa/Citalopram - if develops symptoms, stop Citalopram immediately.   2. Try Celexa/Citalopram 20 mg daily- wil not increase dose/con't Duloxetine- but neither will increase dose due to #1 risk.  -pt denies depression, but I think daughter is right- severe psychomotor reduction in movement.   3. Will refill Norco/Hydrocodone 7.5/325 mg up to 4x/day # 120- can fil as of 01/13/22- since got filled 12/14- and had 31 days in December.    [place] for outpt Physical therapy since losing strength- and walking os much less- so not using, so losing it.  Sent in Rx for therapy- if you don't hear in the next week, call us and we can help you get scheduled for [facility].   5.  F/U in 3 months- will do UDS at that time- and f/u on strength and depression.

## 2022-01-10 NOTE — Progress Notes (Signed)
Subjective:    Patient ID: Connie Ross, female    DOB: [birth year], 67 y.o.   MRN: [id number]  HPI  Pt is a 66 yr old female with RA  on Humira, chronic low back pain, HTN, DM- B/L knee replacement here for f/u on chronic pain. In 2017, had moderately severe B/L foraminal narrowing at L5/S1 and L L4/5 formainal narrowing- moderately severe. Has R foot deformity and supposedly cannot get it fixed by Ortho   Pt got R S1 ESI by Dr Wynn Banker 11/19/21.   Was helpful- felt better for "awhile".   Has been much more depressed lately-  Pt doesn't want to admit it, but daughter really thinks more depressed-  Doesn't want to try doing things- won't cook, walk around; won't go outside to porch, etc.  Sleeping- a little more- 6 hours/night-    Not due for refills until Sunday, but will run out of pain meds ~ Wednesday.  Was in a lot of pain- trying to do more- get up more and walk more- caused herself to hurt more.  Before Xmas.   Losing strength in hands and feet-  Ordered something from [place]- a hand/foot bike- from seated position.  Getting tomorrow.   Walking much less- walks form kitchen to bathroom- that's it- ~ 60-70 ft at max- uses RW to do so.   Pain Inventory Average Pain 10 Pain Right Now 7 My pain is aching  In the last 24 hours, has pain interfered with the following? General activity 10 Relation with others 10 Enjoyment of life 10 What TIME of day is your pain at its worst? night Sleep (in general) Poor  Pain is worse with: unsure and some activites Pain improves with: medication Relief from Meds: 7  Family History  Adopted: Yes  Problem Relation Age of Onset   Cancer Mother        breast   Healthy Son    Healthy Daughter    Healthy Daughter    Social History   Socioeconomic History   Marital status: Married    Spouse name: Not on file   Number of children: Not on file   Years of education: Not on file   Highest education level: Not on file   Occupational History   Not on file  Tobacco Use   Smoking status: Never    Passive exposure: Never   Smokeless tobacco: Never  Vaping Use   Vaping Use: Never used  Substance and Sexual Activity   Alcohol use: No   Drug use: No   Sexual activity: Not Currently  Other Topics Concern   Not on file  Social History Narrative   Not on file   Social Determinants of Health   Financial Resource Strain: Not on file  Food Insecurity: Not on file  Transportation Needs: Not on file  Physical Activity: Not on file  Stress: Not on file  Social Connections: Not on file   Past Surgical History:  Procedure Laterality Date   EYE SURGERY     bilateral cataracts   KNEE ARTHROPLASTY     TOTAL KNEE ARTHROPLASTY Right 02/28/2013   TOTAL KNEE ARTHROPLASTY Right 02/27/2013   Procedure: TOTAL KNEE ARTHROPLASTY- knee;  Surgeon: Nadara Mustard, MD;  [facility];  Service: Orthopedics;  Laterality: Right;  Right Total Knee Arthroplasty   TOTAL KNEE ARTHROPLASTY Left 07/04/2014   Procedure: LEFT TOTAL KNEE ARTHROPLASTY;  Surgeon: Nadara Mustard, MD;  Location: [facility];  Service: Orthopedics;  Laterality:  Left;   Past Surgical History:  Procedure Laterality Date   EYE SURGERY     bilateral cataracts   KNEE ARTHROPLASTY     TOTAL KNEE ARTHROPLASTY Right 02/28/2013   TOTAL KNEE ARTHROPLASTY Right 02/27/2013   Procedure: TOTAL KNEE ARTHROPLASTY- knee;  Surgeon: Newt Minion, MD;  Location: [place];  Service: Orthopedics;  Laterality: Right;  Right Total Knee Arthroplasty   TOTAL KNEE ARTHROPLASTY Left 07/04/2014   Procedure: LEFT TOTAL KNEE ARTHROPLASTY;  Surgeon: Newt Minion, MD;  Location: [place];  Service: Orthopedics;  Laterality: Left;   Past Medical History:  Diagnosis Date   Anxiety    takes no meds   DVT (deep venous thrombosis) (HCC)    GERD (gastroesophageal reflux disease)    High cholesterol    Hypertension    Rheumatoid arthritis (HCC)    Type II diabetes mellitus (HCC)    Ht 5\' 1"   (1.549 m)   LMP  (LMP Unknown)   BMI 36.84 kg/m   Opioid Risk Score:   Fall Risk Score:  `1  Depression screen [facility] 2/9     01/10/2022    2:53 PM 06/02/2021    1:30 PM 02/05/2021   11:03 AM 07/27/2020   10:41 AM 05/25/2020    2:38 PM  Depression screen PHQ 2/9  Decreased Interest 0 0 0 0 0  Down, Depressed, Hopeless 0 0 0 0 0  PHQ - 2 Score 0 0 0 0 0  Altered sleeping     0  Tired, decreased energy     0  Change in appetite     0  Feeling bad or failure about yourself      0  Trouble concentrating     0  Moving slowly or fidgety/restless     0  Suicidal thoughts     0  PHQ-9 Score     0      Review of Systems  Musculoskeletal:  Positive for back pain and gait problem.  All other systems reviewed and are negative.     Objective:   Physical Exam  Awake, alert, appropriate, flat; low energy- appears to be falling asleep on me; accompanied by other daughter, NAD TTP in band across low back as well as knees B/L  Ulnar deviation noted in hands B/L MS: Ue's limited by impaired effort in strength- some could be communication/language barrier Also poor motivation Strength 4/5 in Ue's biceps, triceps, grip 3+/5 B/L  and FA is 2+/5 B/L  LE's HF 2/5 B/L ; LE/KF 3/5 B/L - DF/PF 4+/5 B/L -  Reduced psychomotor movement.      Assessment & Plan:   Pt is a 67 yr old female with RA  on Humira, chronic low back pain, HTN, DM- B/L knee replacement here for f/u on chronic pain. In 2017, had moderately severe B/L foraminal narrowing at L5/S1 and L L4/5 formainal narrowing- moderately severe. Has R foot deformity and supposedly cannot get it fixed by Ortho.  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 Celexa/Citalopram - if develops symptoms, stop Citalopram immediately.   2. Try Celexa/Citalopram 20 mg daily- wil not increase dose/con't Duloxetine- but neither will increase dose due to #1 risk.  -pt denies depression, but I think  daughter is right- severe psychomotor reduction in movement.   3. Will refill Norco/Hydrocodone 7.5/325 mg up to 4x/day # 120- can fil as of 01/13/22- since got filled 12/14- and had 31 days  in December.    [place] for outpt Physical therapy since losing strength- and walking os much less- so not using, so losing it.  Sent in Rx for therapy- if you don't hear in the next week, call us and we can help you get scheduled for [facility].   5.  F/U in 3 months- will do UDS at that time- and f/u on strength and depression.     I spent a total of   34 minutes on total care today- >50% coordination of care- due to d/w daughter in [place] about pt's depression, as well as d/w daughter about need for therapy/PT- ordered- also will con't Norco for pain- educated that cannot take more, because that's against clinic/federal policy.

## 2022-01-14 ENCOUNTER — Other Ambulatory Visit (HOSPITAL_COMMUNITY): Payer: Self-pay

## 2022-01-18 ENCOUNTER — Other Ambulatory Visit (HOSPITAL_COMMUNITY): Payer: Self-pay

## 2022-01-18 ENCOUNTER — Other Ambulatory Visit: Payer: Self-pay

## 2022-01-21 ENCOUNTER — Other Ambulatory Visit (HOSPITAL_COMMUNITY): Payer: Self-pay

## 2022-01-24 ENCOUNTER — Other Ambulatory Visit (HOSPITAL_COMMUNITY): Payer: Self-pay

## 2022-02-04 ENCOUNTER — Other Ambulatory Visit: Payer: Self-pay | Admitting: Physical Medicine and Rehabilitation

## 2022-02-07 ENCOUNTER — Other Ambulatory Visit (HOSPITAL_COMMUNITY): Payer: Self-pay

## 2022-02-08 ENCOUNTER — Other Ambulatory Visit: Payer: Self-pay | Admitting: Physical Medicine and Rehabilitation

## 2022-02-08 NOTE — Telephone Encounter (Signed)
Per pharmacy [facility] refills requesting refill for flexeril

## 2022-02-11 ENCOUNTER — Other Ambulatory Visit: Payer: Self-pay

## 2022-02-11 ENCOUNTER — Telehealth: Payer: Self-pay | Admitting: Physical Medicine and Rehabilitation

## 2022-02-11 NOTE — Telephone Encounter (Signed)
Requesting refill hydrocodone, [facility] in [place]

## 2022-02-15 ENCOUNTER — Other Ambulatory Visit (HOSPITAL_COMMUNITY): Payer: Self-pay

## 2022-02-15 ENCOUNTER — Encounter: Payer: Self-pay | Admitting: Physical Medicine and Rehabilitation

## 2022-02-15 ENCOUNTER — Other Ambulatory Visit: Payer: Self-pay

## 2022-02-15 ENCOUNTER — Telehealth: Payer: Self-pay | Admitting: Physical Medicine and Rehabilitation

## 2022-02-15 ENCOUNTER — Other Ambulatory Visit: Payer: Self-pay | Admitting: Physician Assistant

## 2022-02-15 DIAGNOSIS — Z79899 Other long term (current) drug therapy: Secondary | ICD-10-CM

## 2022-02-15 DIAGNOSIS — M0609 Rheumatoid arthritis without rheumatoid factor, multiple sites: Secondary | ICD-10-CM

## 2022-02-15 MED ORDER — OTREXUP 20 MG/0.4ML ~~LOC~~ SOAJ
20.0000 mg | SUBCUTANEOUS | 0 refills | Status: DC
  Filled 2022-02-15: qty 1.6, 28d supply, fill #0

## 2022-02-15 MED ORDER — HUMIRA (2 PEN) 40 MG/0.4ML ~~LOC~~ AJKT
40.0000 mg | AUTO-INJECTOR | SUBCUTANEOUS | 0 refills | Status: DC
  Filled 2022-02-15: qty 2, 28d supply, fill #0

## 2022-02-15 NOTE — Telephone Encounter (Signed)
Next Visit: 04/19/2022  Last Visit: 11/10/2021  Last Fill: 11/11/2021  [id number] arthritis of multiple sites with negative rheumatoid factor   Current Dose per office note 11/10/2021: Rasuvo 20 mg subcutaneous injections once weekly Humira 40 mg subcutaneous injections every 14 days.   Labs: 11/10/2021  TB Gold: 03/29/2021 Neg    Left message to advise patient's daughter Connie Ross know patient is due to update labs.   Okay to refill Rasuvo and Humira?

## 2022-02-15 NOTE — Telephone Encounter (Signed)
The patients daughter called and said she needs a refill on her hydrocodone. [facility] [place] [address].

## 2022-02-16 ENCOUNTER — Other Ambulatory Visit: Payer: Self-pay

## 2022-02-16 ENCOUNTER — Other Ambulatory Visit (HOSPITAL_COMMUNITY): Payer: Self-pay

## 2022-02-16 MED ORDER — HYDROCODONE-ACETAMINOPHEN 7.5-325 MG PO TABS: 1.0000 | ORAL_TABLET | Freq: Four times a day (QID) | ORAL | 0 refills | Status: DC | PRN

## 2022-02-17 ENCOUNTER — Other Ambulatory Visit: Payer: Self-pay

## 2022-02-17 ENCOUNTER — Other Ambulatory Visit (HOSPITAL_COMMUNITY): Payer: Self-pay

## 2022-02-21 NOTE — Telephone Encounter (Signed)
Rx submitted nothing further needed.

## 2022-02-21 NOTE — Telephone Encounter (Signed)
Request submitted note closed.

## 2022-03-14 ENCOUNTER — Other Ambulatory Visit: Payer: Self-pay | Admitting: *Deleted

## 2022-03-14 MED ORDER — HYDROCODONE-ACETAMINOPHEN 7.5-325 MG PO TABS: 1.0000 | ORAL_TABLET | Freq: Four times a day (QID) | ORAL | 0 refills | Status: DC | PRN

## 2022-03-14 NOTE — Telephone Encounter (Signed)
Patient requesting refill on Hydrocodone last refill 02/16/22 [facility] [place]. I don't have access to PMP sorry.

## 2022-03-16 ENCOUNTER — Other Ambulatory Visit: Payer: Self-pay | Admitting: Physician Assistant

## 2022-03-16 ENCOUNTER — Other Ambulatory Visit (HOSPITAL_COMMUNITY): Payer: Self-pay

## 2022-03-16 DIAGNOSIS — M0609 Rheumatoid arthritis without rheumatoid factor, multiple sites: Secondary | ICD-10-CM

## 2022-03-16 DIAGNOSIS — Z79899 Other long term (current) drug therapy: Secondary | ICD-10-CM

## 2022-03-18 ENCOUNTER — Other Ambulatory Visit (HOSPITAL_COMMUNITY): Payer: Self-pay

## 2022-03-22 ENCOUNTER — Other Ambulatory Visit: Payer: Self-pay | Admitting: *Deleted

## 2022-03-22 DIAGNOSIS — Z111 Encounter for screening for respiratory tuberculosis: Secondary | ICD-10-CM

## 2022-03-22 DIAGNOSIS — Z9225 Personal history of immunosupression therapy: Secondary | ICD-10-CM

## 2022-03-22 DIAGNOSIS — Z79899 Other long term (current) drug therapy: Secondary | ICD-10-CM

## 2022-03-24 ENCOUNTER — Other Ambulatory Visit (HOSPITAL_COMMUNITY): Payer: Self-pay

## 2022-03-28 ENCOUNTER — Other Ambulatory Visit (HOSPITAL_COMMUNITY): Payer: Self-pay

## 2022-04-05 NOTE — Progress Notes (Deleted)
Office Visit Note  Patient: Connie Ross             Date of Birth: [birth year]           MRN: [id number]             PCP: Iona Beard, MD Referring: Iona Beard, MD Visit Date: 04/19/2022 Occupation: @GUAROCC @  Subjective:  No chief complaint on file.   History of Present Illness: Connie Ross is a 67 y.o. female ***     Activities of Daily Living:  Patient reports morning stiffness for *** {minute/hour:19697}.   Patient {ACTIONS;DENIES/REPORTS:21021675::"Denies"} nocturnal pain.  Difficulty dressing/grooming: {ACTIONS;DENIES/REPORTS:21021675::"Denies"} Difficulty climbing stairs: {ACTIONS;DENIES/REPORTS:21021675::"Denies"} Difficulty getting out of chair: {ACTIONS;DENIES/REPORTS:21021675::"Denies"} Difficulty using hands for taps, buttons, cutlery, and/or writing: {ACTIONS;DENIES/REPORTS:21021675::"Denies"}  No Rheumatology ROS completed.   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 05/25/2020   Chronic bilateral low back pain with bilateral sciatica 05/25/2020   Pain in right foot 04/06/2016   Rheumatoid arthritis of multiple sites with negative rheumatoid factor 02/03/2016   High risk medication use 02/03/2016   Osteoarthritis of lumbar spine 02/03/2016   Essential hypertension 02/03/2016   History of diabetes mellitus 02/03/2016   History of diabetic neuropathy 02/03/2016   Dyslipidemia 02/03/2016   Vitamin D deficiency 02/03/2016   Abnormality of gait 03/15/2013   Right knee pain 03/15/2013   History of total knee replacement, bilateral 02/27/2013    Past Medical History:  Diagnosis Date   Anxiety    takes no meds   DVT (deep venous thrombosis) (HCC)    GERD (gastroesophageal reflux disease)    High cholesterol    Hypertension    Rheumatoid arthritis  ([place])    Type II diabetes mellitus ([place])     Family History  Adopted: Yes  Problem Relation Age of Onset   Cancer Mother        breast   Healthy Son    Healthy Daughter    Healthy Daughter    Past Surgical History:  Procedure Laterality Date   EYE SURGERY     bilateral cataracts   KNEE ARTHROPLASTY     TOTAL KNEE ARTHROPLASTY Right 02/28/2013   TOTAL KNEE ARTHROPLASTY Right 02/27/2013   Procedure: TOTAL KNEE ARTHROPLASTY- knee;  Surgeon: Newt Minion, MD;  Location: [place];  Service: Orthopedics;  Laterality: Right;  Right Total Knee Arthroplasty   TOTAL KNEE ARTHROPLASTY Left 07/04/2014   Procedure: LEFT TOTAL KNEE ARTHROPLASTY;  Surgeon: Newt Minion, MD;  Location: [facility];  Service: Orthopedics;  Laterality: Left;   Social History   Social History Narrative   Not on file   Immunization History  Administered Date(s) Administered   Influenza-Unspecified 09/03/2012   Moderna Sars-Covid-2 Vaccination 07/25/2019, 08/22/2019   Pneumococcal Polysaccharide-23 03/01/2013     Objective: Vital Signs: LMP  (LMP Unknown)    Physical Exam   Musculoskeletal Exam: ***  CDAI Exam: CDAI Score: -- Patient Global: --; Provider Global: -- Swollen: --; Tender: -- Joint Exam 04/19/2022   No joint exam has been documented for this visit   There is currently no information documented on the homunculus. Go to the Rheumatology activity and complete the homunculus joint exam.  Investigation: No additional findings.  Imaging: No results found.  Recent Labs: Lab Results  Component Value Date   WBC 7.5 11/10/2021  HGB 12.3 11/10/2021   PLT 270 11/10/2021   NA 139 11/10/2021   K 4.5 11/10/2021   CL 103 11/10/2021   CO2 30 11/10/2021   GLUCOSE 153 (H) 11/10/2021   BUN 16 11/10/2021   CREATININE 0.52 11/10/2021   BILITOT 0.4 11/10/2021   ALKPHOS 102 06/20/2016   AST 20 11/10/2021   ALT 20 11/10/2021   PROT 7.4 11/10/2021   ALBUMIN 3.7 06/20/2016   CALCIUM 9.6 11/10/2021    GFRAA 108 06/29/2020   QFTBGOLDPLUS NEGATIVE 03/29/2021    Speciality Comments: No specialty comments available.  Procedures:  No procedures performed Allergies: Patient has no known allergies.   Assessment / Plan:     Visit Diagnoses: Rheumatoid arthritis of multiple sites with negative rheumatoid factor  High risk medication use  History of total knee replacement, bilateral  DDD (degenerative disc disease), lumbar  History of hypertension  Dyslipidemia  History of vitamin D deficiency  History of diabetic neuropathy  History of diabetes mellitus  Language barrier  Orders: No orders of the defined types were placed in this encounter.  No orders of the defined types were placed in this encounter.   Face-to-face time spent with patient was *** minutes. Greater than 50% of time was spent in counseling and coordination of care.  Follow-Up Instructions: No follow-ups on file.   Ofilia Neas, PA-C  Note - This record has been created using Dragon software.  Chart creation errors have been sought, but may not always  have been located. Such creation errors do not reflect on  the standard of medical care.

## 2022-04-08 ENCOUNTER — Encounter: Payer: Medicare Other | Admitting: Physical Medicine and Rehabilitation

## 2022-04-08 ENCOUNTER — Other Ambulatory Visit (HOSPITAL_COMMUNITY): Payer: Self-pay

## 2022-04-08 NOTE — Progress Notes (Deleted)
Subjective:    Patient ID: Connie Ross, female    DOB: 10-04-1955, 67 y.o.   MRN: [id number]  HPI Pain Inventory Average Pain {NUMBERS; 0-10:5044} Pain Right Now {NUMBERS; 0-10:5044} My pain is {PAIN DESCRIPTION:21022940}  In the last 24 hours, has pain interfered with the following? General activity {NUMBERS; 0-10:5044} Relation with others {NUMBERS; 0-10:5044} Enjoyment of life {NUMBERS; 0-10:5044} What TIME of day is your pain at its worst? {time of day:24191} Sleep (in general) {BHH GOOD/FAIR/POOR:22877}  Pain is worse with: {ACTIVITIES:21022942} Pain improves with: {PAIN IMPROVES WUJW:11914782}WITH:21022943} Relief from Meds: {NUMBERS; 0-10:5044}  Family History  Adopted: Yes  Problem Relation Age of Onset   Cancer Mother        breast   Healthy Son    Healthy Daughter    Healthy Daughter    Social History   Socioeconomic History   Marital status: Married    Spouse name: Not on file   Number of children: Not on file   Years of education: Not on file   Highest education level: Not on file  Occupational History   Not on file  Tobacco Use   Smoking status: Never    Passive exposure: Never   Smokeless tobacco: Never  Vaping Use   Vaping Use: Never used  Substance and Sexual Activity   Alcohol use: No   Drug use: No   Sexual activity: Not Currently  Other Topics Concern   Not on file  Social History Narrative   Not on file   Social Determinants of Health   Financial Resource Strain: Not on file  Food Insecurity: Not on file  Transportation Needs: Not on file  Physical Activity: Not on file  Stress: Not on file  Social Connections: Not on file   Past Surgical History:  Procedure Laterality Date   EYE SURGERY     bilateral cataracts   KNEE ARTHROPLASTY     TOTAL KNEE ARTHROPLASTY Right 02/28/2013   TOTAL KNEE ARTHROPLASTY Right 02/27/2013   Procedure: TOTAL KNEE ARTHROPLASTY- knee;  Surgeon: Nadara MustardMarcus V Duda, MD;  [facility];  Service: Orthopedics;   Laterality: Right;  Right Total Knee Arthroplasty   TOTAL KNEE ARTHROPLASTY Left 07/04/2014   Procedure: LEFT TOTAL KNEE ARTHROPLASTY;  Surgeon: Nadara MustardMarcus Duda V, MD;  [facility];  Service: Orthopedics;  Laterality: Left;   Past Surgical History:  Procedure Laterality Date   EYE SURGERY     bilateral cataracts   KNEE ARTHROPLASTY     TOTAL KNEE ARTHROPLASTY Right 02/28/2013   TOTAL KNEE ARTHROPLASTY Right 02/27/2013   Procedure: TOTAL KNEE ARTHROPLASTY- knee;  Surgeon: Nadara MustardMarcus V Duda, MD;  [facility];  Service: Orthopedics;  Laterality: Right;  Right Total Knee Arthroplasty   TOTAL KNEE ARTHROPLASTY Left 07/04/2014   Procedure: LEFT TOTAL KNEE ARTHROPLASTY;  Surgeon: Nadara MustardMarcus Duda V, MD;  [facility];  Service: Orthopedics;  Laterality: Left;   Past Medical History:  Diagnosis Date   Anxiety    takes no meds   DVT (deep venous thrombosis) (HCC)    GERD (gastroesophageal reflux disease)    High cholesterol    Hypertension    Rheumatoid arthritis (HCC)    Type II diabetes mellitus (HCC)    LMP  (LMP Unknown)   Opioid Risk Score:   Fall Risk Score:  `1  [facility]     01/10/2022    2:53 PM 06/02/2021    1:30 PM 02/05/2021   11:03 AM 07/27/2020   10:41  AM 05/25/2020    2:38 PM  Depression screen PHQ 2/9  Decreased Interest 3 0 0 0 0  Down, Depressed, Hopeless 3 0 0 0 0  PHQ - 2 Score 6 0 0 0 0  Altered sleeping 1    0  Tired, decreased energy 3    0  Change in appetite 3    0  Feeling bad or failure about yourself  3    0  Trouble concentrating 0    0  Moving slowly or fidgety/restless 3    0  Suicidal thoughts 0    0  PHQ-9 Score 19    0  Difficult doing work/chores Extremely dIfficult          Review of Systems     Objective:   Physical Exam        Assessment & Plan:

## 2022-04-11 ENCOUNTER — Other Ambulatory Visit (HOSPITAL_COMMUNITY): Payer: Self-pay

## 2022-04-13 ENCOUNTER — Telehealth: Payer: Self-pay

## 2022-04-13 MED ORDER — HYDROCODONE-ACETAMINOPHEN 7.5-325 MG PO TABS: 1.0000 | ORAL_TABLET | Freq: Four times a day (QID) | ORAL | 0 refills | Status: DC | PRN

## 2022-04-13 NOTE — Telephone Encounter (Signed)
Daughter has been informed.

## 2022-04-13 NOTE — Telephone Encounter (Signed)
Refill request for Hydrocodone 7.5-325 MG.  PMP Report: Filled  Written  ID  Drug  QTY  Days  Prescriber  RX #  Dispenser  Refill  Daily Dose*  Pymt Type  PMP  03/16/2022 03/14/2022 2  Hydrocodone-Acetamin 7.5-325 120.00 30 Me Lov [id number] Nor ([id number]) 0/0 30.00 MME Comm Ins [place] 02/16/2022 02/16/2022 2  Hydrocodone-Acetamin 7.5-325 120.00 30 Me Lov [id number] Nor ([id number]) 0/0 30.00 MME Comm Ins [place]

## 2022-04-14 ENCOUNTER — Other Ambulatory Visit (HOSPITAL_COMMUNITY): Payer: Self-pay

## 2022-04-14 DIAGNOSIS — I469 Cardiac arrest, cause unspecified: Secondary | ICD-10-CM | POA: Diagnosis not present

## 2022-04-14 DIAGNOSIS — I499 Cardiac arrhythmia, unspecified: Secondary | ICD-10-CM | POA: Diagnosis not present

## 2022-04-14 DIAGNOSIS — R404 Transient alteration of awareness: Secondary | ICD-10-CM | POA: Diagnosis not present

## 2022-04-19 ENCOUNTER — Other Ambulatory Visit (HOSPITAL_COMMUNITY): Payer: Self-pay

## 2022-04-19 ENCOUNTER — Ambulatory Visit: Payer: BC Managed Care – PPO | Admitting: Physician Assistant

## 2022-04-19 DIAGNOSIS — Z79899 Other long term (current) drug therapy: Secondary | ICD-10-CM

## 2022-04-19 DIAGNOSIS — M0609 Rheumatoid arthritis without rheumatoid factor, multiple sites: Secondary | ICD-10-CM

## 2022-04-19 DIAGNOSIS — Z8679 Personal history of other diseases of the circulatory system: Secondary | ICD-10-CM

## 2022-04-19 DIAGNOSIS — E785 Hyperlipidemia, unspecified: Secondary | ICD-10-CM

## 2022-04-19 DIAGNOSIS — Z96653 Presence of artificial knee joint, bilateral: Secondary | ICD-10-CM

## 2022-04-19 DIAGNOSIS — M5136 Other intervertebral disc degeneration, lumbar region: Secondary | ICD-10-CM

## 2022-04-19 DIAGNOSIS — Z8639 Personal history of other endocrine, nutritional and metabolic disease: Secondary | ICD-10-CM

## 2022-04-19 DIAGNOSIS — Z758 Other problems related to medical facilities and other health care: Secondary | ICD-10-CM

## 2022-04-26 ENCOUNTER — Other Ambulatory Visit (HOSPITAL_COMMUNITY): Payer: Self-pay

## 2022-05-04 DEATH — deceased

## 2022-05-11 ENCOUNTER — Ambulatory Visit: Payer: BLUE CROSS/BLUE SHIELD | Admitting: Registered Nurse

## 2022-07-01 ENCOUNTER — Ambulatory Visit: Payer: BC Managed Care – PPO | Admitting: Physical Medicine and Rehabilitation

## 2022-08-02 ENCOUNTER — Other Ambulatory Visit (HOSPITAL_COMMUNITY): Payer: Self-pay

## 2023-10-19 IMAGING — MR MR LUMBAR SPINE W/O CM
4 of 5 series · 24 of 48 positions shown · non-contrast
Comparison: Lumbar spine MRI 08/08/2015

CLINICAL DATA: Lumbar radiculopathy, symptoms persist with > 6 wks
treatment last MRI shows bad foraminal stenosis B/L L5/S1 and L4/5-
needs f/u- more lumbar flexion wiht gait and reduced distance she
can walk

EXAM:
MRI LUMBAR SPINE WITHOUT CONTRAST
TECHNIQUE: Multiplanar, multisequence MR imaging of the lumbar spine was
performed. No intravenous contrast was administered.

[Series 3: T2 · sagittal · 4.0mm · 0.53mm/px · 7 of 15 slices shown (1 of 2)]
[im 1/15]
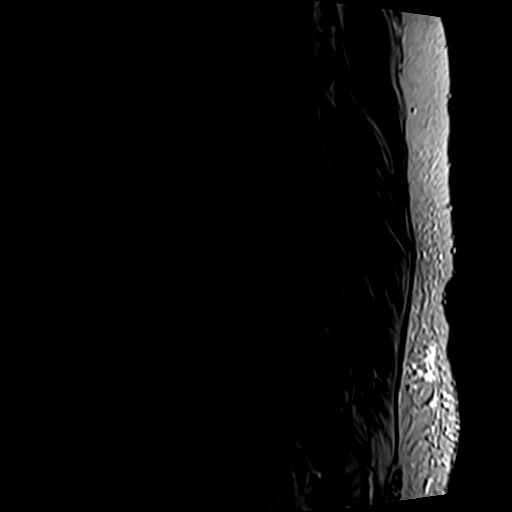
[im 3/15]
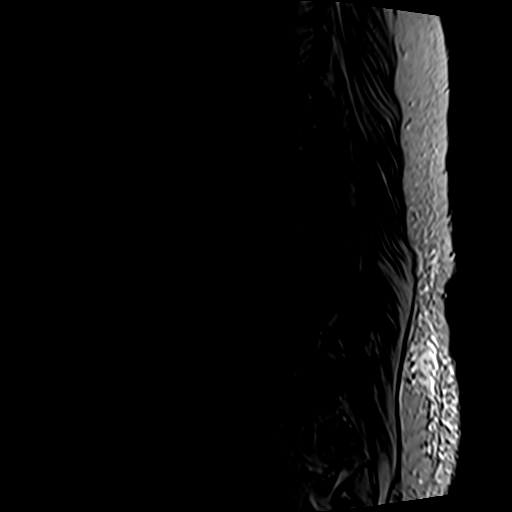
[im 5/15]
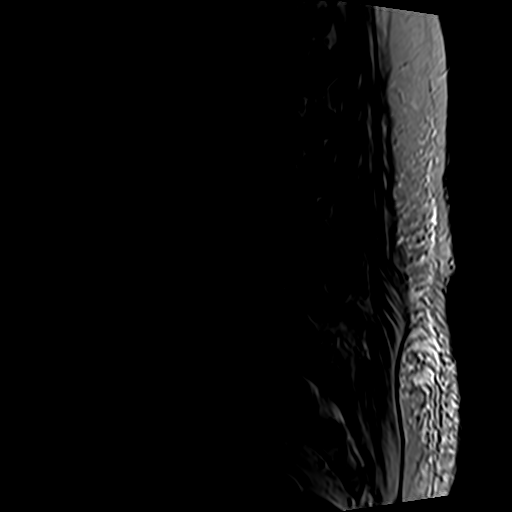
[im 8/15]
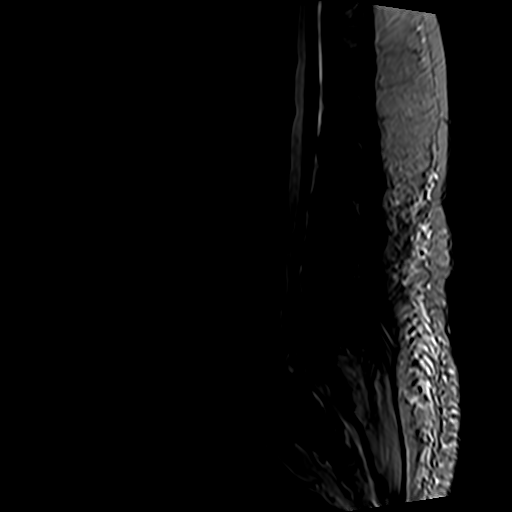
[im 10/15]
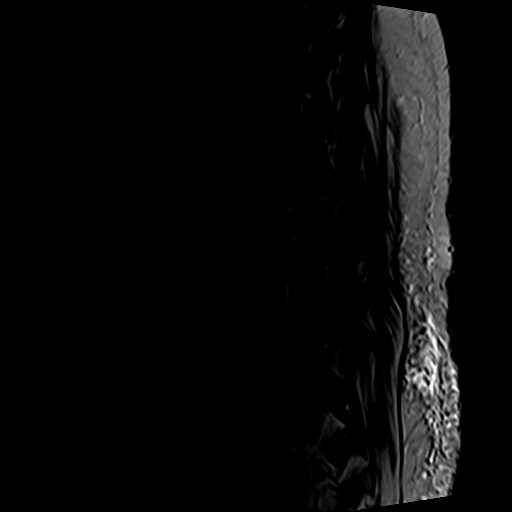
[im 12/15]
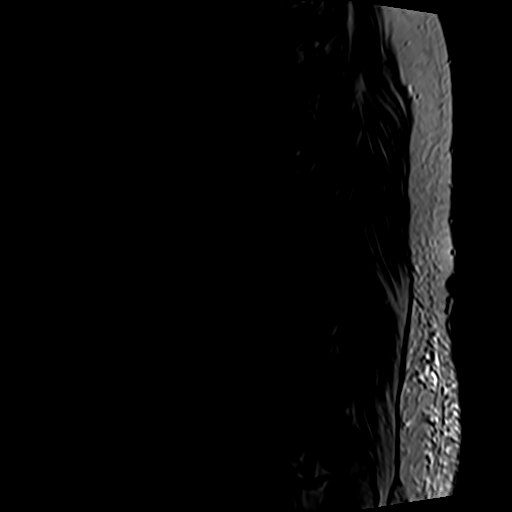
[im 15/15]
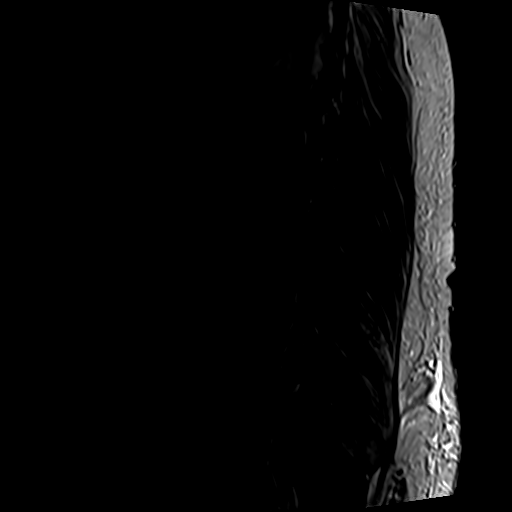

[Series 5: T1 · sagittal · 4.0mm · 0.53mm/px · 6 of 15 slices shown (1 of 2)]
[im 1/15]
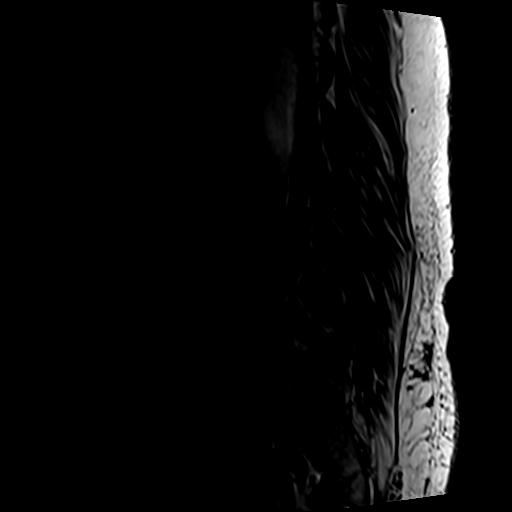
[im 3/15]
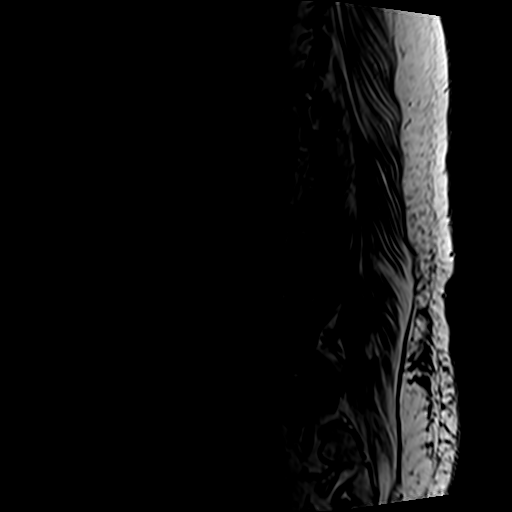
[im 6/15]
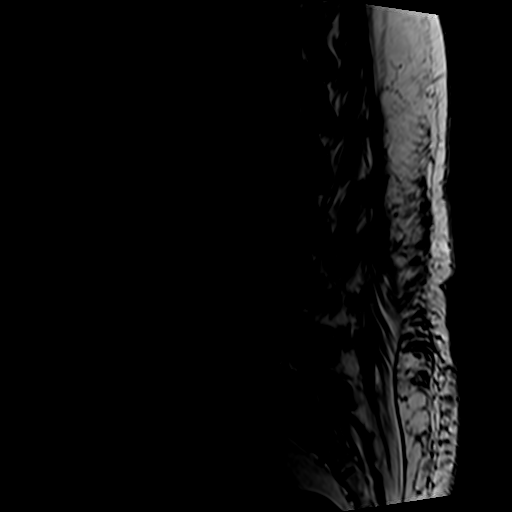
[im 9/15]
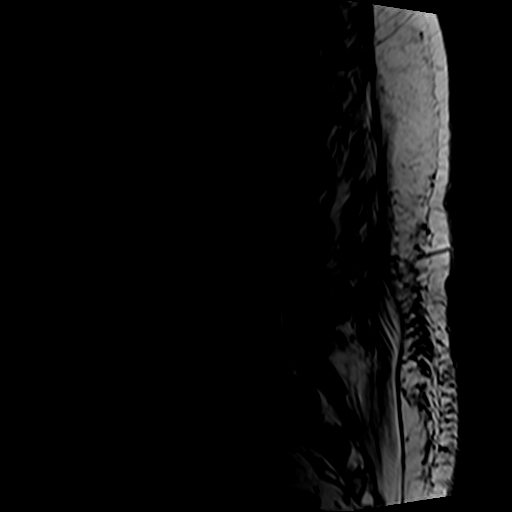
[im 12/15]
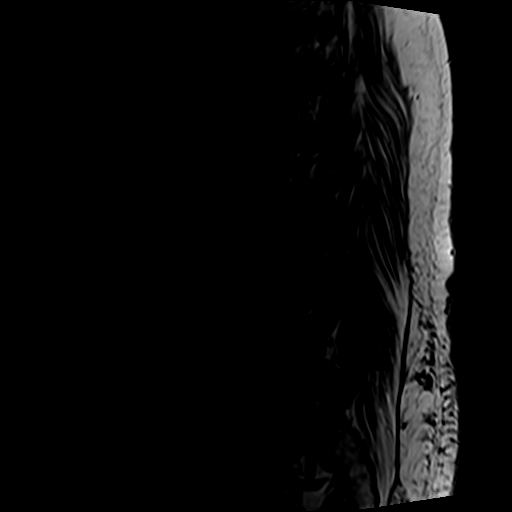
[im 15/15]
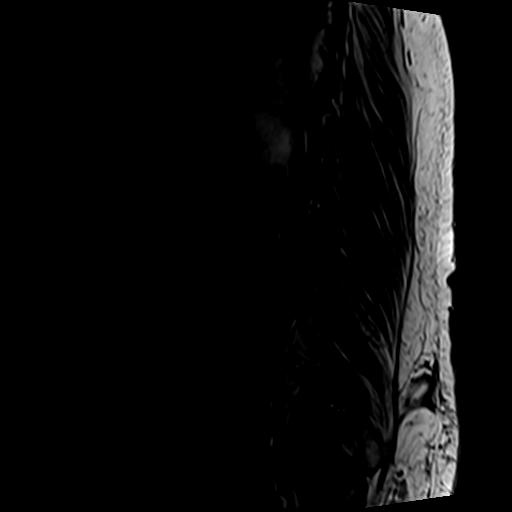

[Series 6: T2 · axial · 4.0mm · 0.70mm/px · z∈[+24,+215]mm · 8 of 33 slices shown (2 of 2)]
[im 1/33]
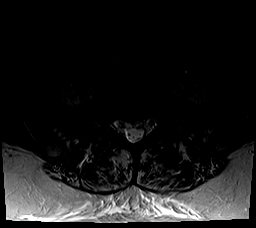
[im 5/33]
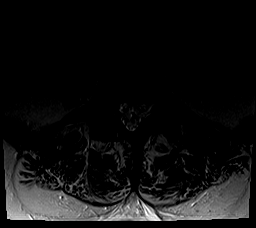
[im 10/33]
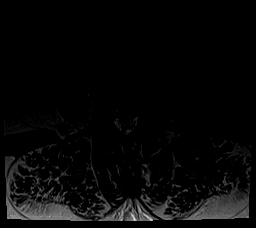
[im 15/33]
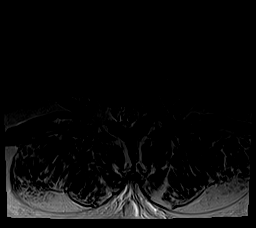
[im 18/33]
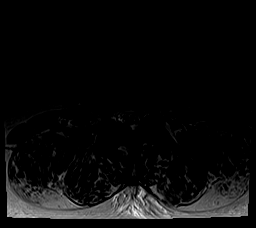
[im 23/33]
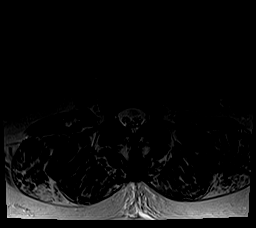
[im 28/33]
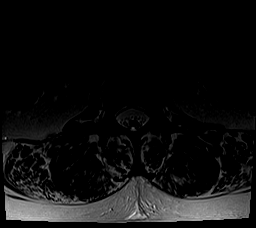
[im 33/33]
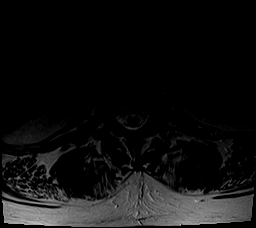

[Series 7: T1 · axial · 4.0mm · 0.35mm/px · z∈[+44,+189]mm · 3 of 33 slices shown (2 of 2)]
[im 5/33]
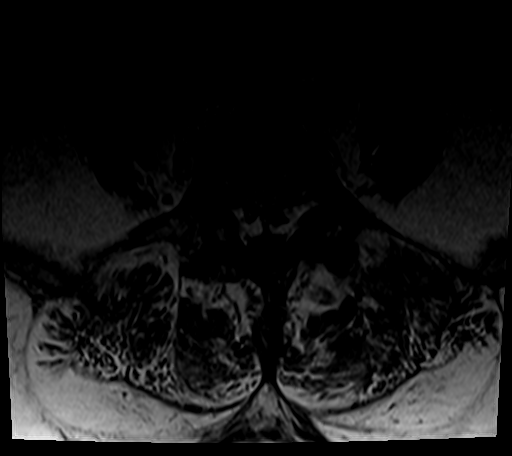
[im 18/33]
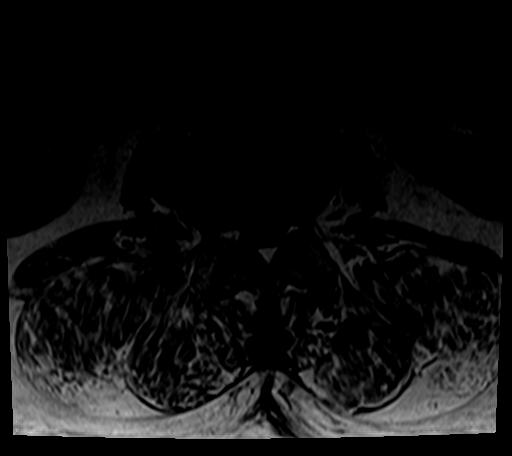
[im 28/33]
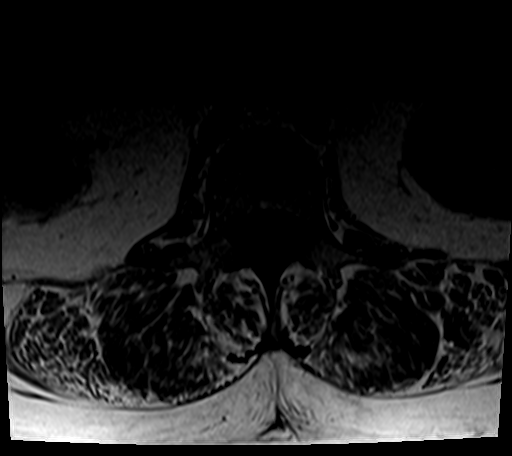

[24 of 48 positions shown; findings below may reference images not displayed]

FINDINGS: Segmentation:  Standard.

Alignment: Degenerative grade 1 anterolisthesis at L5-S1. Trace
anterolisthesis at L4-L5.

Vertebrae: No fracture, evidence of discitis, or aggressive bone
lesion.

Conus medullaris and cauda equina: Conus extends to the L1 level.
Conus and cauda equina appear normal.

Paraspinal and other soft tissues: Small right renal cysts which
require no follow-up. Mild paraspinal muscle atrophy.

Disc levels:

T11-T12: Minimal disc bulging.  No significant stenosis.

T12-L1: Minimal disc bulging.  No significant stenosis.

L1-L2: No significant stenosis.

L2-L3: There is broad-based disc bulging, ligamentum hypertrophy
mild facet arthropathy resulting and mild spinal canal stenosis,
unchanged from prior exam. No neural foraminal stenosis.

L3-L4: There is minimal disc bulging, ligament flavum hypertrophy
mild facet arthropathy. There is minimal spinal canal narrowing,
unchanged.

L4-L5: Broad-based disc bulging, ligament flavum hypertrophy mild
facet arthropathy. There is moderate spinal canal stenosis with
right greater than left subarticular stenosis and potential for
impingement of the descending right L5 nerve root. There is
moderate-severe bilateral neural foraminal stenosis, similar to
prior exam.

L5-S1: Grade 1 anterolisthesis with disc uncovering, mild disc
bulging, ligament flavum hypertrophy and bilateral facet
arthropathy. No significant spinal canal stenosis. There is
moderate-severe bilateral neural foraminal stenosis, unchanged.
IMPRESSION: Multilevel degenerative changes of the lumbar spine, overall similar
to prior exam.

L4-L5: Moderate spinal canal stenosis with right greater than left
subarticular stenosis and potential for impingement of the
descending right L5 nerve root. Moderate-severe bilateral neural
foraminal stenosis, unchanged from prior.

L5-S1: Grade 1 degenerative anterolisthesis, similar to prior with
advanced bilateral facet arthropathy. Moderate-severe bilateral
neural foraminal stenosis, unchanged from prior.

Additional milder degenerative findings as described above.
